# Patient Record
Sex: Male | Born: 1970
Health system: Southern US, Community
[De-identification: ages and names within clinical notes are randomized; demographics above are authoritative.]

## PROBLEM LIST (undated history)

## (undated) DIAGNOSIS — J45909 Unspecified asthma, uncomplicated: Secondary | ICD-10-CM

## (undated) DIAGNOSIS — I10 Essential (primary) hypertension: Secondary | ICD-10-CM

## (undated) DIAGNOSIS — E785 Hyperlipidemia, unspecified: Secondary | ICD-10-CM

## (undated) DIAGNOSIS — E669 Obesity, unspecified: Secondary | ICD-10-CM

## (undated) DIAGNOSIS — K200 Eosinophilic esophagitis: Secondary | ICD-10-CM

## (undated) DIAGNOSIS — E119 Type 2 diabetes mellitus without complications: Secondary | ICD-10-CM

## (undated) DIAGNOSIS — F419 Anxiety disorder, unspecified: Secondary | ICD-10-CM

## (undated) DIAGNOSIS — K760 Fatty (change of) liver, not elsewhere classified: Secondary | ICD-10-CM

## (undated) DIAGNOSIS — N189 Chronic kidney disease, unspecified: Secondary | ICD-10-CM

## (undated) DIAGNOSIS — Z8614 Personal history of Methicillin resistant Staphylococcus aureus infection: Secondary | ICD-10-CM

## (undated) HISTORY — DX: Type 2 diabetes mellitus without complications: E11.9

## (undated) HISTORY — DX: Eosinophilic esophagitis: K20.0

## (undated) HISTORY — PX: BACK SURGERY: SHX140

## (undated) HISTORY — DX: Essential (primary) hypertension: I10

## (undated) HISTORY — PX: OTHER SURGICAL HISTORY: SHX169

## (undated) HISTORY — DX: Fatty (change of) liver, not elsewhere classified: K76.0

## (undated) HISTORY — DX: Chronic kidney disease, unspecified: N18.9

## (undated) HISTORY — DX: Personal history of Methicillin resistant Staphylococcus aureus infection: Z86.14

## (undated) HISTORY — DX: Anxiety disorder, unspecified: F41.9

## (undated) HISTORY — DX: Obesity, unspecified: E66.9

## (undated) HISTORY — DX: Hyperlipidemia, unspecified: E78.5

## (undated) HISTORY — DX: Unspecified asthma, uncomplicated: J45.909

## (undated) HISTORY — PX: SHOULDER SURGERY: SHX246

## (undated) HISTORY — PX: ESOPHAGEAL DILATION: SHX303

---

## 2007-08-30 ENCOUNTER — Ambulatory Visit: Payer: Self-pay | Admitting: Family Medicine

## 2007-08-30 DIAGNOSIS — K222 Esophageal obstruction: Secondary | ICD-10-CM | POA: Insufficient documentation

## 2007-09-10 ENCOUNTER — Ambulatory Visit: Payer: Self-pay | Admitting: Gastroenterology

## 2007-09-17 ENCOUNTER — Ambulatory Visit: Payer: Self-pay | Admitting: Gastroenterology

## 2007-09-17 ENCOUNTER — Encounter: Payer: Self-pay | Admitting: Family Medicine

## 2007-09-17 ENCOUNTER — Encounter: Payer: Self-pay | Admitting: Gastroenterology

## 2007-10-05 ENCOUNTER — Ambulatory Visit: Payer: Self-pay | Admitting: Gastroenterology

## 2007-10-05 LAB — CONVERTED CEMR LAB
ALT: 80 units/L — ABNORMAL HIGH (ref 0–53)
AST: 47 units/L — ABNORMAL HIGH (ref 0–37)
Albumin: 4 g/dL (ref 3.5–5.2)
Alkaline Phosphatase: 64 units/L (ref 39–117)
BUN: 16 mg/dL (ref 6–23)
Basophils Absolute: 0.1 10*3/uL (ref 0.0–0.1)
Basophils Relative: 0.9 % (ref 0.0–1.0)
Bilirubin, Direct: 0.1 mg/dL (ref 0.0–0.3)
CO2: 30 meq/L (ref 19–32)
Calcium: 9.3 mg/dL (ref 8.4–10.5)
Chloride: 104 meq/L (ref 96–112)
Creatinine, Ser: 1.1 mg/dL (ref 0.4–1.5)
Eosinophils Absolute: 0.6 10*3/uL (ref 0.0–0.6)
Eosinophils Relative: 6.6 % — ABNORMAL HIGH (ref 0.0–5.0)
GFR calc Af Amer: 97 mL/min
GFR calc non Af Amer: 80 mL/min
Glucose, Bld: 143 mg/dL — ABNORMAL HIGH (ref 70–99)
HCT: 44.4 % (ref 39.0–52.0)
Hemoglobin: 15.1 g/dL (ref 13.0–17.0)
Lymphocytes Relative: 25.1 % (ref 12.0–46.0)
MCHC: 34 g/dL (ref 30.0–36.0)
MCV: 90.3 fL (ref 78.0–100.0)
Monocytes Absolute: 0.7 10*3/uL (ref 0.2–0.7)
Monocytes Relative: 7.4 % (ref 3.0–11.0)
Neutro Abs: 5.7 10*3/uL (ref 1.4–7.7)
Neutrophils Relative %: 60 % (ref 43.0–77.0)
Platelets: 325 10*3/uL (ref 150–400)
Potassium: 3.8 meq/L (ref 3.5–5.1)
RBC: 4.91 M/uL (ref 4.22–5.81)
RDW: 11.3 % — ABNORMAL LOW (ref 11.5–14.6)
Sodium: 141 meq/L (ref 135–145)
TSH: 1.61 microintl units/mL (ref 0.35–5.50)
Total Bilirubin: 0.6 mg/dL (ref 0.3–1.2)
Total Protein: 7.4 g/dL (ref 6.0–8.3)
WBC: 9.5 10*3/uL (ref 4.5–10.5)

## 2007-10-13 ENCOUNTER — Ambulatory Visit (HOSPITAL_COMMUNITY): Admission: RE | Admit: 2007-10-13 | Discharge: 2007-10-13 | Payer: Self-pay | Admitting: Gastroenterology

## 2007-10-29 ENCOUNTER — Encounter: Payer: Self-pay | Admitting: Family Medicine

## 2007-11-01 ENCOUNTER — Ambulatory Visit: Payer: Self-pay | Admitting: Family Medicine

## 2007-11-01 DIAGNOSIS — I10 Essential (primary) hypertension: Secondary | ICD-10-CM | POA: Insufficient documentation

## 2007-11-01 DIAGNOSIS — K7689 Other specified diseases of liver: Secondary | ICD-10-CM | POA: Insufficient documentation

## 2007-11-01 DIAGNOSIS — E669 Obesity, unspecified: Secondary | ICD-10-CM | POA: Insufficient documentation

## 2007-12-13 ENCOUNTER — Ambulatory Visit: Payer: Self-pay | Admitting: Family Medicine

## 2007-12-24 ENCOUNTER — Telehealth: Payer: Self-pay | Admitting: Family Medicine

## 2009-02-13 ENCOUNTER — Encounter: Payer: Self-pay | Admitting: Family Medicine

## 2009-02-23 ENCOUNTER — Ambulatory Visit: Payer: Self-pay | Admitting: Family Medicine

## 2009-02-28 ENCOUNTER — Ambulatory Visit: Payer: Self-pay | Admitting: Family Medicine

## 2009-03-01 ENCOUNTER — Telehealth: Payer: Self-pay | Admitting: Family Medicine

## 2009-03-01 ENCOUNTER — Encounter: Payer: Self-pay | Admitting: Family Medicine

## 2009-04-07 ENCOUNTER — Ambulatory Visit: Payer: Self-pay | Admitting: Family Medicine

## 2009-04-07 LAB — CONVERTED CEMR LAB
Glucose, Urine, Semiquant: NEGATIVE
Ketones, urine, test strip: NEGATIVE
Nitrite: NEGATIVE
Protein, U semiquant: 100
Rapid Strep: NEGATIVE
Specific Gravity, Urine: 1.025
Urobilinogen, UA: 1
WBC Urine, dipstick: NEGATIVE
pH: 6.5

## 2009-04-08 ENCOUNTER — Encounter: Payer: Self-pay | Admitting: Family Medicine

## 2009-09-10 ENCOUNTER — Ambulatory Visit: Payer: Self-pay | Admitting: Family Medicine

## 2009-11-01 ENCOUNTER — Ambulatory Visit: Payer: Self-pay | Admitting: Family Medicine

## 2009-11-01 DIAGNOSIS — S8010XA Contusion of unspecified lower leg, initial encounter: Secondary | ICD-10-CM | POA: Insufficient documentation

## 2010-02-21 ENCOUNTER — Ambulatory Visit: Payer: Self-pay | Admitting: Family Medicine

## 2010-02-21 DIAGNOSIS — S93409A Sprain of unspecified ligament of unspecified ankle, initial encounter: Secondary | ICD-10-CM | POA: Insufficient documentation

## 2010-03-10 ENCOUNTER — Ambulatory Visit: Payer: Self-pay | Admitting: Emergency Medicine

## 2010-03-10 DIAGNOSIS — L03319 Cellulitis of trunk, unspecified: Secondary | ICD-10-CM

## 2010-03-10 DIAGNOSIS — L02219 Cutaneous abscess of trunk, unspecified: Secondary | ICD-10-CM | POA: Insufficient documentation

## 2010-03-12 ENCOUNTER — Ambulatory Visit: Payer: Self-pay | Admitting: Emergency Medicine

## 2010-03-14 ENCOUNTER — Ambulatory Visit: Payer: Self-pay | Admitting: Family Medicine

## 2010-03-14 ENCOUNTER — Telehealth (INDEPENDENT_AMBULATORY_CARE_PROVIDER_SITE_OTHER): Payer: Self-pay | Admitting: *Deleted

## 2010-03-15 ENCOUNTER — Encounter: Payer: Self-pay | Admitting: Family Medicine

## 2010-05-28 ENCOUNTER — Ambulatory Visit: Payer: Self-pay | Admitting: Family Medicine

## 2010-05-28 DIAGNOSIS — M20019 Mallet finger of unspecified finger(s): Secondary | ICD-10-CM | POA: Insufficient documentation

## 2010-05-30 ENCOUNTER — Encounter: Admission: RE | Admit: 2010-05-30 | Discharge: 2010-05-30 | Payer: Self-pay | Admitting: Sports Medicine

## 2010-05-30 ENCOUNTER — Encounter: Payer: Self-pay | Admitting: Family Medicine

## 2010-06-18 ENCOUNTER — Telehealth: Payer: Self-pay | Admitting: Family Medicine

## 2010-08-21 ENCOUNTER — Ambulatory Visit: Admit: 2010-08-21 | Payer: Self-pay | Admitting: Family Medicine

## 2010-08-23 ENCOUNTER — Ambulatory Visit
Admission: RE | Admit: 2010-08-23 | Discharge: 2010-08-23 | Payer: Self-pay | Source: Home / Self Care | Attending: Family Medicine | Admitting: Family Medicine

## 2010-08-23 DIAGNOSIS — L03119 Cellulitis of unspecified part of limb: Secondary | ICD-10-CM

## 2010-08-23 DIAGNOSIS — L02419 Cutaneous abscess of limb, unspecified: Secondary | ICD-10-CM | POA: Insufficient documentation

## 2010-08-24 ENCOUNTER — Encounter: Payer: Self-pay | Admitting: Family Medicine

## 2010-09-17 NOTE — Assessment & Plan Note (Signed)
Summary: CPE   Vital Signs:  Patient profile:   40 year old male Height:      67.25 inches Weight:      253 pounds BMI:     39.47 O2 Sat:      100 % on Room air Temp:     98.4 degrees F oral Pulse rate:   63 / minute BP sitting:   139 / 91  (left arm) Cuff size:   regular  Vitals Entered By: Payton Spark CMA (September 10, 2009 3:34 PM)  O2 Flow:  Room air CC: CPE   Primary Care Provider:  Seymour Bars DO  CC:  CPE.  History of Present Illness: 40 yo WM with hx of depression presents for CPE.  Doing well.  Mood stable on Zoloft.  Due for a RF.  He has just started to work on healthy diet and exercise, down 7 lbs this month.  He is not on BP meds.  He is due for fasting labs.  Denies fam hx of premature heart dz, colon or prostate cancer.  he is taking [employer] Men's Mega MVI daily.    Drinks 4-5 caffienated drinks/day. Only drinks diet soda.  He drinks a 6pk of beer 2-3/week.  He identifies his job as stressful and would like to have xanax as needed for anxiety.  He reports a Tick bite 4 mos ago while walking through the woods. Several days later he discovered the head still under his skin. He removed the tick but is still concerned about an Itchy area on his left side.  He reports no rash.  No fevers or joint pain.    Current Medications (verified): 1)  Zoloft 100 Mg  Tabs (Sertraline Hcl) .... Take 1 Tablet By Mouth Once A Day  Allergies (verified): 1)  ! Pcn  Past History:  Past Medical History: Reviewed history from 04/07/2009 and no changes required. anxiety/ anger problems HTN (off meds) obesity fatty liver (2-09 ) + [place] eosinophilic esophagitis recurrent esophageal stricture history of MRSA - rt arm  Past Surgical History: Reviewed history from 11/01/2007 and no changes required. R ankle ORIF 1995 EGD ([facility] 1-09), stricture dilatation  Family History: Reviewed history from 08/30/2007 and no changes required. mother alive- HTN, stroke at 70 father  alive, HTN 2 sisters obese 1 brother healthy  Social History: Reviewed history from 12/13/2007 and no changes required. 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 Finished 2 yrs college. Never smoked. 6 pack beer 2-3x/ wk.-- cutting back No exercise.  Review of Systems       The patient complains of weight gain.  The patient denies anorexia, fever, weight loss, vision loss, decreased hearing, hoarseness, chest pain, syncope, dyspnea on exertion, peripheral edema, prolonged cough, headaches, hemoptysis, abdominal pain, melena, hematochezia, severe indigestion/heartburn, hematuria, incontinence, genital sores, muscle weakness, suspicious skin lesions, transient blindness, difficulty walking, depression, unusual weight change, abnormal bleeding, enlarged lymph nodes, angioedema, breast masses, and testicular masses.    Physical Exam  General:  alert, well-developed, cooperative to examination, and overweight-appearing.   Head:  normocephalic and atraumatic.   Eyes:  vision grossly intact, pupils equal, pupils round, and pupils reactive to light.   Ears:  EACs patent; TMs translucent and gray with good cone of light and bony landmarks.   Nose:  no external deformity.   Mouth:  good dentition, pharynx pink and moist, no erythema, no exudates, no posterior lymphoid hypertrophy, and no postnasal drip.   Neck:  supple, full ROM, and no masses.   Chest Wall:  no deformities, no tenderness, and no masses.   Lungs:  normal respiratory effort, no intercostal retractions, no accessory muscle use, normal breath sounds, no crackles, and no wheezes.   Heart:  normal rate, regular rhythm, and no murmur.   Abdomen:  soft, non-tender, normal bowel sounds, no guarding, and no rigidity.   Pulses:  R radial normal, R dorsalis pedis normal, R carotid normal, L radial normal, L dorsalis pedis normal, and L carotid normal.   Extremities:  trace ankle edema bilat Neurologic:  alert &  oriented X3 and gait normal.   Skin:  color normal.    ~0.5 cm erythematous insect bite area over L flank.  Nontender.  No edema.  scaling.   Cervical Nodes:  No lymphadenopathy noted Psych:  Oriented X3, normally interactive, good eye contact, not anxious appearing, and not depressed appearing.     Impression & Recommendations:  Problem # 1:  HEALTH MAINTENANCE EXAM (ICD-V70.0) Keeping healthy checklist for men reviewed.   BP high today.  Will have him work on diet, exercise and wt loss and recheck in 3 mos.  If still high, add medication. BMI 39 c/w class II obesity.  Work on diet,exercise, wt loss. Update fasting labs.  Tdap is UTD. Zoloft RFd with prn Alprazolam RX given. Local wound care for tick bite area.  No sign of infection locally or systemically.    Complete Medication List: 1)  Zoloft 100 Mg Tabs (Sertraline hcl) .... Take 1 tablet by mouth once a day 2)  Alprazolam 0.5 Mg Tabs (Alprazolam) .... 1/2 to 1 tab by mouth daily as needed for anxiety  Other Orders: T-Comprehensive Metabolic Panel [id number]) T-Lipid Profile ([id number])  Patient Instructions: 1)  Zoloft RFd. 2)  Alprazolam - use sparingly for anxiety.  Do not combine with alcohol. 3)  Have fasting labs updated. 4)  Will call you w/ results. 5)  Work on [employer], regular exercise and wt loss. 6)  Clean tick bite area with dial soap and water daily, apply neosporin to area daily. 7)  Return for f/u in 3 mos. Prescriptions: ALPRAZOLAM 0.5 MG TABS (ALPRAZOLAM) 1/2 to 1 tab by mouth daily as needed for anxiety  #30 x 0   Entered and Authorized by:   Seymour Bars DO   Signed by:   Seymour Bars DO on 09/10/2009   Method used:   Printed then faxed to ...       [address] [phone]* (retail)       [address] [place], [place]  [address]       Ph: [phone]       Fax: [phone]   RxID:   [id number] ZOLOFT 100 MG  TABS (SERTRALINE HCL) Take 1 tablet by mouth once a day  #30 x 5   Entered  and Authorized by:   Seymour Bars DO   Signed by:   Seymour Bars DO on 09/10/2009   Method used:   Electronically to        [facility] [phone]* (retail)       [address] [place], [place]  [address]       Ph: [phone]       Fax: [phone]   RxID:   [id number]

## 2010-09-17 NOTE — Assessment & Plan Note (Signed)
Summary: I&D   Vital Signs:  Patient profile:   39 year old male Height:      67.25 inches Weight:      254 pounds Pulse rate:   85 / minute BP sitting:   142 / 91  (left arm) Cuff size:   regular  Vitals Entered By: Avon Gully CMA, Duncan Dull) (March 14, 2010 2:27 PM) CC: Cyst on rt flank   Primary Care Provider:  Seymour Bars DO  CC:  Cyst on rt flank.  History of Present Illness: 6 days started. Put on Bactrim 4 a day.  Told likely needs I & D if not improved by Today. Says the redness had extended out several inches and that has improved since being on the ABX. Was drainaing initially but has stopped.  No fever. No other lesions. Prior hx of MRSA.   Current Medications (verified): 1)  Zoloft 100 Mg  Tabs (Sertraline Hcl) .... Take 1 Tablet By Mouth Once A Day 2)  Bactrim Ds 800-160 Mg Tabs (Sulfamethoxazole-Trimethoprim) .... 2 Tabs By Mouth Two Times A Day X 10 Days 3)  Bactroban Nasal 2 % Oint (Mupirocin Calcium) .... Apply Daily To Both Nares For 1 Week  Allergies (verified): 1)  ! Pcn  Comments:  Nurse/Medical Assistant: The patient's medications and allergies were reviewed with the patient and were updated in the Medication and Allergy Lists. Avon Gully CMA, Duncan Dull) (March 14, 2010 2:28 PM)  Physical Exam  General:  Well-developed,well-nourished,in no acute distress; alert,appropriate and cooperative throughout examination Skin:  Right flank with 3 cm indurated abscess. No active drianage. During I & D expressed copious yellow pus and was cultured.    Impression & Recommendations:  Problem # 1:  CELLULITIS AND ABSCESS OF TRUNK (ICD-682.2) complete ABX. Culture sent F/U wond care reviewed.  Pt tolerated procedure well.  Hydrocodone given for pain.  His updated medication list for this problem includes:    Bactrim Ds 800-160 Mg Tabs (Sulfamethoxazole-trimethoprim) .Marland Kitchen... 2 tabs by mouth two times a day x 10 days  Orders: T-Culture, Wound  ([id number]) I&D Abscess, Simple / Single ([id number])  Complete Medication List: 1)  Zoloft 100 Mg Tabs (Sertraline hcl) .... Take 1 tablet by mouth once a day 2)  Bactrim Ds 800-160 Mg Tabs (Sulfamethoxazole-trimethoprim) .... 2 tabs by mouth two times a day x 10 days 3)  Bactroban Nasal 2 % Oint (Mupirocin calcium) .... Apply daily to both nares for 1 week 4)  Hydrocodone-acetaminophen 5-500 Mg Tabs (Hydrocodone-acetaminophen) .Marland Kitchen.. 1-2 tabs by mouth every 4-6 hours as needed  Patient Instructions: 1)  Complete all the antibiotic 2)  Pull 1/2- 1 inch of gauze a day. Keep covered. Can apply vaseline to keep the bandage from sticking to the gauze.   3)  Call if redness increase or poor wound healing.  4)  F/U in 10 days to make sure healing well.   Procedure Note  Incision & Drainage: Onset of lesion: 6 days Indication: inflamed lesion  Procedure # 1: I & D with packing    Size (in cm): 3 .0 x 3.0    Region: right flank     Instrument used: #10 blade    Anesthesia: 1% lidocaine w/epinephrine  Cleaned and prepped with: alcohol and betadine Wound dressing: bulky gauze dressing Additional Instructions: wound packed wiht 1/4 inch iodoform.   Prescriptions: HYDROCODONE-ACETAMINOPHEN 5-500 MG TABS (HYDROCODONE-ACETAMINOPHEN) 1-2 tabs by mouth every 4-6 hours as needed  #40 x 0   Entered and Authorized by:  Nani Gasser MD   Signed by:   Nani Gasser MD on 03/14/2010   Method used:   Print then Give to Patient   RxID:   [id number]

## 2010-09-17 NOTE — Progress Notes (Signed)
Summary: Alprazolam refill  Phone Note Refill Request   Refills Requested: Medication #1:  Alprazolam 0.5mg  Take 1/2 -1 tab po qd prn anxiety   Last Refilled: 09/14/2009 Removed from med list by Sue Lush in July but now got refill request.  Initial call taken by: Payton Spark CMA,  June 18, 2010 10:40 AM    New/Updated Medications: ALPRAZOLAM 0.5 MG TABS (ALPRAZOLAM) 1 tab by mouth once daily as needed for anxiety Prescriptions: ALPRAZOLAM 0.5 MG TABS (ALPRAZOLAM) 1 tab by mouth once daily as needed for anxiety  #24 x 0   Entered and Authorized by:   Seymour Bars DO   Signed by:   Seymour Bars DO on 06/18/2010   Method used:   Printed then faxed to ...       [address] [phone]* (retail)       [address] [place], [place]  [address]       Ph: [phone]       Fax: [phone]   RxID:   [id number]   Appended Document: Alprazolam refill Faxed to [facility]

## 2010-09-17 NOTE — Progress Notes (Signed)
Summary: MRSA  Phone Note Call from Patient   Caller: [phone] Summary of Call: Pt states he has been seem in [facility] 7/24 and 7/26 for MRSA. Pt states UC would not do I&D and Pt feels that is what he needs. Pt is going out of town and really wants this looked at by Dr. B or Dr. Judie Petit. I explained to Pt that there are no available apts and if UC didn't think this needed to be drained then most likely it doesn't need to be drained. Pt insisted that I speak w/ both doctors and find out what can be done for him. Please advise. Initial call taken by: Payton Spark CMA,  March 14, 2010 11:43 AM  Follow-up for Phone Call        If UC felt it didn't need to be drained, it was probably becuase it was too soft.  Is he at least on antibiotics? Follow-up by: Seymour Bars DO,  March 14, 2010 11:52 AM  Additional Follow-up for Phone Call Additional follow up Details #1::        He is on ABX but states that the boil is no longer soft and needs drained. Additional Follow-up by: Payton Spark CMA,  March 14, 2010 12:02 PM    Additional Follow-up for Phone Call Additional follow up Details #2::    Can double book at 2:30.  Follow-up by: Nani Gasser MD,  March 14, 2010 12:08 PM  Additional Follow-up for Phone Call Additional follow up Details #3:: Details for Additional Follow-up Action Taken: Pt aware of the above Additional Follow-up by: Payton Spark CMA,  March 14, 2010 12:08 PM

## 2010-09-17 NOTE — Assessment & Plan Note (Signed)
Summary: R ankle sprain   Vital Signs:  Patient profile:   40 year old male Height:      67.25 inches Weight:      249 pounds BMI:     38.85 O2 Sat:      100 % on Room air Pulse rate:   67 / minute BP sitting:   149 / 80  (left arm) Cuff size:   large  Vitals Entered By: Payton Spark CMA (February 21, 2010 11:38 AM)  O2 Flow:  Room air CC: Twisted R ankle x 4 days. Also c/o pinched nerve in neck x months.   Primary Care Provider:  Seymour Bars DO  CC:  Twisted R ankle x 4 days. Also c/o pinched nerve in neck x months..  History of Present Illness: 40 yo WM presents for R ankle pain 4 days ago.  He was on a ramp and stepped into a hole.  He has a metal plate in their.  He had pain with weight bearing which has improved.  He has throbbing even when it turns outward.  He had some swelling.  He has been taking Ibuprofen.  He has been trying to elevate it some.   He has has pain in his neck when he turns his neck to the R or L.  The pain just shoots down the C spine.  Denies tingling or numbness in the arm.  Denies HAs.  Has an appt with his chiropractor tomorrow.       Current Medications (verified): 1)  Zoloft 100 Mg  Tabs (Sertraline Hcl) .... Take 1 Tablet By Mouth Once A Day 2)  Alprazolam 0.5 Mg Tabs (Alprazolam) .... 1/2 To 1 Tab By Mouth Daily As Needed For Anxiety  Allergies (verified): 1)  ! Pcn  Past History:  Past Surgical History: Reviewed history from 11/01/2007 and no changes required. R ankle ORIF 1995 EGD ([facility] 1-09), stricture dilatation  Social History: Reviewed history from 09/10/2009 and no changes required. 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 Finished 2 yrs college. Never smoked. 6 pack beer 2-3x/ wk.-- cutting back No exercise.  Review of Systems      See HPI  Physical Exam  General:  alert, well-developed, well-nourished, and well-hydrated.   Msk:  full C and glenohumeral ROM, grip + 5/5 tender over the R  lateral malleolus with soft tissue edema, no brusing or redness.  Surgically fixed over fibulotalar joint.  Nontender over syndesmosis or 5th metatarsal.  antalgic gait.   Pulses:  2+ bilat pedal pulses   Impression & Recommendations:  Problem # 1:  ANKLE SPRAIN, RIGHT (ICD-845.00) Able to bear weight (with improvement ) in just 2 days  after an R ankle inversion injury c/w an inversion ankle sprain.  Treat with ASO brace, ice, elevation and relative rest.  Use Advil as needed.  Will keep an eye on his BP which was high today.    Call if not improved in 2 wks. Orders: ASO Ankle brace ([id number])  Complete Medication List: 1)  Zoloft 100 Mg Tabs (Sertraline hcl) .... Take 1 tablet by mouth once a day 2)  Alprazolam 0.5 Mg Tabs (Alprazolam) .... 1/2 to 1 tab by mouth daily as needed for anxiety  Patient Instructions: 1)  Treat R ankle sprain with ASO brace during the day x 10 days. 2)  Elevate foot, use ice on and off. 3)  Take Advil 3 tabs (600 mg) 3 x a day with  food for the next wk for pain and inflammation. 4)  Avoid prolonged walking for the next 10 days. 5)  Call if not improved after 2 wks.

## 2010-09-17 NOTE — Letter (Signed)
Summary: Out of Work  [facility]  [address]   [place], [place] [address]   Phone: [phone]  Fax: [phone]    February 21, 2010   Employee:  [employer]ILL [employer]    To Whom It May Concern:   For Medical reasons, please excuse the above named employee from work for the following dates:  Start:   July 7th - 8th  End:   July 9th  If you need additional information, please feel free to contact our office.         Sincerely,    Seymour Bars DO

## 2010-09-17 NOTE — Assessment & Plan Note (Signed)
Summary: mallet finger   Vital Signs:  Patient profile:   40 year old male Height:      67.25 inches Weight:      251 pounds BMI:     39.16 O2 Sat:      97 % on Room air Pulse rate:   86 / minute BP sitting:   135 / 83  (left arm) Cuff size:   large  Vitals Entered By: Payton Spark CMA (May 28, 2010 3:22 PM)  O2 Flow:  Room air CC: ? Capel tunnel syndrome.R 4th digit stiff.   Primary Care Stuti Sandin:  Seymour Bars DO  CC:  ? Capel tunnel syndrome.R 4th digit stiff..  History of Present Illness: 40 yo WM presents for pain in the 4th digit for 1 wk.  Denies any direct trauma or swelling.  He has lost full extension at the DIP joint.    Current Medications (verified): 1)  Zoloft 100 Mg  Tabs (Sertraline Hcl) .... Take 1 Tablet By Mouth Once A Day  Allergies (verified): 1)  ! Pcn  Review of Systems      See HPI  Physical Exam  General:  alert, well-developed, well-nourished, and well-hydrated.   Msk:  loss of active extension at the DIP of the 4th digit, R hand.  no edema or redness Pulses:  2+ radial and ulnar pulses Neurologic:  grip + 5/5   Impression & Recommendations:  Problem # 1:  MALLET FINGER, ACQUIRED (ICD-736.1) Referral made to ortho for mallet finger. Orders: Orthopedic Surgeon Referral (Ortho Surgeon)  Complete Medication List: 1)  Zoloft 100 Mg Tabs (Sertraline hcl) .... Take 1 tablet by mouth once a day  Patient Instructions: 1)  Ortho referral made for mallet finger.

## 2010-09-17 NOTE — Consult Note (Signed)
Summary: [facility]  [facility]   Imported By: Lanelle Bal 06/13/2010 08:23:31  _____________________________________________________________________  External Attachment:    Type:   Image     Comment:   External Document

## 2010-09-17 NOTE — Assessment & Plan Note (Signed)
Summary: FOLLOW UP ON MRSA ON SIDE/TJ   Vital Signs:  Patient Profile:   40 Years Old Male CC:      f/u for MRSA on back Height:     67.25 inches Weight:      255 pounds O2 Sat:      99 % O2 treatment:    Room Air Temp:     97.7 degrees F oral Pulse rate:   81 / minute Resp:     18 per minute BP sitting:   145 / 85  (right arm) Cuff size:   large  Pt. in pain?   no  Vitals Entered By: Lajean Saver RN (March 12, 2010 5:04 PM)                   Updated Prior Medication List: ZOLOFT 100 MG  TABS (SERTRALINE HCL) Take 1 tablet by mouth once a day ALPRAZOLAM 0.5 MG TABS (ALPRAZOLAM) 1/2 to 1 tab by mouth daily as needed for anxiety BACTRIM DS 800-160 MG TABS (SULFAMETHOXAZOLE-TRIMETHOPRIM) 2 tabs by mouth two times a day x 10 days BACTROBAN NASAL 2 % OINT (MUPIROCIN CALCIUM) apply daily to both nares for 1 week  Current Allergies (reviewed today): ! PCNHistory of Present Illness Chief Complaint: f/u for MRSA on back History of Present Illness: f/u MRSA of right flank.  He thinks it is getting better.  He is here with his son who is getting a sports physical today, so just wanted to see if it needed to be I&D'd yet.  Firm, red, still painful.  During his job, he frequenly has to lay down and hits that area, so it is still tender.  He is leaving for a trip on Thursday and wants to make sure it is ok.  Still taking the Bactrim as prescribed.  Not taking any pain meds.  He is using warms pads on the area as well.  REVIEW OF SYSTEMS Constitutional Symptoms      Denies fever, chills, night sweats, weight loss, weight gain, and fatigue.  Eyes       Denies change in vision, eye pain, eye discharge, glasses, contact lenses, and eye surgery. Ear/Nose/Throat/Mouth       Denies hearing loss/aids, change in hearing, ear pain, ear discharge, dizziness, frequent runny nose, frequent nose bleeds, sinus problems, sore throat, hoarseness, and tooth pain or bleeding.  Respiratory       Denies  dry cough, productive cough, wheezing, shortness of breath, asthma, bronchitis, and emphysema/COPD.  Cardiovascular       Denies murmurs, chest pain, and tires easily with exhertion.    Gastrointestinal       Denies stomach pain, nausea/vomiting, diarrhea, constipation, blood in bowel movements, and indigestion. Genitourniary       Denies painful urination, kidney stones, and loss of urinary control. Neurological       Denies paralysis, seizures, and fainting/blackouts. Musculoskeletal       Denies muscle pain, joint pain, joint stiffness, decreased range of motion, redness, swelling, muscle weakness, and gout.  Skin       Complains of unusual moles/lumps or sores.      Denies bruising and hair/skin or nail changes.  Psych       Denies mood changes, temper/anger issues, anxiety/stress, speech problems, depression, and sleep problems.  Past History:  Past Medical History: Reviewed history from 04/07/2009 and no changes required. anxiety/ anger problems HTN (off meds) obesity fatty liver (2-09 ) + [place] eosinophilic esophagitis recurrent esophageal stricture  history of MRSA - rt arm  Past Surgical History: Reviewed history from 11/01/2007 and no changes required. R ankle ORIF 1995 EGD ([facility] 1-09), stricture dilatation  Family History: Reviewed history from 08/30/2007 and no changes required. mother alive- HTN, stroke at 59 father alive, HTN 2 sisters obese 1 brother healthy  Social History: Reviewed history from 09/10/2009 and no changes required. 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 Never smoked. 6 pack beer 2-3x/ wk.-- cutting back No exercise. Physical Exam General appearance: well developed, well nourished, no acute distress Skin: right flank with firm abscess and surrounding erythema extending 1-2cm from the middle (much better than 2 days ago).  Firm, no induration or fluctuance.  Mild tenderness. No  draiange. Assessment Cellulitis/abscesss of right torso  Plan New Orders: Est. Patient Level I [29562] Planning Comments:   Continue the Bactrim (2 piills twice a day for the MRSA dose) since it seems to be working It is not ready to be lanced yet, still very firm Continue heating pad Ibuprofen for pain Come back if it gets soft and if it needs to be lanced.  However it may never happen and will probably heal up very nicely.   The patient and/or caregiver has been counseled thoroughly with regard to medications prescribed including dosage, schedule, interactions, rationale for use, and possible side effects and they verbalize understanding.  Diagnoses and expected course of recovery discussed and will return if not improved as expected or if the condition worsens. Patient and/or caregiver verbalized understanding.   Orders Added: 1)  Est. Patient Level I [13086]

## 2010-09-17 NOTE — Assessment & Plan Note (Signed)
Summary: Bite - R side x 3 dys rm 2   Vital Signs:  Patient Profile:   40 Years Old Male CC:      Bite - R side x 3dys Height:     67.25 inches Weight:      251 pounds O2 Sat:      100 % O2 treatment:    Room Air Temp:     97.3 degrees F oral Pulse rate:   79 / minute Pulse rhythm:   regular Resp:     18 per minute BP sitting:   139 / 83  (right arm) Cuff size:   regular  Vitals Entered By: Areta Haber CMA (March 10, 2010 1:28 PM)                  Current Allergies: ! PCNHistory of Present Illness Chief Complaint: Bite - R side x 3dys History of Present Illness: ?Bite on his right flank 3 days ago.  He and his family have a history of MRSA infections and he says this is similar.  Redness is spreading and so is dull pain and swelling.  Has used Hibaclens but no other meds.  No fever, chills, N/V.  Current Problems: CELLULITIS AND ABSCESS OF TRUNK (ICD-682.2) ANKLE SPRAIN, RIGHT (ICD-845.00) CONTUSION OF LOWER LEG (ICD-924.10) HEALTH MAINTENANCE EXAM (ICD-V70.0) SCREENING FOR LIPOID DISORDERS (ICD-V77.91) OTH&UNSPEC ENDOCRN NUTRIT METAB&IMMUNITY D/O (ICD-V77.99) OBESITY (ICD-278.00) FATTY LIVER DISEASE (ICD-571.8) ESSENTIAL HYPERTENSION, BENIGN (ICD-401.1) ESOPHAGEAL STRICTURE (ICD-530.3)   Current Meds ZOLOFT 100 MG  TABS (SERTRALINE HCL) Take 1 tablet by mouth once a day ALPRAZOLAM 0.5 MG TABS (ALPRAZOLAM) 1/2 to 1 tab by mouth daily as needed for anxiety BACTRIM DS 800-160 MG TABS (SULFAMETHOXAZOLE-TRIMETHOPRIM) 2 tabs by mouth two times a day x 10 days BACTROBAN NASAL 2 % OINT (MUPIROCIN CALCIUM) apply daily to both nares for 1 week  REVIEW OF SYSTEMS Constitutional Symptoms      Denies fever, chills, night sweats, weight loss, weight gain, and fatigue.  Eyes       Denies change in vision, eye pain, eye discharge, glasses, contact lenses, and eye surgery. Ear/Nose/Throat/Mouth       Denies hearing loss/aids, change in hearing, ear pain, ear discharge,  dizziness, frequent runny nose, frequent nose bleeds, sinus problems, sore throat, hoarseness, and tooth pain or bleeding.  Respiratory       Denies dry cough, productive cough, wheezing, shortness of breath, asthma, bronchitis, and emphysema/COPD.  Cardiovascular       Denies murmurs, chest pain, and tires easily with exhertion.    Gastrointestinal       Denies stomach pain, nausea/vomiting, diarrhea, constipation, blood in bowel movements, and indigestion. Genitourniary       Denies painful urination, kidney stones, and loss of urinary control. Neurological       Denies paralysis, seizures, and fainting/blackouts. Musculoskeletal       Denies muscle pain, joint pain, joint stiffness, decreased range of motion, redness, swelling, muscle weakness, and gout.  Skin       Denies bruising, unusual mles/lumps or sores, and hair/skin or nail changes.      Comments: Bite - R side x 3 dys Psych       Denies mood changes, temper/anger issues, anxiety/stress, speech problems, depression, and sleep problems. Other Comments: Pt has not sen PCP for this.   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 Finished 2 yrs college. Never smoked. 6 pack beer 2-3x/ wk.-- cutting back No exercise.  Risk Factors: Caffeine Use: 4 (08/30/2007)  Risk Factors: Smoking Status: never (08/30/2007) Physical Exam General appearance: well developed, well nourished, no acute  distress Head: normocephalic, atraumatic Chest/Lungs: no rales, wheezes, or rhonchi bilateral, breath sounds equal without effort Heart: regular rate and  rhythm, no murmur Skin: right flank with abscess and surrounding erythema extending 4-5cm from the middle.  Firm, no induration or fluctuance.  Mild tenderness. Assessment New Problems: CELLULITIS AND ABSCESS OF TRUNK (ICD-682.2)   Patient Education: Patient and/or caregiver instructed in the following: fluids, Ibuprofen prn.  Plan New Medications/Changes: BACTROBAN NASAL 2 % OINT (MUPIROCIN CALCIUM) apply daily to both nares for 1 week  #1 tube x 0, 03/10/2010, Hoyt Koch MD BACTRIM DS 800-160 MG TABS (SULFAMETHOXAZOLE-TRIMETHOPRIM) 2 tabs by mouth two times a day x 10 days  #40 x 0, 03/10/2010, Hoyt Koch MD  New Orders: New Patient Level II [id number] Planning Comments:   Heating pad a few times a day Use Hibaclens and Bactroban to try to eliminate suspected MRSA If worsening redness, fever, pain, Follow-up with your primary care physician   The patient and/or caregiver has been counseled thoroughly with regard to medications prescribed including dosage, schedule, interactions, rationale for use, and possible side effects and they verbalize understanding.  Diagnoses and expected course of recovery discussed and will return if not improved as expected or if the condition worsens. Patient and/or caregiver verbalized understanding.  Prescriptions: BACTROBAN NASAL 2 % OINT (MUPIROCIN CALCIUM) apply daily to both nares for 1 week  #1 tube x 0   Entered and Authorized by:   Hoyt Koch MD   Signed by:   Hoyt Koch MD on 03/10/2010   Method used:   Printed then faxed to ...       [address] [phone]* (retail)       [address] [place], [place]  [address]       Ph: [phone]       Fax: [phone]   RxID:   [id number] BACTRIM DS 800-160 MG TABS (SULFAMETHOXAZOLE-TRIMETHOPRIM) 2 tabs by mouth  two times a day x 10 days  #40 x 0   Entered and Authorized by:   Hoyt Koch MD   Signed by:   Hoyt Koch MD on 03/10/2010   Method used:   Printed then faxed to ...       [address] [phone]* (retail)       [address] [place], [place]  [address]       Ph: [phone]       Fax: [phone]   RxID:   [id number]   Orders Added: 1)  New Patient Level II [00938]
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
## 2010-09-19 NOTE — Assessment & Plan Note (Signed)
Summary: ? MRSA   Vital Signs:  Patient profile:   40 year old male Height:      67.25 inches Weight:      255 pounds BMI:     39.79 O2 Sat:      97 % on Room air Temp:     98.0 degrees F oral Pulse rate:   79 / minute BP sitting:   145 / 90  (left arm) Cuff size:   large  Vitals Entered By: Payton Spark CMA (August 23, 2010 11:21 AM)  O2 Flow:  Room air CC: ? MRSA L lower leg x 1 week.   Primary Care Provider:  Seymour Bars DO  CC:  ? MRSA L lower leg x 1 week.Marland Kitchen  History of Present Illness: 40 yo WM presents as a scratch on his L lower leg after scratching from wearing wool socks.  This came up 1 wk ago.  He drained it and started some left over oral abx and some topcial bactroban which did help.  The pain and redness has improved but it is still hard.  He has had MRSA in the past. No F/c or other lesions  Current Medications (verified): 1)  Zoloft 100 Mg  Tabs (Sertraline Hcl) .... Take 1 Tablet By Mouth Once A Day 2)  Alprazolam 0.5 Mg Tabs (Alprazolam) .Marland Kitchen.. 1 Tab By Mouth Once Daily As Needed For Anxiety  Allergies (verified): 1)  ! Pcn  Past History:  Past Medical History: Reviewed history from 04/07/2009 and no changes required. anxiety/ anger problems HTN (off meds) obesity fatty liver (2-09 ) + [place] eosinophilic esophagitis recurrent esophageal stricture history of MRSA - rt arm  Social History: Reviewed history from 09/10/2009 and no changes required. 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 Finished 2 yrs college. Never smoked. 6 pack beer 2-3x/ wk.-- cutting back No exercise.  Review of Systems      See HPI  Physical Exam  General:  alert, well-developed, well-nourished, well-hydrated, and overweight-appearing.   Skin:  1.4 cm round raised abscess, hard and indurated over the lateral LLE close to ankle joint.  Not draining. scaley.   Psych:  good eye contact, not anxious appearing, and not depressed appearing.      Impression & Recommendations:  Problem # 1:  CELLULITIS AND ABSCESS OF LEG EXCEPT FOOT (ICD-682.6) Likely recurrent MRSA secondary to self inflicted scratch. Partially treated thus far with 4 days of 'left over' bactrim.  Will add 6 more days, clean with soap and water and cover wtih bactroban ointment (has at home) and a bandaid.  Call if not resolved in 6 more days or other symptoms develop. His updated medication list for this problem includes:    Sulfamethoxazole-trimethoprim 800-160 Mg/54ml Susp (Sulfamethoxazole-trimethoprim) .Marland Kitchen... 2 tabs by mouth two times a day x 6 days  Orders: T-Culture,Abcess ([id number])  Complete Medication List: 1)  Zoloft 100 Mg Tabs (Sertraline hcl) .... Take 1 tablet by mouth once a day 2)  Alprazolam 0.5 Mg Tabs (Alprazolam) .Marland Kitchen.. 1 tab by mouth once daily as needed for anxiety 3)  Sulfamethoxazole-trimethoprim 800-160 Mg/16ml Susp (Sulfamethoxazole-trimethoprim) .... 2 tabs by mouth two times a day x 6 days  Patient Instructions: 1)  Keep spot clean with antibacterial soap and water 2 x a day. 2)  Cover with bactroban ointment and a bandaid for the next 10 days. 3)  Take 6 days of generic bactrim ds - 2 with breakfast and 2 with dinner. 4)  Call if not resolved in 6 more days. Prescriptions: ALPRAZOLAM 0.5 MG TABS (ALPRAZOLAM) 1 tab by mouth once daily as needed for anxiety  #30 x 0   Entered and Authorized by:   Seymour Bars DO   Signed by:   Seymour Bars DO on 08/23/2010   Method used:   Printed then faxed to ...       [address] [phone]* (retail)       [address] [place], [place]  [address]       Ph: [phone]       Fax: [phone]   RxID:   [id number] SULFAMETHOXAZOLE-TRIMETHOPRIM 800-160 MG/20ML SUSP (SULFAMETHOXAZOLE-TRIMETHOPRIM) 2 tabs by mouth two times a day x 6 days  #24 x 0   Entered and Authorized by:   Seymour Bars DO   Signed by:   Seymour Bars DO on 08/23/2010   Method used:   Electronically to         [facility] [phone]* (retail)       [address] [place], [place]  [address]       Ph: [phone]       Fax: [phone]   RxID:   [id number]    Orders Added: 1)  T-Culture,Abcess [[id number]] 2)  Est. Patient Level III [32355]

## 2010-10-11 ENCOUNTER — Ambulatory Visit: Payer: Self-pay | Admitting: Family Medicine

## 2010-12-31 NOTE — Assessment & Plan Note (Signed)
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  I previous did  endoscopy on him at the request of Dr. Cathey Endow for dysphagia evaluation on  September 17, 2007.  He had rather classic endoscopic findings of  eosinophilic esophagitis confirmed by esophageal biopsies.  He also had  some nonspecific erosive gastroduodenitis, most consistent with NSAID  damage, although the patient continues to deny NSAID use.  CLO biopsy  and gastric biopsies for Helicobacter were negative.   Erlin denies any chronic GI complaints, dyspepsia or reflux symptoms.  He has had intermittent dysphagia over the years and has been  asymptomatic since his endoscopy and dilatation on September 17, 2007.  At  that time he was dilated with #52 Jerene Dilling dilator.  He has been  taking Prevacid 30 mg because of his erosive gastroduodenitis.   PAST MEDICAL HISTORY:  His past medical history is otherwise  noncontributory.   MEDICATIONS:  1. Zoloft 100 mg a day for chronic depression.  2. Prevacid 30 mg a day.   ALLERGIES:  He does have a history of PENICILLIN ALLERGY.   FAMILY HISTORY:  Noncontributory.   SOCIAL HISTORY:  He is married, lives with his wife and children.  He  does not smoke but uses up to a case of beer a week.  He denies problems  with alcohol abuse and his wife is not concerned.   REVIEW OF SYSTEMS:  Otherwise noncontributory without any history of  abdominal pain, known hepatitis, pancreatitis or other gastrointestinal  or other general medical problems.  He denies cardiovascular or  pulmonary complaints.  He eats a regular diet and denies specific food  intolerances.  He has no history of chronic atopic disease or asthmatic  bronchitis.   PHYSICAL EXAMINATION:  GENERAL APPEARANCE:  He is a somewhat obese-  appearing white male in no acute distress, appearing his stated age.  VITAL SIGNS:  He is 5 feet, 7 inches and weighs 268 pounds.  Blood  pressure is 130/86 and pulse was 84 and regular.  HEENT:  I could not appreciate stigmata of chronic liver disease.  ABDOMEN: His liver did seem enlarged in the right upper quadrant several  cm below the right costal margin.  There was no splenomegaly or other  abdominal masses or tenderness.  Bowel sounds were normal.   ASSESSMENT:  1. Eosinophilic esophagitis with recurrent strictures of esophagus      which have hopefully been successfully dilated.  2. Erosive gastroduodenitis with negative Helicobacter Pylori work up.  3. Probable fatty infiltration of the liver associated with obesity.   RECOMMENDATIONS:  1. Oral steroid inhaler with the spacer removed to be sprayed and      swallowed four times a day for a one month trial.  2. Patient information concerning eosinophilic esophagitis.  3. Continue four to six weeks of Prevacid  30 mg a day.  4. Outpatient abdominal ultrasound to check liver profile today.  5. Gastroenterology followup in one month's time.     Vania Rea. Jarold Motto, MD, Caleen Essex, FAGA  Electronically Signed    DRP/MedQ  DD: 10/05/2007  DT: 10/06/2007  Job #: [id number]   cc:   Seymour Bars, D.O.

## 2011-06-16 ENCOUNTER — Other Ambulatory Visit: Payer: Self-pay | Admitting: *Deleted

## 2011-06-16 MED ORDER — SERTRALINE HCL 100 MG PO TABS: 100.0000 mg | ORAL_TABLET | Freq: Every day | ORAL | Status: DC

## 2011-07-21 ENCOUNTER — Other Ambulatory Visit: Payer: Self-pay | Admitting: Family Medicine

## 2011-08-21 ENCOUNTER — Other Ambulatory Visit: Payer: Self-pay | Admitting: Family Medicine

## 2011-11-26 ENCOUNTER — Other Ambulatory Visit: Payer: Self-pay | Admitting: Family Medicine

## 2011-12-29 ENCOUNTER — Telehealth: Payer: Self-pay | Admitting: *Deleted

## 2011-12-29 NOTE — Telephone Encounter (Signed)
Pharmacy called requesting to get a 90 day supply of pt's Zoloft. Informed pharmacy pt needs to make an appt before this can be done and before anymore refills. Called pt's mobile number and left message for pt that he needs to make appt before anymore refills.

## 2012-01-13 ENCOUNTER — Telehealth: Payer: Self-pay | Admitting: *Deleted

## 2012-01-13 ENCOUNTER — Ambulatory Visit (INDEPENDENT_AMBULATORY_CARE_PROVIDER_SITE_OTHER): Payer: Commercial Indemnity | Admitting: Family Medicine

## 2012-01-13 ENCOUNTER — Encounter: Payer: Self-pay | Admitting: Family Medicine

## 2012-01-13 VITALS — BP 154/89 | HR 73 | Ht 67.25 in | Wt 259.0 lb

## 2012-01-13 DIAGNOSIS — B353 Tinea pedis: Secondary | ICD-10-CM

## 2012-01-13 DIAGNOSIS — E669 Obesity, unspecified: Secondary | ICD-10-CM

## 2012-01-13 DIAGNOSIS — I10 Essential (primary) hypertension: Secondary | ICD-10-CM

## 2012-01-13 DIAGNOSIS — F411 Generalized anxiety disorder: Secondary | ICD-10-CM

## 2012-01-13 DIAGNOSIS — F419 Anxiety disorder, unspecified: Secondary | ICD-10-CM

## 2012-01-13 MED ORDER — ITRACONAZOLE 200 MG PO TABS: 1.0000 | ORAL_TABLET | Freq: Every day | ORAL | Status: DC

## 2012-01-13 MED ORDER — ALPRAZOLAM 0.5 MG PO TABS: 0.5000 mg | ORAL_TABLET | Freq: Every evening | ORAL | Status: DC | PRN

## 2012-01-13 MED ORDER — SERTRALINE HCL 100 MG PO TABS: 100.0000 mg | ORAL_TABLET | Freq: Every day | ORAL | Status: DC

## 2012-01-13 MED ORDER — FLUCONAZOLE 150 MG PO TABS: 150.0000 mg | ORAL_TABLET | ORAL | Status: AC

## 2012-01-13 NOTE — Progress Notes (Addendum)
Subjective:    Patient ID: Christian Gregory, male    DOB: 11-17-1970, 41 y.o.   MRN: [id number]  HPI Anxiety - doing well on sertaline.  Says parents are having a lot of health problems and this has been stressful.  He was to continue his current regimen. Overall that he feels he is doing well. He is happy and not had any major side effects. He is sleeping well. His wife is here with him today.  HTN - Has treid 5 different meds in the past and all caused ED. Used to see [facility].  He denies any chest pain or shortness of breath. No prior history of cardiac disease. He says he eats a fairly low salt diet and is fairly active overall but he has gained some weight. He would like to have his thyroid checked.  Rash on feet x 6 months. Used lamisil OTC. Lotrin, salt water soaks, tea tree oil.  None have really helped improve the rash. He does report that the Lamisil helps with the itching at least temporarily. Her last week he started changing his socks 3 times a day.  Review of Systems     Objective:   Physical Exam  Constitutional: He is oriented to person, place, and time. He appears well-developed and well-nourished.  HENT:  Head: Normocephalic and atraumatic.  Cardiovascular: Normal rate, regular rhythm and normal heart sounds.   Pulmonary/Chest: Effort normal and breath sounds normal.  Musculoskeletal: He exhibits no edema.       No ankle edema  Neurological: He is alert and oriented to person, place, and time.  Skin: Skin is warm and dry. Rash noted.       Maceration between the toes. He has had some erythematous dry peeling skin on the tops of the toes going on to the foot. No evidence of cellulitis or active drainage is pus. He has applied some type of cream.  Psychiatric: He has a normal mood and affect. His behavior is normal.          Assessment & Plan:  Anxiety - Says wants to stay on the 100mg  for now. Discussed weaning down slowly once he is ready. He wanted note the  sertraline to be causing some of his weight gain. I explained to him that it overall is fairly weight neutral but certainly he could be in a small category but does get weight gain with the medication. Although more reason that when he feels he is ready we can try weaning the medication down. I also refilled his Xanax to 30 tabs use as needed. He does use these sparingly.   HTN - Work on low salt diet and exercise. He refuses BP med so I discussed working on weight loss. Followup in one month to recheck blood pressure. He has had better blood pressures at a lighter weight. If he is not much closer to goal at that point in time then still needs to strongly consider starting a blood pressure medication. Explained to him the risk of heart attack and stroke down the road if he does not treat this.  Tinea pedis-we will treat with an oral regimen of itraconazole since he has tried multiple over-the-counter products including Lamisil and Lotrimin et Karie Soda. Also discussed changing his socks frequently during the day and making sure that they are primarily cotton. Avoid nylon tight socks. Also make sure drying toes completely in between after shower and letting them air dry for several minutes before applying topical Lamisil  cream. We'll check his liver enzymes before starting the itraconazole. KOH was performed there he still had a fair amount of cream on the areas that might actually affect the microscopic reading.  He is also overdue to check a lipid panel. He said he had this done at work a couple months ago and will bring a copy with him to his followup appointment in one month.

## 2012-01-13 NOTE — Telephone Encounter (Signed)
Sent over new prescription. He will take it once a week instead of daily but he will need to take it for 4-6 weeks. It should be much cheaper.

## 2012-01-13 NOTE — Patient Instructions (Addendum)
Athlete's Foot Athlete's foot (tinea pedis) is a fungal infection of the skin on the feet. It often occurs on the skin between the toes or underneath the toes. It can also occur on the soles of the feet. Athlete's foot is more likely to occur in hot, humid weather. Not washing your feet or changing your socks often enough can contribute to athlete's foot. The infection can spread from person to person (contagious). CAUSES Athlete's foot is caused by a fungus. This fungus thrives in warm, moist places. Most people get athlete's foot by sharing shower stalls, towels, and wet floors with an infected person. People with weakened immune systems, including those with diabetes, may be more likely to get athlete's foot. SYMPTOMS    Itchy areas between the toes or on the soles of the feet.   White, flaky, or scaly areas between the toes or on the soles of the feet.   Tiny, intensely itchy blisters between the toes or on the soles of the feet.   Tiny cuts on the skin. These cuts can develop a bacterial infection.   Thick or discolored toenails.  DIAGNOSIS   Your caregiver can usually tell what the problem is by doing a physical exam. Your caregiver may also take a skin sample from the rash area. The skin sample may be examined under a microscope, or it may be tested to see if fungus will grow in the sample. A sample may also be taken from your toenail for testing. TREATMENT   Over-the-counter and prescription medicines can be used to kill the fungus. These medicines are available as powders or creams. Your caregiver can suggest medicines for you. Fungal infections respond slowly to treatment. You may need to continue using your medicine for several weeks. PREVENTION    Do not share towels.   Wear sandals in wet areas, such as shared locker rooms and shared showers.   Keep your feet dry. Wear shoes that allow air to circulate. Wear cotton or wool socks.  HOME CARE INSTRUCTIONS    Take medicines as  directed by your caregiver. Do not use steroid creams on athlete's foot.   Keep your feet clean and cool. Wash your feet daily and dry them thoroughly, especially between your toes.   Change your socks every day. Wear cotton or wool socks. In hot climates, you may need to change your socks 2 to 3 times per day.   Wear sandals or canvas tennis shoes with good air circulation.   If you have blisters, soak your feet in Burow's solution or Epsom salts for 20 to 30 minutes, 2 times a day to dry out the blisters. Make sure you dry your feet thoroughly afterward.  SEEK MEDICAL CARE IF:    You have a fever.   You have swelling, soreness, warmth, or redness in your foot.   You are not getting better after 7 days of treatment.   You are not completely cured after 30 days.   You have any problems caused by your medicines.  MAKE SURE YOU:    Understand these instructions.   Will watch your condition.   Will get help right away if you are not doing well or get worse.  Document Released: 08/01/2000 Document Revised: 07/24/2011 Document Reviewed: 05/23/2011 [employer] Patient Information 2012 [employer].1.5 Gram Low Sodium Diet A 1.5 gram sodium diet restricts the amount of sodium in the diet to no more than 1.5 g or 1500 mg daily. The American Heart Association recommends Americans  over the age of 63 to consume no more than 1500 mg of sodium each day to reduce the risk of developing high blood pressure. Research also shows that limiting sodium may reduce heart attack and stroke risk. Many foods contain sodium for flavor and sometimes as a preservative. When the amount of sodium in a diet needs to be low, it is important to know what to look for when choosing foods and drinks. The following includes some information and guidelines to help make it easier for you to adapt to a low sodium diet. QUICK TIPS  Do not add salt to food.   Avoid convenience items and fast food.   Choose unsalted snack  foods.   Buy lower sodium products, often labeled as "lower sodium" or "no salt added."   Check food labels to learn how much sodium is in 1 serving.   When eating at a restaurant, ask that your food be prepared with less salt or none, if possible.  READING FOOD LABELS FOR SODIUM INFORMATION The nutrition facts label is a good place to find how much sodium is in foods. Look for products with no more than 400 mg of sodium per serving. Remember that 1.5 g = 1500 mg. The food label may also list foods as:  Sodium-free: Less than 5 mg in a serving.   Very low sodium: 35 mg or less in a serving.   Low-sodium: 140 mg or less in a serving.   Light in sodium: 50% less sodium in a serving. For example, if a food that usually has 300 mg of sodium is changed to become light in sodium, it will have 150 mg of sodium.   Reduced sodium: 25% less sodium in a serving. For example, if a food that usually has 400 mg of sodium is changed to reduced sodium, it will have 300 mg of sodium.  CHOOSING FOODS Grains  Avoid: Salted crackers and snack items. Some cereals, including instant hot cereals. Bread stuffing and biscuit mixes. Seasoned rice or pasta mixes.   Choose: Unsalted snack items. Low-sodium cereals, oats, puffed wheat and rice, shredded wheat. English muffins and bread. Pasta.  Meats  Avoid: Salted, canned, smoked, spiced, pickled meats, including fish and poultry. Bacon, ham, sausage, cold cuts, hot dogs, anchovies.   Choose: Low-sodium canned tuna and salmon. Fresh or frozen meat, poultry, and fish.  Dairy  Avoid: Processed cheese and spreads. Cottage cheese. Buttermilk and condensed milk. Regular cheese.   Choose: Milk. Low-sodium cottage cheese. Yogurt. Sour cream. Low-sodium cheese.  Fruits and Vegetables  Avoid: Regular canned vegetables. Regular canned tomato sauce and paste. Frozen vegetables in sauces. Olives. Rosita Fire. Relishes. Sauerkraut.   Choose: Low-sodium canned  vegetables. Low-sodium tomato sauce and paste. Frozen or fresh vegetables. Fresh and frozen fruit.  Condiments  Avoid: Canned and packaged gravies. Worcestershire sauce. Tartar sauce. Barbecue sauce. Soy sauce. Steak sauce. Ketchup. Onion, garlic, and table salt. Meat flavorings and tenderizers.   Choose: Fresh and dried herbs and spices. Low-sodium varieties of mustard and ketchup. Lemon juice. Tabasco sauce. Horseradish.  SAMPLE 1.5 GRAM SODIUM MEAL PLAN Breakfast / Sodium (mg)  1 cup low-fat milk / 143 mg   1 whole-wheat English muffin / 240 mg   1 tbs heart-healthy margarine / 153 mg   1 hard-boiled egg / 139 mg   1 small orange / 0 mg  Lunch / Sodium (mg)  1 cup raw carrots / 76 mg   2 tbs no salt added peanut butter /  5 mg   2 slices whole-wheat bread / 270 mg   1 tbs jelly / 6 mg    cup red grapes / 2 mg  Dinner / Sodium (mg)  1 cup whole-wheat pasta / 2 mg   1 cup low-sodium tomato sauce / 73 mg   3 oz lean ground beef / 57 mg   1 small side salad (1 cup raw spinach leaves,  cup cucumber,  cup yellow bell pepper) with 1 tsp olive oil and 1 tsp red wine vinegar / 25 mg  Snack / Sodium (mg)  1 container low-fat vanilla yogurt / 107 mg   3 graham cracker squares / 127 mg  Nutrient Analysis  Calories: 1745   Protein: 75 g   Carbohydrate: 237 g   Fat: 57 g   Sodium: 1425 mg  Document Released: 08/04/2005 Document Revised: 07/24/2011 Document Reviewed: 11/05/2009 [facility] Patient Information 2012 [place], [place].

## 2012-01-13 NOTE — Telephone Encounter (Signed)
Pt states the antifungal cream you sent over is to expensive. Would like to know if you can call in something else.

## 2012-01-14 LAB — COMPLETE METABOLIC PANEL WITH GFR
ALT: 44 U/L (ref 0–53)
AST: 23 U/L (ref 0–37)
Albumin: 4.7 g/dL (ref 3.5–5.2)
Alkaline Phosphatase: 58 U/L (ref 39–117)
BUN: 19 mg/dL (ref 6–23)
CO2: 29 mEq/L (ref 19–32)
Calcium: 10 mg/dL (ref 8.4–10.5)
Chloride: 100 mEq/L (ref 96–112)
Creat: 0.97 mg/dL (ref 0.50–1.35)
GFR, Est African American: >89 mL/min
GFR, Est Non African American: >89 mL/min
Glucose, Bld: 107 mg/dL — ABNORMAL HIGH (ref 70–99)
Potassium: 4.6 mEq/L (ref 3.5–5.3)
Sodium: 138 mEq/L (ref 135–145)
Total Bilirubin: 0.6 mg/dL (ref 0.3–1.2)
Total Protein: 7.5 g/dL (ref 6.0–8.3)

## 2012-01-14 LAB — KOH PREP: RESULT - KOH: NONE SEEN

## 2012-01-14 LAB — TSH: TSH: 1.892 u[IU]/mL (ref 0.350–4.500)

## 2012-01-14 NOTE — Telephone Encounter (Signed)
Tried to call pt but unable to LM.

## 2012-01-14 NOTE — Telephone Encounter (Signed)
Pt informed

## 2012-02-18 ENCOUNTER — Ambulatory Visit (INDEPENDENT_AMBULATORY_CARE_PROVIDER_SITE_OTHER): Payer: Commercial Indemnity | Admitting: Physician Assistant

## 2012-02-18 ENCOUNTER — Encounter: Payer: Self-pay | Admitting: Physician Assistant

## 2012-02-18 VITALS — BP 139/90 | HR 80 | Ht 67.25 in | Wt 254.0 lb

## 2012-02-18 DIAGNOSIS — L0292 Furuncle, unspecified: Secondary | ICD-10-CM

## 2012-02-18 MED ORDER — SULFAMETHOXAZOLE-TMP DS 800-160 MG PO TABS: 1.0000 | ORAL_TABLET | Freq: Two times a day (BID) | ORAL | Status: AC

## 2012-02-18 MED ORDER — TRAMADOL HCL 50 MG PO TABS: 50.0000 mg | ORAL_TABLET | Freq: Four times a day (QID) | ORAL | Status: AC | PRN

## 2012-02-18 NOTE — Patient Instructions (Addendum)
Sent over culture will call with results. Sent bactrim for 10 days to pharmacy. Gave tramadol to use as needed for pain. Keep covered and change dressing until stops draining. Call office if not improving in next week. Wash with hibiclens. Use bactroban in nose three times a day.

## 2012-02-18 NOTE — Progress Notes (Signed)
  Subjective:    Patient ID: Christian Gregory, male    DOB: 1971-04-04, 41 y.o.   MRN: [id number]  HPI Pt presents to the clinic with boil for 4 days. Pt has an extensive hx of MRSA that has to have I and D in order to heal. Denies fever, chills. Boil on abdomen continues to get redder and more painful. Pt has tried hibiclnes but nothing else. Nothing is making it better and it continues to get worse.   Review of Systems     Objective:   Physical Exam  Constitutional: He appears well-developed and well-nourished.  Skin:       6cm by 4cm raised abscess with area of surrounding induration around with even larger area or cellulitis around that.  Psychiatric: He has a normal mood and affect. His behavior is normal.          Assessment & Plan:  Boil- Incision and Drainage Procedure Note  Pre-operative Diagnosis: Boil  Post-operative Diagnosis: same  Indications: infection/discomfort  Anesthesia: 1% plain lidocaine  Procedure Details  The procedure, risks and complications have been discussed in detail (including, but not limited to airway compromise, infection, bleeding) with the patient, and the patient has signed consent to the procedure.  The skin was sterilely prepped and draped over the affected area in the usual fashion. After adequate local anesthesia, I&D with a #11 blade was performed on the left, lower abdomen. Purulent drainage: present The patient was observed until stable.  Findings: Minimal purulent drainage. Mostly blood.  EBL: 1 cc's  Drains: none  Condition: Tolerated procedure well   Complications: none  Patient was told to keep dressing changed until stops draining. Use tramadol as needed for pain. Start Bactrim DS BID for 10 days. Clean with Hibiclens and bactroban in nose for prevention.

## 2012-02-21 LAB — WOUND CULTURE: Gram Stain: NONE SEEN

## 2012-05-03 ENCOUNTER — Ambulatory Visit (INDEPENDENT_AMBULATORY_CARE_PROVIDER_SITE_OTHER): Payer: Commercial Indemnity | Admitting: Family Medicine

## 2012-05-03 ENCOUNTER — Encounter: Payer: Self-pay | Admitting: Family Medicine

## 2012-05-03 VITALS — BP 157/102 | HR 77 | Wt 257.0 lb

## 2012-05-03 DIAGNOSIS — B880 Other acariasis: Secondary | ICD-10-CM

## 2012-05-03 MED ORDER — TRIAMCINOLONE ACETONIDE 0.1 % EX CREA: TOPICAL_CREAM | Freq: Two times a day (BID) | CUTANEOUS | Status: DC

## 2012-05-03 MED ORDER — METHYLPREDNISOLONE SODIUM SUCC 125 MG IJ SOLR
125.0000 mg | Freq: Once | INTRAMUSCULAR | Status: AC
  Administered 2012-05-03: 125 mg via INTRAMUSCULAR

## 2012-05-03 MED ORDER — PREDNISONE 20 MG PO TABS: ORAL_TABLET | ORAL | Status: AC

## 2012-05-03 MED ORDER — HYDROXYZINE HCL 10 MG PO TABS: 10.0000 mg | ORAL_TABLET | Freq: Three times a day (TID) | ORAL | Status: AC | PRN

## 2012-05-03 MED ORDER — METHYLPREDNISOLONE SODIUM SUCC 125 MG IJ SOLR: 125.0000 mg | Freq: Once | INTRAMUSCULAR | Status: DC

## 2012-05-03 NOTE — Progress Notes (Signed)
CC: Christian Gregory is a 41 y.o. male is here for insect bites   Subjective: HPI:  Patient presents due to a rash occurred on the bilateral lower extremities extending proximally and involving parts of groin. It's been present for matter of days and came on the day after a hunting expedition. He has had this rash years before and believes it was due to 2 chiggers. Interventions up to this point have included bleach bath,gold bond powder, and an allergy medication that involves pseudoephedrine. He denies fevers, chills, shortness of breath, closing throat, flushing, abdominal pain, GI disturbance. The rash is not particularly painful but the itching has kept him up at night.     Review Of Systems Outlined In HPI  Past Medical History  Diagnosis Date  . Anxiety     anger problems  . Hypertension   . Obesity   . Fatty liver   . Eosinophilic esophagitis   . History of methicillin resistant staphylococcus aureus (MRSA)      Family History  Problem Relation Age of Onset  . Hypertension Mother     father     History  Substance Use Topics  . Smoking status: Never Smoker   . Smokeless tobacco: Not on file  . Alcohol Use: Yes     6 pack of beer 2-3 x a week     Objective: Filed Vitals:   05/03/12 0902  BP: 157/102  Pulse: 77    General: Alert and Oriented, No Acute Distress HEENT: Pupils equal, round, reactive to light. Conjunctivae clear.  Moist mucous membranes, Neck supple without palpable lymphadenopathy nor abnormal masses. Lungs:  Comfortable work of breathing. Good air movement. Cardiac: Regular rate and rhythm.  Extremities: No peripheral edema.  Strong peripheral pulses.  Skin: Warm and dry. Numerous clusters of papules with central erythema, some with a clear transudate, involving bilateral ankles mostly on the left and scattered groups on the proximal thigh. No sign of bacterial infection. Assessment & Plan: Christian Gregory was seen today for insect bites.  Diagnoses and  associated orders for this visit:  Chigger bites - predniSONE (DELTASONE) 20 MG tablet; Three tabs daily days 1-3, two tabs daily days 4-6, one tab daily days 7-9, half tab daily days 10-13. - hydrOXYzine (ATARAX/VISTARIL) 10 MG tablet; Take 1 tablet (10 mg total) by mouth 3 (three) times daily as needed for itching. - triamcinolone cream (KENALOG) 0.1 %; Apply topically 2 (two) times daily. - methylPREDNISolone sodium succinate (SOLU-MEDROL) 125 mg/2 mL injection; Inject 2 mLs (125 mg total) into the muscle once.    Discuss symptomatically control with the above medications, patient unable to pick up prednisone until this evening therefore this Solu-Medrol given. Urged him to only use the triamcinolone instead of other topical measures. Keep areas clean and dry to prevent infection, thoroughly cleaned and sanitize close that were worn at time of exposure.  Return if symptoms worsen or fail to improve.  Requested Prescriptions   Signed Prescriptions Disp Refills  . predniSONE (DELTASONE) 20 MG tablet 20 tablet 0    Sig: Three tabs daily days 1-3, two tabs daily days 4-6, one tab daily days 7-9, half tab daily days 10-13.  . hydrOXYzine (ATARAX/VISTARIL) 10 MG tablet 30 tablet 0    Sig: Take 1 tablet (10 mg total) by mouth 3 (three) times daily as needed for itching.  . triamcinolone cream (KENALOG) 0.1 % 45 g 3    Sig: Apply topically 2 (two) times daily.  . methylPREDNISolone sodium  succinate (SOLU-MEDROL) 125 mg/2 mL injection 1 each 0    Sig: Inject 2 mLs (125 mg total) into the muscle once.

## 2012-05-31 ENCOUNTER — Other Ambulatory Visit: Payer: Self-pay | Admitting: Family Medicine

## 2012-08-31 ENCOUNTER — Other Ambulatory Visit: Payer: Self-pay | Admitting: Family Medicine

## 2012-09-02 ENCOUNTER — Ambulatory Visit (INDEPENDENT_AMBULATORY_CARE_PROVIDER_SITE_OTHER): Payer: Commercial Indemnity | Admitting: Family Medicine

## 2012-09-02 ENCOUNTER — Encounter: Payer: Self-pay | Admitting: Family Medicine

## 2012-09-02 ENCOUNTER — Ambulatory Visit (INDEPENDENT_AMBULATORY_CARE_PROVIDER_SITE_OTHER): Payer: Commercial Indemnity

## 2012-09-02 VITALS — BP 136/85 | HR 88 | Ht 67.25 in | Wt 266.0 lb

## 2012-09-02 DIAGNOSIS — M2550 Pain in unspecified joint: Secondary | ICD-10-CM

## 2012-09-02 DIAGNOSIS — M79639 Pain in unspecified forearm: Secondary | ICD-10-CM

## 2012-09-02 DIAGNOSIS — M79643 Pain in unspecified hand: Secondary | ICD-10-CM

## 2012-09-02 DIAGNOSIS — M25539 Pain in unspecified wrist: Secondary | ICD-10-CM

## 2012-09-02 DIAGNOSIS — F419 Anxiety disorder, unspecified: Secondary | ICD-10-CM

## 2012-09-02 DIAGNOSIS — R5383 Other fatigue: Secondary | ICD-10-CM

## 2012-09-02 DIAGNOSIS — F411 Generalized anxiety disorder: Secondary | ICD-10-CM

## 2012-09-02 DIAGNOSIS — M79609 Pain in unspecified limb: Secondary | ICD-10-CM

## 2012-09-02 DIAGNOSIS — Z8679 Personal history of other diseases of the circulatory system: Secondary | ICD-10-CM

## 2012-09-02 DIAGNOSIS — I10 Essential (primary) hypertension: Secondary | ICD-10-CM

## 2012-09-02 DIAGNOSIS — R5381 Other malaise: Secondary | ICD-10-CM

## 2012-09-02 MED ORDER — SERTRALINE HCL 25 MG PO TABS: ORAL_TABLET | ORAL | Status: DC

## 2012-09-02 NOTE — Progress Notes (Signed)
Subjective:    Patient ID: Christian Gregory, male    DOB: [birth year], 42 y.o.   MRN: [id number]  HPI FAtigue for 2 months. Wants his testosterone level checked. No history of thyroid disease.   Says both hands hurting for abourt 2 month. He points to all the joints in his hands. No specific joints. He denies any erythema or significant swelling over these.  Says also have pain near his left wrist over the distal forearm. No known trauma or injury. No prior fractures or surgery in this area. Worse when driving and has his hands outstretched to hold the steering well.  Says will notice it more when he puts his hands in his coat pocket. No swelling in the wrist or hands.  Says sometime feels like something floating around in there. Does a lot with hands for work as well. No fam hx of RA.  Did try some Aleve and doesn't help the aching in his hands. Some days he hardly notices it and other days it bothers him persistently.  Was told had HTN and had tried 4 meds adn all caused ED so eventually doc put him on zoloft, because he is a fairly anxious person. The thought was that his anxiety could be contributing to his hypertension. He would like to wean off the Zoloft at this point in time even that has been helpful.Marland Kitchen  He says if he mises 1-2 doses he gets really dizzy, and feels this is dangerous for the job that he does. He works as a Curator for [employer].     HTN- Pt denies chest pain, SOB, dizziness, or heart palpitations.  Taking meds as directed w/o problems.  Denies medication side effects.    Review of Systems     Objective:   Physical Exam  Constitutional: He is oriented to person, place, and time. He appears well-developed and well-nourished.  HENT:  Head: Normocephalic and atraumatic.  Cardiovascular: Normal rate, regular rhythm and normal heart sounds.   Pulmonary/Chest: Effort normal and breath sounds normal.  Musculoskeletal:       Left wrist with normal range of motion. I am able to palpate  the muscle and tendon of the abductor pollicis longus or extensor pollicis brevis. I'm not sure which one. He has no significant pain with pronation or supination of the wrist. He does have some slight discomfort with resisted dorsiflexion of the wrist. Finger strength is 5 out of 5 in all fingers. No swelling of the joints in his hands. Nontender. No bogginess. Negative squeeze test bilaterally.  Neurological: He is alert and oriented to person, place, and time.  Skin: Skin is warm and dry.  Psychiatric: He has a normal mood and affect. His behavior is normal.          Assessment & Plan:  FAtigue - Check TSH, CBC, testosterone. We'll rule out thyroid disease, anemia and testosterone deficiency. He's also been working a lot of hours and this could be contributing as well.  Anxiety - Will wean down the medication.  Explained that we will need to monitor his blood pressure carefully at him. If he becomes more symptomatic and consider a different SSRI. Though I explained to him that many of them can cause dizziness and lightheadedness when one or 2 doses are missed.  History of Hypertension-certainly we will need to wean the Zoloft but we will need to monitor blood pressures well. It's in his best interest to make sure that he is eating low salt diet,  getting some regular exercise and maintaining a healthy weight.  Bilateral Hand Pain-his exam is completely normal today. Normal range of all finger joints. No significant swelling tenderness or bogginess. No family history rheumatoid arthritis thus rheumatoid is much less likely. Suspect that he may just have some strain of the muscles and tendons themselves and possibly some early osteoarthritis because of wear and tear. I will go ahead and get blood work to look for a neurologic disorder but I suspect it will be normal.  Left forearm pain - most consistent with tendinitis of the abductor pollicis longus or the extensor pollicis brevis. This is  directly where he is having his discomfort and feels the knot sensation.. Since his symptoms have been present for almost 2 months I will get an x-ray just to rule out any bony abnormality. If x-ray is normal then recommend icing the area as well as putting him in a removable brace, that he can use at work when he is doing heavy repetitive work.

## 2012-09-02 NOTE — Patient Instructions (Signed)
Groin Strain (Adductor Strain)  A groin pull/strain refers to strained muscles on the upper inner part of the thigh. This usually occurs in muscles during exercise or participation in sports events. It is more likely to occur when your muscles are not warmed up well enough or if you are not properly conditioned. A pulled muscle, or muscle strain, occurs when a muscle is over stretched and some muscle fibers are torn. This is especially common in athletes where a sudden violent force placed on a muscle pushes it past its ability to respond. Usually, recovery from a pulled muscle takes 1 to 2 weeks, but complete healing will take 5 to 6 weeks.   HOME CARE INSTRUCTIONS    Apply ice to the sore muscle for 15 to 20 minutes every 2-3 hours for the first 2 days. Put ice in a plastic bag and place a towel between the bag of ice and your skin.   Do not strain the pulled muscle for as long as it is still painful.   Only take over-the-counter or prescription medicines for pain, discomfort, or fever as directed by your caregiver. Do not use aspirin as this may increase bleeding (bruising) at injury site.   Warming up before exercise and developing proper conditioning helps prevent muscle strains.  SEEK IMMEDIATE MEDICAL CARE IF:    There is increased pain or swelling in the affected area.   You are not improving or are getting worse.  Document Released: 04/01/2004 Document Revised: 10/27/2011 Document Reviewed: 03/27/2009  [employer] Patient Information 2013 [employer].

## 2012-09-03 LAB — CBC
HCT: 44.1 % (ref 39.0–52.0)
Hemoglobin: 15.4 g/dL (ref 13.0–17.0)
MCH: 30.9 pg (ref 26.0–34.0)
MCHC: 34.9 g/dL (ref 30.0–36.0)
MCV: 88.6 fL (ref 78.0–100.0)
Platelets: 382 10*3/uL (ref 150–400)
RBC: 4.98 MIL/uL (ref 4.22–5.81)
RDW: 12.6 % (ref 11.5–15.5)
WBC: 10.7 10*3/uL — ABNORMAL HIGH (ref 4.0–10.5)

## 2012-09-03 LAB — ANA: Anti Nuclear Antibody(ANA): POSITIVE — AB

## 2012-09-03 LAB — SEDIMENTATION RATE: Sed Rate: 5 mm/hr (ref 0–16)

## 2012-09-03 LAB — TESTOSTERONE, FREE, TOTAL, SHBG
Sex Hormone Binding: 14 nmol/L (ref 13–71)
Testosterone, Free: 58.6 pg/mL (ref 47.0–244.0)
Testosterone-% Free: 2.8 % (ref 1.6–2.9)
Testosterone: 205.65 ng/dL — ABNORMAL LOW (ref 300–890)

## 2012-09-03 LAB — RHEUMATOID FACTOR: Rheumatoid fact SerPl-aCnc: <10 IU/mL (ref <=14)

## 2012-09-03 LAB — ANTI-NUCLEAR AB-TITER (ANA TITER): ANA Titer 1: 1:40 {titer} — ABNORMAL HIGH

## 2012-09-03 LAB — TSH: TSH: 2.542 u[IU]/mL (ref 0.350–4.500)

## 2012-09-03 LAB — CYCLIC CITRUL PEPTIDE ANTIBODY, IGG: Cyclic Citrullin Peptide Ab: <2 U/mL (ref 0.0–5.0)

## 2012-09-07 ENCOUNTER — Telehealth: Payer: Self-pay | Admitting: *Deleted

## 2012-09-07 MED ORDER — TRAMADOL HCL 50 MG PO TABS: 50.0000 mg | ORAL_TABLET | Freq: Three times a day (TID) | ORAL | Status: DC | PRN

## 2012-09-07 NOTE — Telephone Encounter (Signed)
Pt calls & states that he has been taking OTC  Ibuprofen for his arm pain & it's not helping his pain at all.  He is wanting to know if you can give him something stronger.

## 2012-09-07 NOTE — Telephone Encounter (Signed)
I will send over med. Is he wearing he brace? Did he come pick it up yet. If so we need to make sure we enter charge for it.

## 2012-09-08 ENCOUNTER — Telehealth: Payer: Self-pay

## 2012-09-08 NOTE — Telephone Encounter (Signed)
Left message on machine telling Christian Gregory that his med was sent to pharmacy.  I also asked him to call me back to see if he's wearing the brace.

## 2012-09-08 NOTE — Telephone Encounter (Signed)
Med was sent yesterday and he is supposed to be wearing brace.

## 2012-09-08 NOTE — Telephone Encounter (Signed)
[place] states the ibuprofen is not helping with his arm pain. He would like something stronger.

## 2012-09-09 NOTE — Telephone Encounter (Signed)
Left message on vm that tramadol was sent and make sure to wear brace and if doing these things and arm pain is not better then call back

## 2012-09-15 ENCOUNTER — Encounter: Payer: Self-pay | Admitting: Physician Assistant

## 2012-09-15 ENCOUNTER — Ambulatory Visit (INDEPENDENT_AMBULATORY_CARE_PROVIDER_SITE_OTHER): Payer: Commercial Indemnity | Admitting: Physician Assistant

## 2012-09-15 VITALS — BP 149/98 | HR 82 | Temp 97.7°F | Wt 268.0 lb

## 2012-09-15 DIAGNOSIS — J329 Chronic sinusitis, unspecified: Secondary | ICD-10-CM

## 2012-09-15 DIAGNOSIS — E291 Testicular hypofunction: Secondary | ICD-10-CM

## 2012-09-15 DIAGNOSIS — R7989 Other specified abnormal findings of blood chemistry: Secondary | ICD-10-CM

## 2012-09-15 DIAGNOSIS — J069 Acute upper respiratory infection, unspecified: Secondary | ICD-10-CM

## 2012-09-15 MED ORDER — AZITHROMYCIN 250 MG PO TABS: ORAL_TABLET | ORAL | Status: DC

## 2012-09-15 NOTE — Progress Notes (Signed)
  Subjective:    Patient ID: Christian Gregory, male    DOB: 1971-05-20, 42 y.o.   MRN: [id number]  HPI Patient is a 42 year old male who presents to the clinic not feeling well since Thursday about 6 days ago. He is concerned because he is tapering off of Zoloft and currently at 25 mg twice a day. He has been on a slow taper. He feels very weak and sore and has no energy. He has a lot of sinus pressure and nasal congestion. He denies any ear pain but states that it is ears fill congested. Denies any sore throat. Does not have a fever today but reports a fever over the past couple days. He did not check with them on her. He denies any shortness of breath reports as long still tight. He has been very easily irritated. He's been taking NyQuil with no real relief. Patient has a cough that is dry. He had been taking Zinc daily and then stopped and got sick.   Blood pressure elevated today. Admits to using OTC decongestants. Denies any chest pains, palpitations, vision changes. No alleviating factors.  His testosterone was checked at last visit and was low would like to have recheck today.   Review of Systems     Objective:   Physical Exam  Constitutional: He is oriented to person, place, and time. He appears well-developed and well-nourished.  HENT:  Head: Normocephalic and atraumatic.  Right Ear: External ear normal.  Left Ear: External ear normal.       TMs clear bilaterally.  Oropharynx erythematous with postnasal drip present.  Turbinates swollen and red.  Mild discomfort over maxillary sinuses with palpation bilaterally. No discomfort over frontal sinuses.  Eyes: Conjunctivae normal are normal.  Neck: Normal range of motion. Neck supple.  Cardiovascular: Normal rate, regular rhythm and normal heart sounds.   Pulmonary/Chest: Effort normal and breath sounds normal. He has no wheezes.  Lymphadenopathy:    He has no cervical adenopathy.  Neurological: He is alert and oriented to person,  place, and time.  Skin: Skin is warm and dry.  Psychiatric: He has a normal mood and affect. His behavior is normal.          Assessment & Plan:  URI/sinusitis-discuss with patient that likely viral. Reassured patient that I did not think illness was do to coming off of Zoloft. He has been on a slow taper and still has some in his system. Start Mucinex D. the next couple days. Encouraged sinus rinses. Did give Z-Pak to try not improving. Gave handout for other symptomatic care. Discussed tetanus prevention with vitamin C and zinc.  Elevated blood pressure reading-since patient is not feeling well today and coughing and using over-the-counter medications We'll continue to monitor.  Decreased testosterone level- Will recheck testosterone level today.

## 2012-09-15 NOTE — Patient Instructions (Addendum)
Try Mucinex D.   Upper Respiratory Infection, Adult An upper respiratory infection (URI) is also known as the common cold. It is often caused by a type of germ (virus). Colds are easily spread (contagious). You can pass it to others by kissing, coughing, sneezing, or drinking out of the same glass. Usually, you get better in 1 or 2 weeks.  HOME CARE   Only take medicine as told by your doctor.  Use a warm mist humidifier or breathe in steam from a hot shower.  Drink enough water and fluids to keep your pee (urine) clear or pale yellow.  Get plenty of rest.  Return to work when your temperature is back to normal or as told by your doctor. You may use a face mask and wash your hands to stop your cold from spreading. GET HELP RIGHT AWAY IF:   After the first few days, you feel you are getting worse.  You have questions about your medicine.  You have chills, shortness of breath, or brown or red spit (mucus).  You have yellow or brown snot (nasal discharge) or pain in the face, especially when you bend forward.  You have a fever, puffy (swollen) neck, pain when you swallow, or white spots in the back of your throat.  You have a bad headache, ear pain, sinus pain, or chest pain.  You have a high-pitched whistling sound when you breathe in and out (wheezing).  You have a lasting cough or cough up blood.  You have sore muscles or a stiff neck. MAKE SURE YOU:   Understand these instructions.  Will watch your condition.  Will get help right away if you are not doing well or get worse. Document Released: 01/21/2008 Document Revised: 10/27/2011 Document Reviewed: 12/09/2010 [facility] Patient Information 2013 [place], [place].

## 2012-09-16 LAB — TESTOSTERONE, FREE, TOTAL, SHBG
Sex Hormone Binding: 17 nmol/L (ref 13–71)
Testosterone, Free: 54.1 pg/mL (ref 47.0–244.0)
Testosterone: 204.44 ng/dL — ABNORMAL LOW (ref 300–890)

## 2012-09-17 ENCOUNTER — Telehealth: Payer: Self-pay | Admitting: *Deleted

## 2012-09-17 NOTE — Telephone Encounter (Signed)
Pt.notified

## 2012-09-17 NOTE — Telephone Encounter (Signed)
Pt calls wanting to know testosterone results

## 2012-09-17 NOTE — Telephone Encounter (Signed)
Call pt: Let him know still low. We can start testosterone replacement at this time. There are different types of gels and application sites. If you want to come in and discuss options could show you different brands and how to apply.

## 2012-09-20 ENCOUNTER — Ambulatory Visit (INDEPENDENT_AMBULATORY_CARE_PROVIDER_SITE_OTHER): Payer: Commercial Indemnity | Admitting: Family Medicine

## 2012-09-20 ENCOUNTER — Encounter: Payer: Self-pay | Admitting: Family Medicine

## 2012-09-20 VITALS — BP 143/86 | HR 89 | Ht 67.0 in | Wt 263.0 lb

## 2012-09-20 DIAGNOSIS — E291 Testicular hypofunction: Secondary | ICD-10-CM

## 2012-09-20 DIAGNOSIS — E669 Obesity, unspecified: Secondary | ICD-10-CM

## 2012-09-20 MED ORDER — TESTOSTERONE 20.25 MG/1.25GM (1.62%) TD GEL: 2.0000 | Freq: Every morning | TRANSDERMAL | Status: DC

## 2012-09-20 NOTE — Progress Notes (Signed)
  Subjective:    Patient ID: Christian Gregory, male    DOB: 07-20-1971, 42 y.o.   MRN: [id number]  HPI Hypogonadism-he's here to discuss his test results. He had low testosterone on 2 separate occasions about 2 weeks apart. He does report feeling fatigued. He has a lot of abdominal obesity. He does have a fairly active job. He's recently been focusing on his diet and trying to make a lot of changes. He's cut out his that it has been drinking more water. He's also been trying a little bit lighter meals. He does think that this is helped. He denies any depressive type symptoms. Denies any lower libido. He says he is not interested in any topical agents and only wants to the shot. He says he has several friends that are on testosterone replacement and a 10 like the shot better.   Review of Systems     Objective:   Physical Exam  Constitutional: He appears well-developed and well-nourished.  HENT:  Head: Normocephalic and atraumatic.  Skin: Skin is warm and dry.  Psychiatric: He has a normal mood and affect. His behavior is normal.          Assessment & Plan:  Hypogonadism-discussed new diagnosis. We also discussed the risks and potential benefits of testosterone replacement. We discussed that there are some newer studies coming out showing possible and increase her disease. I showed him different forms of testosterone replacement including topical applications versus injections and we discussed pros and cons. Patient decided to start with the AndroGel. Coupon card given. He can try this for the next month or 2. I went ahead and get him a lab slip to go the lab in one month to recheck his testosterone, PSA and liver enzymes. We can always switch to the injection.  Obesity-we also discussed the importance of weight loss. Extra stat increases estrogen which causes imbalance and hormones. I think that further he's made some great changes to his diet and is on his way to hopefully losing some weight. He  really should weigh under 200 pounds.  Time spent 25 minutes, greater than 50% of time discussing his low testosterone levels.

## 2012-09-21 ENCOUNTER — Other Ambulatory Visit: Payer: Self-pay | Admitting: *Deleted

## 2012-09-21 MED ORDER — TESTOSTERONE 20.25 MG/1.25GM (1.62%) TD GEL: 2.0000 | Freq: Every morning | TRANSDERMAL | Status: DC

## 2012-09-22 ENCOUNTER — Telehealth: Payer: Self-pay

## 2012-09-22 NOTE — Telephone Encounter (Signed)
Ok to start injection of 200mg  IM Q 2 weeks.  Can come in and we can show him how to admin injection. He wants to be able to do them at home.

## 2012-09-22 NOTE — Telephone Encounter (Signed)
Patient scheduled.

## 2012-09-22 NOTE — Telephone Encounter (Signed)
He cannot afford the Androgel and wants to start the injections. Can we schedule him for a nurse visit and if so what dose should he be on.

## 2012-09-24 ENCOUNTER — Ambulatory Visit (INDEPENDENT_AMBULATORY_CARE_PROVIDER_SITE_OTHER): Payer: Commercial Indemnity | Admitting: Family Medicine

## 2012-09-24 VITALS — BP 150/74 | HR 74

## 2012-09-24 DIAGNOSIS — E291 Testicular hypofunction: Secondary | ICD-10-CM

## 2012-09-24 MED ORDER — TESTOSTERONE CYPIONATE 200 MG/ML IM SOLN
200.0000 mg | Freq: Once | INTRAMUSCULAR | Status: AC
  Administered 2012-09-24: 200 mg via INTRAMUSCULAR

## 2012-09-24 NOTE — Progress Notes (Signed)
  Subjective:    Patient ID: Christian Gregory, male    DOB: 1971-02-04, 42 y.o.   MRN: [id number]  Christian Gregory is here for testosterone injection and education. HPI    Review of Systems     Objective:   Physical Exam        Assessment & Plan:  Testosterone injection -  Spent 10 educating patient on the best site to inject. He verbalizes understanding. Advised patient to call if mood changes, shortness of breath, chest pain or headaches.

## 2012-10-06 ENCOUNTER — Other Ambulatory Visit: Payer: Self-pay | Admitting: Family Medicine

## 2012-10-06 ENCOUNTER — Telehealth: Payer: Self-pay | Admitting: *Deleted

## 2012-10-06 DIAGNOSIS — E291 Testicular hypofunction: Secondary | ICD-10-CM

## 2012-10-06 MED ORDER — TESTOSTERONE CYPIONATE 200 MG/ML IM SOLN: 200.0000 mg | INTRAMUSCULAR | Status: DC

## 2012-10-06 NOTE — Telephone Encounter (Signed)
Pt calls and states that he needs a rx for his testosterone that he self administers at home. Also what kind of schedule is he on

## 2012-10-06 NOTE — Telephone Encounter (Signed)
I placed med order but didn't print since I am working from home.  He will inject 1ml ( ) every 2 weeks.  In 6 weeks will need labs including PSA, testosterone level, and liver enzymes.  He should go about 1 week after injection.  I will placed future order for labs.

## 2012-10-06 NOTE — Telephone Encounter (Signed)
LMOM to return call. Kimberly Gordon, LPN  

## 2012-10-07 NOTE — Telephone Encounter (Signed)
Pt notified about his rx & labs needed.

## 2012-10-31 ENCOUNTER — Other Ambulatory Visit: Payer: Self-pay | Admitting: Family Medicine

## 2012-12-31 ENCOUNTER — Other Ambulatory Visit: Payer: Self-pay | Admitting: *Deleted

## 2012-12-31 MED ORDER — SERTRALINE HCL 25 MG PO TABS: 25.0000 mg | ORAL_TABLET | Freq: Every day | ORAL | Status: DC

## 2013-01-07 ENCOUNTER — Encounter: Payer: Self-pay | Admitting: Family Medicine

## 2013-01-07 ENCOUNTER — Ambulatory Visit (INDEPENDENT_AMBULATORY_CARE_PROVIDER_SITE_OTHER): Payer: Commercial Indemnity | Admitting: Family Medicine

## 2013-01-07 VITALS — BP 162/88 | HR 69 | Wt 261.0 lb

## 2013-01-07 DIAGNOSIS — F0630 Mood disorder due to known physiological condition, unspecified: Secondary | ICD-10-CM

## 2013-01-07 DIAGNOSIS — I10 Essential (primary) hypertension: Secondary | ICD-10-CM

## 2013-01-07 DIAGNOSIS — E291 Testicular hypofunction: Secondary | ICD-10-CM

## 2013-01-07 LAB — COMPLETE METABOLIC PANEL WITH GFR
AST: 29 U/L (ref 0–37)
Albumin: 4.6 g/dL (ref 3.5–5.2)
BUN: 13 mg/dL (ref 6–23)
CO2: 28 mEq/L (ref 19–32)
Calcium: 9.7 mg/dL (ref 8.4–10.5)
Chloride: 100 mEq/L (ref 96–112)
Creat: 1.14 mg/dL (ref 0.50–1.35)
GFR, Est African American: >89 mL/min
GFR, Est Non African American: 79 mL/min
Glucose, Bld: 101 mg/dL — ABNORMAL HIGH (ref 70–99)
Potassium: 4.7 mEq/L (ref 3.5–5.3)

## 2013-01-07 LAB — LIPID PANEL
Cholesterol: 216 mg/dL — ABNORMAL HIGH (ref 0–200)
Triglycerides: 177 mg/dL — ABNORMAL HIGH (ref <150)
VLDL: 35 mg/dL (ref 0–40)

## 2013-01-07 MED ORDER — HYDROCHLOROTHIAZIDE 25 MG PO TABS: 25.0000 mg | ORAL_TABLET | Freq: Every day | ORAL | Status: DC

## 2013-01-07 MED ORDER — SERTRALINE HCL 100 MG PO TABS: 100.0000 mg | ORAL_TABLET | Freq: Every day | ORAL | Status: DC

## 2013-01-07 NOTE — Patient Instructions (Signed)
Start half tab daily for one week then increase to whole tab of the 100mg  dose.

## 2013-01-07 NOTE — Progress Notes (Signed)
Subjective:    Patient ID: Christian Gregory, male    DOB: 1971/07/19, 42 y.o.   MRN: [id number]  HPI Says he started tapering his dose of sertraline about 2 months. Has been more irritable and angry.  Had no S.E. On the drugs.  No sexual S.E. No CP or SOB.  Sleep is good. He just noticing that he is napping more at work and has children.  he denies any depressive type symptoms are increased anxiety. He denies any thoughts of hurting himself.  Hypogonadism - Last injection was last Friday ( 5 days ago).  He is due for blood work. He feels like he is tolerating the injections well  Hypertension-to get his blood pressures and they have been high the last 3-4 times he has been here. He denies any chest pain or short of breath. He's tried for other blood pressure medications in the past unfortunately experienced erectile dysfunction with these. He is a little bit hesitant about starting something new. He has been trying to work on his weight and has lost a couple pounds which is fantastic. Review of Systems  BP 162/88  Pulse 69  Wt 261 lb (118.389 kg)  BMI 40.87 kg/m2    Allergies  Allergen Reactions  . Penicillins     REACTION: anaphalatic    Past Medical History  Diagnosis Date  . Anxiety     anger problems  . Hypertension   . Obesity   . Fatty liver   . Eosinophilic esophagitis   . History of methicillin resistant staphylococcus aureus (MRSA)     Past Surgical History  Procedure Laterality Date  . Esophageal dilation      History   Social History  . Marital Status: Married    Spouse Name: N/A    Number of Children: N/A  . Years of Education: N/A   Occupational History  . Not on file.   Social History Main Topics  . Smoking status: Never Smoker   . Smokeless tobacco: Not on file  . Alcohol Use: Yes     Comment: 6 pack of beer 2-3 x a week  . Drug Use: No  . Sexually Active: Yes   Other Topics Concern  . Not on file   Social History Narrative  . No narrative on  file    Family History  Problem Relation Age of Onset  . Hypertension Mother     father    Outpatient Encounter Prescriptions as of 01/07/2013  Medication Sig Dispense Refill  . ALPRAZolam (XANAX) 0.5 MG tablet Take 1 tablet (0.5 mg total) by mouth at bedtime as needed.  30 tablet  0  . sertraline (ZOLOFT) 100 MG tablet Take 1 tablet (100 mg total) by mouth daily.  30 tablet  1  . testosterone cypionate (DEPOTESTOTERONE CYPIONATE) 200 MG/ML injection Inject 1 mL (200 mg total) into the muscle every 14 (fourteen) days.  10 mL  0  . traMADol (ULTRAM) 50 MG tablet Take 1 tablet (50 mg total) by mouth every 8 (eight) hours as needed for pain.  45 tablet  0  . [DISCONTINUED] sertraline (ZOLOFT) 100 MG tablet Take 1 tablet (100 mg total) by mouth daily.  30 tablet  1  . [DISCONTINUED] sertraline (ZOLOFT) 25 MG tablet Take 1 tablet (25 mg total) by mouth daily.  14 tablet  0  . hydrochlorothiazide (HYDRODIURIL) 25 MG tablet Take 1 tablet (25 mg total) by mouth daily.  30 tablet  2  . [DISCONTINUED] hydrochlorothiazide (  HYDRODIURIL) 25 MG tablet Take 1 tablet (25 mg total) by mouth daily.  30 tablet  2   No facility-administered encounter medications on file as of 01/07/2013.          Objective:   Physical Exam  Constitutional: He is oriented to person, place, and time. He appears well-developed and well-nourished.  HENT:  Head: Normocephalic and atraumatic.  Cardiovascular: Normal rate, regular rhythm and normal heart sounds.   Pulmonary/Chest: Effort normal and breath sounds normal.  Neurological: He is alert and oriented to person, place, and time.  Skin: Skin is warm and dry.  Psychiatric: He has a normal mood and affect. His behavior is normal.          Assessment & Plan:  Mood D/O.-  PHQ - 9 score of 0. Discussed options. Will increase zoloft back to 100mg , he will increase to 50 mg for one week and then increase to a whole tab of 100 mg. his previous dose.  F/U in 6 weeks.    Hypogonadism - will check level 4 prostate and for her testosterone levels. His last injection was about 5 days ago. He is tolerating it well without any side effects and is filled helpful. His weight is down about 7 pounds from January which is fantastic.  Hypertension - he has tried about for blood pressure agents in the past unfortunately experienced ED. We will try hydrochlorothiazide hopefully he'll tolerate this well. Follow up in 6 weeks for blood pressure check.

## 2013-01-11 LAB — TESTOSTERONE, FREE, TOTAL, SHBG: Testosterone, Free: 341.3 pg/mL — ABNORMAL HIGH (ref 47.0–244.0)

## 2013-01-12 ENCOUNTER — Other Ambulatory Visit: Payer: Self-pay | Admitting: *Deleted

## 2013-01-12 DIAGNOSIS — E663 Overweight: Secondary | ICD-10-CM

## 2013-01-12 DIAGNOSIS — E291 Testicular hypofunction: Secondary | ICD-10-CM

## 2013-01-12 NOTE — Progress Notes (Signed)
Repeat testosterone.Loralee Pacas [place]

## 2013-02-07 ENCOUNTER — Other Ambulatory Visit: Payer: Self-pay | Admitting: Family Medicine

## 2013-02-07 DIAGNOSIS — E663 Overweight: Secondary | ICD-10-CM

## 2013-02-07 DIAGNOSIS — E291 Testicular hypofunction: Secondary | ICD-10-CM

## 2013-02-07 DIAGNOSIS — R7309 Other abnormal glucose: Secondary | ICD-10-CM

## 2013-02-08 ENCOUNTER — Encounter: Payer: Self-pay | Admitting: Family Medicine

## 2013-02-08 ENCOUNTER — Other Ambulatory Visit: Payer: Self-pay | Admitting: *Deleted

## 2013-02-08 DIAGNOSIS — E099 Drug or chemical induced diabetes mellitus without complications: Secondary | ICD-10-CM | POA: Insufficient documentation

## 2013-02-08 DIAGNOSIS — R7301 Impaired fasting glucose: Secondary | ICD-10-CM

## 2013-02-08 LAB — TESTOSTERONE, FREE, TOTAL, SHBG
Testosterone, Free: 53 pg/mL (ref 47.0–244.0)
Testosterone-% Free: 2.7 % (ref 1.6–2.9)
Testosterone: 196 ng/dL — ABNORMAL LOW (ref 300–890)

## 2013-02-08 LAB — HEMOGLOBIN A1C
Hgb A1c MFr Bld: 6.1 % — ABNORMAL HIGH
Mean Plasma Glucose: 128 mg/dL — ABNORMAL HIGH

## 2013-02-08 MED ORDER — TESTOSTERONE CYPIONATE 200 MG/ML IM SOLN: 200.0000 mg | INTRAMUSCULAR | Status: DC

## 2013-03-20 ENCOUNTER — Other Ambulatory Visit: Payer: Self-pay | Admitting: Family Medicine

## 2013-04-25 ENCOUNTER — Other Ambulatory Visit: Payer: Self-pay | Admitting: Family Medicine

## 2013-05-22 ENCOUNTER — Other Ambulatory Visit: Payer: Self-pay | Admitting: Family Medicine

## 2013-06-18 ENCOUNTER — Other Ambulatory Visit: Payer: Self-pay | Admitting: Family Medicine

## 2013-06-26 ENCOUNTER — Other Ambulatory Visit: Payer: Self-pay | Admitting: Family Medicine

## 2013-07-18 ENCOUNTER — Other Ambulatory Visit: Payer: Self-pay | Admitting: *Deleted

## 2013-07-18 MED ORDER — TESTOSTERONE CYPIONATE 200 MG/ML IM SOLN: 200.0000 mg | INTRAMUSCULAR | Status: DC

## 2013-07-24 ENCOUNTER — Other Ambulatory Visit: Payer: Self-pay | Admitting: Family Medicine

## 2013-08-21 ENCOUNTER — Other Ambulatory Visit: Payer: Self-pay | Admitting: Family Medicine

## 2013-08-22 ENCOUNTER — Telehealth: Payer: Self-pay | Admitting: *Deleted

## 2013-08-22 NOTE — Telephone Encounter (Signed)
LM for pt stating that we can only send 15 tabs of Zoloft to pharmacy due to needing appt.  Meyer CoryMisty Ahmad, LPN

## 2013-08-25 ENCOUNTER — Ambulatory Visit (INDEPENDENT_AMBULATORY_CARE_PROVIDER_SITE_OTHER): Payer: BC Managed Care – PPO | Admitting: Sports Medicine

## 2013-08-25 ENCOUNTER — Encounter: Payer: Self-pay | Admitting: Sports Medicine

## 2013-08-25 ENCOUNTER — Ambulatory Visit (INDEPENDENT_AMBULATORY_CARE_PROVIDER_SITE_OTHER): Payer: BC Managed Care – PPO

## 2013-08-25 VITALS — BP 135/82 | HR 65 | Wt 271.0 lb

## 2013-08-25 DIAGNOSIS — M5416 Radiculopathy, lumbar region: Secondary | ICD-10-CM | POA: Insufficient documentation

## 2013-08-25 DIAGNOSIS — M545 Low back pain, unspecified: Secondary | ICD-10-CM

## 2013-08-25 DIAGNOSIS — E669 Obesity, unspecified: Secondary | ICD-10-CM

## 2013-08-25 DIAGNOSIS — M5137 Other intervertebral disc degeneration, lumbosacral region: Secondary | ICD-10-CM

## 2013-08-25 MED ORDER — PREDNISONE 50 MG PO TABS: ORAL_TABLET | ORAL | Status: DC

## 2013-08-25 MED ORDER — MELOXICAM 15 MG PO TABS: ORAL_TABLET | ORAL | Status: DC

## 2013-08-25 MED ORDER — PHENTERMINE HCL 37.5 MG PO CAPS: 37.5000 mg | ORAL_CAPSULE | ORAL | Status: DC

## 2013-08-25 NOTE — Progress Notes (Signed)
   Subjective:    I'm seeing this patient as a consultation for:  Dr. Linford ArnoldMetheney  CC: Low back pain  HPI: This is a pleasant 43 year old male, for the past month he's had an increase in his low back pain that he localizes over the midline of his mid lumbar spine. There is some radiation into the buttock. On further questioning he has had back pain for decades. He denies any trauma, constitutional symptoms, pain is worse when standing up straight, and not so bad when riding in a car, flexion of his flank, or valsalva. Moderate, persistent.  Obesity: Has tried exercise, dieting, nothing is worse, wondering if he can start weight loss medication.  Past medical history, Surgical history, Family history not pertinant except as noted below, Social history, Allergies, and medications have been entered into the medical record, reviewed, and no changes needed.   Review of Systems: No headache, visual changes, nausea, vomiting, diarrhea, constipation, dizziness, abdominal pain, skin rash, fevers, chills, night sweats, weight loss, swollen lymph nodes, body aches, joint swelling, muscle aches, chest pain, shortness of breath, mood changes, visual or auditory hallucinations.   Objective:   General: Well Developed, well nourished, and in no acute distress.  Neuro/Psych: Alert and oriented x3, extra-ocular muscles intact, able to move all 4 extremities, sensation grossly intact. Skin: Warm and dry, no rashes noted.  Respiratory: Not using accessory muscles, speaking in full sentences, trachea midline.  Cardiovascular: Pulses palpable, no extremity edema. Abdomen: Does not appear distended. Back Exam:  Inspection: Unremarkable  Motion: Flexion 45 deg, Extension 45 deg, Side Bending to 45 deg bilaterally,  Rotation to 45 deg bilaterally  SLR laying: Negative  XSLR laying: Negative  Palpable tenderness: None. FABER: negative. Sensory change: Gross sensation intact to all lumbar and sacral dermatomes.    Reflexes: 2+ at both patellar tendons, 2+ at achilles tendons, Babinski's downgoing.  Strength at foot  Plantar-flexion: 5/5 Dorsi-flexion: 5/5 Eversion: 5/5 Inversion: 5/5  Leg strength  Quad: 5/5 Hamstring: 5/5 Hip flexor: 5/5 Hip abductors: 5/5  Gait unremarkable.  Impression and Recommendations:   This case required medical decision making of moderate complexity.

## 2013-08-25 NOTE — Assessment & Plan Note (Signed)
We had a discussion, he is going to start phentermine, see the nutritionist, I have given him an exercise prescription. He will followup weight loss treatment with his primary care provider.

## 2013-08-25 NOTE — Assessment & Plan Note (Signed)
Symptoms do sound predominately facetogenic. Home therapy, Mobic, prednisone, x-rays. Return to see me in 4-6 weeks, MRI if no better.

## 2013-09-06 ENCOUNTER — Other Ambulatory Visit: Payer: Self-pay | Admitting: Family Medicine

## 2013-09-20 ENCOUNTER — Ambulatory Visit: Payer: BC Managed Care – PPO | Admitting: Family Medicine

## 2013-09-21 ENCOUNTER — Encounter: Payer: Self-pay | Admitting: Physician Assistant

## 2013-09-21 ENCOUNTER — Ambulatory Visit (INDEPENDENT_AMBULATORY_CARE_PROVIDER_SITE_OTHER): Payer: BC Managed Care – PPO | Admitting: Physician Assistant

## 2013-09-21 VITALS — BP 144/85 | HR 77 | Wt 260.0 lb

## 2013-09-21 DIAGNOSIS — E669 Obesity, unspecified: Secondary | ICD-10-CM

## 2013-09-21 DIAGNOSIS — F39 Unspecified mood [affective] disorder: Secondary | ICD-10-CM | POA: Insufficient documentation

## 2013-09-21 MED ORDER — PHENTERMINE HCL 37.5 MG PO CAPS: 37.5000 mg | ORAL_CAPSULE | ORAL | Status: DC

## 2013-09-21 MED ORDER — SERTRALINE HCL 100 MG PO TABS: ORAL_TABLET | ORAL | Status: DC

## 2013-09-21 NOTE — Progress Notes (Signed)
   Subjective:    Patient ID: Christian Gregory, male    DOB: [birth year], 43 y.o.   MRN: [id number]  HPI Patient is a 43 year old male who presents to the clinic to followup on phentermine and to get refills of his Zoloft.  Patient has done very well on phentermine he is down 11 pounds from one month ago. He denies any side effects of anorexia, insomnia, palpitations. He has started eating much more balanced meals with protein and less carbs. He has not met with a nutritionist at this point. He has not started any formal exercise but has been will remain active at work.   Patient also remains on Zoloft daily. Patient reports that his helped significantly with his mood. He denies any symptoms of depression or anxiety.    Review of Systems     Objective:   Physical Exam  Constitutional: He is oriented to person, place, and time. He appears well-developed and well-nourished.  HENT:  Head: Normocephalic and atraumatic.  Cardiovascular: Normal rate, regular rhythm and normal heart sounds.   Pulmonary/Chest: Effort normal and breath sounds normal.  Neurological: He is alert and oriented to person, place, and time.  Skin: Skin is dry.  Psychiatric: He has a normal mood and affect. His behavior is normal.          Assessment & Plan:  Obesity/hypertension-BP is up just a tab from one month ago. We'll continue to monitor. Will refill phentermine for one more month and recheck blood pressure. Discussed with patient that if blood pressure continues to stay elevated for, we may have to decrease the amount of phentermine. Continue to exercise regularly and make diet changes to maximize phentermine benefit.  Mood disorder- pH Q.-9 was 0. Zoloft refilled for 6 months.

## 2013-10-03 ENCOUNTER — Telehealth: Payer: Self-pay | Admitting: *Deleted

## 2013-10-03 MED ORDER — TESTOSTERONE CYPIONATE 200 MG/ML IM SOLN: 200.0000 mg | INTRAMUSCULAR | Status: DC

## 2013-10-03 NOTE — Telephone Encounter (Signed)
Pt will need to have labs done in 1 month after restarting injections.Christian PacasBarkley, Christian Gregory Christian Gregory

## 2013-10-03 NOTE — Telephone Encounter (Signed)
Pt would like refill on testosterone. However he has not had labs drawn since 6.23.14 and his level was 196. He stated that he would be coming by today and could get this done if needed in order to get refills restarted. Please advise.Loralee PacasBarkley, Alayssa Flinchum [place]

## 2013-10-19 ENCOUNTER — Ambulatory Visit (INDEPENDENT_AMBULATORY_CARE_PROVIDER_SITE_OTHER): Payer: BC Managed Care – PPO | Admitting: Physician Assistant

## 2013-10-19 VITALS — BP 137/81 | HR 71 | Temp 98.2°F | Ht 67.0 in | Wt 254.0 lb

## 2013-10-19 DIAGNOSIS — E669 Obesity, unspecified: Secondary | ICD-10-CM

## 2013-10-19 MED ORDER — PHENTERMINE HCL 37.5 MG PO CAPS: 37.5000 mg | ORAL_CAPSULE | ORAL | Status: DC

## 2013-10-19 NOTE — Progress Notes (Signed)
Patient doing well on appetite suppressant. Here for nurse visit, weight, BP, HR check. Denies problems with insomnia, heart palpitations or tremors. Satisfied with weight loss thus far and is working on [employer] diet and regular exercise. Laureen Ochs.Aundrea Higginbotham, Viann Shoveonya [place]   Down 6lbs today. Will refill for another month. Follow up in 1 month. Tandy GawJade Breeback PA-C

## 2014-03-26 ENCOUNTER — Other Ambulatory Visit: Payer: Self-pay | Admitting: Physician Assistant

## 2014-03-27 ENCOUNTER — Ambulatory Visit (INDEPENDENT_AMBULATORY_CARE_PROVIDER_SITE_OTHER): Payer: BC Managed Care – PPO | Admitting: Family Medicine

## 2014-03-27 ENCOUNTER — Encounter: Payer: Self-pay | Admitting: Family Medicine

## 2014-03-27 VITALS — BP 138/96 | HR 80 | Wt 248.0 lb

## 2014-03-27 DIAGNOSIS — E291 Testicular hypofunction: Secondary | ICD-10-CM | POA: Diagnosis not present

## 2014-03-27 DIAGNOSIS — M7710 Lateral epicondylitis, unspecified elbow: Secondary | ICD-10-CM

## 2014-03-27 DIAGNOSIS — R7301 Impaired fasting glucose: Secondary | ICD-10-CM | POA: Diagnosis not present

## 2014-03-27 DIAGNOSIS — G5600 Carpal tunnel syndrome, unspecified upper limb: Secondary | ICD-10-CM

## 2014-03-27 DIAGNOSIS — M7712 Lateral epicondylitis, left elbow: Secondary | ICD-10-CM

## 2014-03-27 DIAGNOSIS — G5603 Carpal tunnel syndrome, bilateral upper limbs: Secondary | ICD-10-CM

## 2014-03-27 LAB — POCT GLYCOSYLATED HEMOGLOBIN (HGB A1C): HEMOGLOBIN A1C: 5.7

## 2014-03-27 MED ORDER — TESTOSTERONE CYPIONATE 200 MG/ML IM SOLN: 200.0000 mg | INTRAMUSCULAR | Status: DC

## 2014-03-27 MED ORDER — MELOXICAM 7.5 MG PO TABS: 7.5000 mg | ORAL_TABLET | Freq: Two times a day (BID) | ORAL | Status: DC | PRN

## 2014-03-27 NOTE — Progress Notes (Signed)
Subjective:    Patient ID: Christian Gregory, male    DOB: 08/25/1970, 43 y.o.   MRN: [id number]  HPI Says feels a knot on the left lateral elbow.  Says when throws  Foot ball feels a pulling sensatoin down the forearm.  Also c/o hands going numb x 3-4 months.  He says it primarily affects the first and second fingers on both hands but sometimes feels like it's the whole hand. He denies any acute injury or trauma. No worsening or alleviating factors.  He is [employer], [employer], for a living.  Denies any numbness in Am. Feels like fingers are stiff if sits for awhile. Pain in the middle finger on the left hand. Mom has severe arthritis with defromity, but not sure if it's rheumatoid arthritis or osteoarthritis..    IFG - he has been exercising regularly. Last A1c was a year ago.  Abnormal weight gain-he would like to consider restarting phentermine. We had used this in the past. He started exercising regularly. But feels like he is just not losing weight like he would like to. He tolerated it fairly well in the past without any side effects.  Review of Systems     BP 138/96  Pulse 80  Wt 248 lb (112.492 kg)    Allergies  Allergen Reactions  . Penicillins     REACTION: anaphalatic    Past Medical History  Diagnosis Date  . Anxiety     anger problems  . Hypertension   . Obesity   . Fatty liver   . Eosinophilic esophagitis   . History of methicillin resistant staphylococcus aureus (MRSA)     Past Surgical History  Procedure Laterality Date  . Esophageal dilation      History   Social History  . Marital Status: Married    Spouse Name: N/A    Number of Children: N/A  . Years of Education: N/A   Occupational History  . Not on file.   Social History Main Topics  . Smoking status: Never Smoker   . Smokeless tobacco: Not on file  . Alcohol Use: Yes     Comment: 6 pack of beer 2-3 x a week  . Drug Use: No  . Sexual Activity: Yes   Other Topics Concern  . Not on file    Social History Narrative  . No narrative on file    Family History  Problem Relation Age of Onset  . Hypertension Mother     father  . Stroke Father     Outpatient Encounter Prescriptions as of 03/27/2014  Medication Sig  . ALPRAZolam (XANAX) 0.5 MG tablet Take 1 tablet (0.5 mg total) by mouth at bedtime as needed.  . sertraline (ZOLOFT) 100 MG tablet TAKE 1 TABLET (100 MG TOTAL) BY MOUTH DAILY.  Marland Kitchen. testosterone cypionate (DEPOTESTOTERONE CYPIONATE) 200 MG/ML injection Inject 1 mL (200 mg total) into the muscle every 14 (fourteen) days.  . [DISCONTINUED] phentermine 37.5 MG capsule Take 1 capsule (37.5 mg total) by mouth every morning.  . [DISCONTINUED] testosterone cypionate (DEPOTESTOTERONE CYPIONATE) 200 MG/ML injection Inject 1 mL (200 mg total) into the muscle every 14 (fourteen) days.  . meloxicam (MOBIC) 7.5 MG tablet Take 1 tablet (7.5 mg total) by mouth 2 (two) times daily as needed for pain.  . [DISCONTINUED] meloxicam (MOBIC) 15 MG tablet One tab PO qAM with breakfast for 2 weeks, then daily prn pain.       Objective:   Physical Exam  Constitutional: He  is oriented to person, place, and time. He appears well-developed and well-nourished.  HENT:  Head: Normocephalic and atraumatic.  Cardiovascular: Normal rate, regular rhythm and normal heart sounds.   Pulmonary/Chest: Effort normal and breath sounds normal.  Musculoskeletal:  Bilateral wrists with normal range of motion. No significant tenderness on exam. Strength is 5 out of 5 in all directions. Neg tinnels and phalens sign.  Tender over the lat epicondyle. normal pronation and supination. Some pain with resistance with external rotation.    Neurological: He is alert and oriented to person, place, and time.  Skin: Skin is warm and dry.  Psychiatric: He has a normal mood and affect. His behavior is normal.          Assessment & Plan:  Carpel tunnel - discussed treatment option with bilateral cockup splints to  be worn at bedtime only. Recommend anti-inflammatory help with inflammation. He says he tolerates this well. Stop immediately if any GI upset or irritation. He is not improving over the next 3 weeks then please let us know and can schedule for possible injection with our sports medicine doctor. Handout provided for home physical therapy exercises to do on his own. Fitted with bilateral cock-up splint.  Tennis elbow- discussed diagnosis with him. Recommended elbow strap. This can be obtained over-the-counter. Recommend home physical therapy exercises and handout provided on how to do this on his own. Anti-inflammatory as tolerated.  abnormal weight gain - He would like to restart phentermine. His blood pressures not well controlled today. Interval followup in 1-2 weeks if he would like to recheck his pressure to see if we can restart phentermine.  IFG - A1c is down to 5.7 which is fantastic compared to last year. Continue work on diet and exercise and recheck in 6 months. Lab Results  Component Value Date   HGBA1C 5.7 03/27/2014   Hypogonadism-he would like to retest start his testosterone therapy. His last levels were a year ago these will need to be rechecked.

## 2014-03-28 LAB — TESTOSTERONE, FREE, TOTAL, SHBG
Sex Hormone Binding: 19 nmol/L (ref 13–71)
TESTOSTERONE-% FREE: 2.7 % (ref 1.6–2.9)
Testosterone, Free: 131.2 pg/mL (ref 47.0–244.0)
Testosterone: 479 ng/dL (ref 300–890)

## 2014-03-29 LAB — PSA: PSA: 0.66 ng/mL (ref <=4.00)

## 2014-05-23 ENCOUNTER — Other Ambulatory Visit: Payer: Self-pay | Admitting: Family Medicine

## 2014-06-14 ENCOUNTER — Ambulatory Visit (INDEPENDENT_AMBULATORY_CARE_PROVIDER_SITE_OTHER): Payer: BC Managed Care – PPO | Admitting: Family Medicine

## 2014-06-14 ENCOUNTER — Encounter: Payer: Self-pay | Admitting: Family Medicine

## 2014-06-14 VITALS — BP 162/94 | HR 74 | Ht 67.0 in | Wt 245.0 lb

## 2014-06-14 DIAGNOSIS — M7712 Lateral epicondylitis, left elbow: Secondary | ICD-10-CM | POA: Diagnosis not present

## 2014-06-14 DIAGNOSIS — G5602 Carpal tunnel syndrome, left upper limb: Secondary | ICD-10-CM | POA: Diagnosis not present

## 2014-06-14 DIAGNOSIS — G5601 Carpal tunnel syndrome, right upper limb: Secondary | ICD-10-CM

## 2014-06-14 DIAGNOSIS — G5603 Carpal tunnel syndrome, bilateral upper limbs: Secondary | ICD-10-CM

## 2014-06-14 DIAGNOSIS — I10 Essential (primary) hypertension: Secondary | ICD-10-CM | POA: Diagnosis not present

## 2014-06-14 MED ORDER — ALPRAZOLAM 0.5 MG PO TABS: 0.5000 mg | ORAL_TABLET | Freq: Every evening | ORAL | Status: DC | PRN

## 2014-06-14 MED ORDER — TESTOSTERONE CYPIONATE 200 MG/ML IM SOLN: 200.0000 mg | INTRAMUSCULAR | Status: DC

## 2014-06-14 MED ORDER — AMLODIPINE BESYLATE 2.5 MG PO TABS: 2.5000 mg | ORAL_TABLET | Freq: Every day | ORAL | Status: DC

## 2014-06-14 NOTE — Progress Notes (Signed)
   Subjective:    Patient ID: Jeanie SewerJames R Carias, male    DOB: 04/11/1971, 43 y.o.   MRN: [id number]  HPI Elevated blood pressure - no CP or SOB.  Has had 3 diet Moutain Dews this AM already.    Carpal tunnel - has been wearing his splints at night.  Says it is improved but hands are still going numb.    His tennis elbow is better overall with stretching.  Was much better and now has flared again the last 3 days since has been back at work .    His 05/27/14 his mother passed away, suddenly.  His father fell and is now in rehab after fracturing multiple ribs. He has dementia. He would like to be out of work for a coule of weeks to work on getting affairs in order since his father has dementia and fore greiving respite.    Review of Systems     Objective:   Physical Exam  Constitutional: He is oriented to person, place, and time. He appears well-developed and well-nourished.  HENT:  Head: Normocephalic and atraumatic.  Cardiovascular: Normal rate, regular rhythm and normal heart sounds.   Pulmonary/Chest: Effort normal and breath sounds normal.  Neurological: He is alert and oriented to person, place, and time.  Skin: Skin is warm and dry.  Psychiatric: He has a normal mood and affect. His behavior is normal.          Assessment & Plan:  HTN- New Dx.  He has tried severeal meds in the past with his previous doctor and says he had sexual concerns with them. Will try low dose amlodpiein. Discussed potential side effects of the medication. He says he will at least try it. I'll see him back in 4-6 weeks to see if he starting it well and to adjust the dose as needed. Strongly encouraged him to cut back on his caffeine and soda intake.  Carpel tunnel syndrome- he is he is probably 50% better with wearing the night splints but is still getting some numbness in his hands. Will refer to Dr. Benjamin Stainhekkekandam for further evaluation and treatment and possible steroid injection around the nerve sheath. If  pain and numbness persist after additional treatment that he may need EMG studies for further evaluation. Next  Tennis elbow-significantly improved but not resolved. Continue current regimen.  Also having pain at the distal bicep. Will have Dr. Benjamin Stainhekkekandam take a look at this as well.

## 2014-06-22 ENCOUNTER — Telehealth: Payer: Self-pay | Admitting: *Deleted

## 2014-06-22 NOTE — Telephone Encounter (Signed)
Pt called and lm stating that his testosterone Rx was not sent and wanted to know what was going on. [facility] and she told me that his rx could not be filled until 11/12. I called the pt back and lmovm and informed him on this and to call back if he has any questions.Loralee PacasBarkley, Jamaine Quintin [place]

## 2014-07-24 ENCOUNTER — Telehealth: Payer: Self-pay

## 2014-07-24 ENCOUNTER — Ambulatory Visit (INDEPENDENT_AMBULATORY_CARE_PROVIDER_SITE_OTHER): Payer: BC Managed Care – PPO

## 2014-07-24 ENCOUNTER — Encounter: Payer: Self-pay | Admitting: Sports Medicine

## 2014-07-24 ENCOUNTER — Ambulatory Visit (INDEPENDENT_AMBULATORY_CARE_PROVIDER_SITE_OTHER): Payer: BC Managed Care – PPO | Admitting: Sports Medicine

## 2014-07-24 VITALS — BP 144/91 | HR 86 | Ht 67.0 in | Wt 251.0 lb

## 2014-07-24 DIAGNOSIS — S43102A Unspecified dislocation of left acromioclavicular joint, initial encounter: Secondary | ICD-10-CM

## 2014-07-24 DIAGNOSIS — S43006A Unspecified dislocation of unspecified shoulder joint, initial encounter: Secondary | ICD-10-CM | POA: Insufficient documentation

## 2014-07-24 DIAGNOSIS — S43005A Unspecified dislocation of left shoulder joint, initial encounter: Secondary | ICD-10-CM

## 2014-07-24 DIAGNOSIS — X58XXXA Exposure to other specified factors, initial encounter: Secondary | ICD-10-CM
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
## 2014-07-24 NOTE — Assessment & Plan Note (Addendum)
Aspiration of 10cc serosanguinous fluid and injection as above. X-rays. Return to see me in one month. Patient declines sling immobilization.

## 2014-07-24 NOTE — Telephone Encounter (Signed)
Pt was seen this afternoon for appt

## 2014-07-25 ENCOUNTER — Ambulatory Visit: Payer: BC Managed Care – PPO | Admitting: Sports Medicine

## 2014-07-28 ENCOUNTER — Ambulatory Visit: Payer: BC Managed Care – PPO | Admitting: Sports Medicine

## 2014-08-09 ENCOUNTER — Ambulatory Visit (INDEPENDENT_AMBULATORY_CARE_PROVIDER_SITE_OTHER): Payer: BC Managed Care – PPO | Admitting: Sports Medicine

## 2014-08-09 ENCOUNTER — Encounter: Payer: Self-pay | Admitting: Sports Medicine

## 2014-08-09 VITALS — BP 152/90 | HR 105 | Ht 67.0 in | Wt 247.0 lb

## 2014-08-09 DIAGNOSIS — G5602 Carpal tunnel syndrome, left upper limb: Secondary | ICD-10-CM | POA: Diagnosis not present

## 2014-08-09 DIAGNOSIS — S43005A Unspecified dislocation of left shoulder joint, initial encounter: Secondary | ICD-10-CM | POA: Diagnosis not present

## 2014-08-09 DIAGNOSIS — G5601 Carpal tunnel syndrome, right upper limb: Secondary | ICD-10-CM

## 2014-08-09 DIAGNOSIS — G5603 Carpal tunnel syndrome, bilateral upper limbs: Secondary | ICD-10-CM | POA: Insufficient documentation

## 2014-08-09 NOTE — Assessment & Plan Note (Signed)
Aspirated a large amount of blood at the last visit. He did decline a sling at that time. Not unexpectedly continues to have discomfort, he does desire referral to shoulder surgery. He is amenable to wear sling now.
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
## 2014-08-09 NOTE — Assessment & Plan Note (Addendum)
Bilateral ultrasound-guided median nerve hydrodissection. Return in one month. He does desire referral to hand surgery.

## 2014-08-17 ENCOUNTER — Telehealth: Payer: Self-pay

## 2014-08-17 NOTE — Telephone Encounter (Signed)
[name] called and wanted get an update on his referral to Ortho.

## 2014-08-20 ENCOUNTER — Other Ambulatory Visit: Payer: Self-pay | Admitting: Physician Assistant

## 2014-09-06 ENCOUNTER — Encounter: Payer: Self-pay | Admitting: Sports Medicine

## 2014-09-06 ENCOUNTER — Ambulatory Visit (INDEPENDENT_AMBULATORY_CARE_PROVIDER_SITE_OTHER): Payer: BLUE CROSS/BLUE SHIELD | Admitting: Sports Medicine

## 2014-09-06 VITALS — BP 176/101 | HR 72 | Wt 244.0 lb

## 2014-09-06 DIAGNOSIS — G5601 Carpal tunnel syndrome, right upper limb: Secondary | ICD-10-CM

## 2014-09-06 DIAGNOSIS — G5602 Carpal tunnel syndrome, left upper limb: Secondary | ICD-10-CM | POA: Diagnosis not present

## 2014-09-06 DIAGNOSIS — S43005D Unspecified dislocation of left shoulder joint, subsequent encounter: Secondary | ICD-10-CM | POA: Diagnosis not present

## 2014-09-06 DIAGNOSIS — G5603 Carpal tunnel syndrome, bilateral upper limbs: Secondary | ICD-10-CM

## 2014-09-06 NOTE — Assessment & Plan Note (Signed)
Resolved after bilateral median nerve hydrodissection at the last visit. He tells me if it does return around 3 months he would rather just have a carpal tunnel release.

## 2014-09-06 NOTE — Progress Notes (Signed)
  Subjective:    CC: Follow-up  HPI: Left shoulder separation: Doing well, improving, he did see Dr. Ave Filterhandler with shoulder surgery who recommended continued conservative care.  Bilateral carpal tunnel syndrome: Resolved after bilateral median nerve hydrodissection of the last visit.  Past medical history, Surgical history, Family history not pertinant except as noted below, Social history, Allergies, and medications have been entered into the medical record, reviewed, and no changes needed.   Review of Systems: No fevers, chills, night sweats, weight loss, chest pain, or shortness of breath.   Objective:    General: Well Developed, well nourished, and in no acute distress.  Neuro: Alert and oriented x3, extra-ocular muscles intact, sensation grossly intact.  HEENT: Normocephalic, atraumatic, pupils equal round reactive to light, neck supple, no masses, no lymphadenopathy, thyroid nonpalpable.  Skin: Warm and dry, no rashes. Cardiac: Regular rate and rhythm, no murmurs rubs or gallops, no lower extremity edema.  Respiratory: Clear to auscultation bilaterally. Not using accessory muscles, speaking in full sentences.  Impression and Recommendations:

## 2014-09-06 NOTE — Assessment & Plan Note (Signed)
Grade 2/3 separation, not treatment without a sling recommended by Dr. Ave Filterhandler. Return as needed for this.

## 2014-09-17 ENCOUNTER — Other Ambulatory Visit: Payer: Self-pay | Admitting: Family Medicine

## 2014-09-27 ENCOUNTER — Other Ambulatory Visit: Payer: Self-pay | Admitting: Family Medicine

## 2014-10-11 ENCOUNTER — Telehealth: Payer: Self-pay | Admitting: Family Medicine

## 2014-10-11 ENCOUNTER — Other Ambulatory Visit: Payer: Self-pay | Admitting: Family Medicine

## 2014-10-11 NOTE — Telephone Encounter (Signed)
Pt called. He wants refill on Sertraline and testosterone meds; appt scheduled for 3/1. His phone # is [phone].  Thank you.

## 2014-10-12 MED ORDER — SERTRALINE HCL 100 MG PO TABS: ORAL_TABLET | ORAL | Status: DC

## 2014-10-12 MED ORDER — TESTOSTERONE CYPIONATE 200 MG/ML IM SOLN: 200.0000 mg | INTRAMUSCULAR | Status: DC

## 2014-10-12 NOTE — Telephone Encounter (Signed)
rx sent.Christian Gregory  

## 2014-10-17 ENCOUNTER — Ambulatory Visit (INDEPENDENT_AMBULATORY_CARE_PROVIDER_SITE_OTHER): Payer: BLUE CROSS/BLUE SHIELD | Admitting: Family Medicine

## 2014-10-17 ENCOUNTER — Encounter: Payer: Self-pay | Admitting: Family Medicine

## 2014-10-17 VITALS — BP 157/87 | HR 88 | Wt 254.0 lb

## 2014-10-17 DIAGNOSIS — I10 Essential (primary) hypertension: Secondary | ICD-10-CM

## 2014-10-17 DIAGNOSIS — F39 Unspecified mood [affective] disorder: Secondary | ICD-10-CM | POA: Diagnosis not present

## 2014-10-17 DIAGNOSIS — S43005D Unspecified dislocation of left shoulder joint, subsequent encounter: Secondary | ICD-10-CM | POA: Diagnosis not present

## 2014-10-17 MED ORDER — SERTRALINE HCL 100 MG PO TABS: ORAL_TABLET | ORAL | Status: DC

## 2014-10-17 MED ORDER — LISINOPRIL 10 MG PO TABS: 10.0000 mg | ORAL_TABLET | Freq: Every day | ORAL | Status: DC

## 2014-10-17 MED ORDER — ALPRAZOLAM 0.5 MG PO TABS: 0.5000 mg | ORAL_TABLET | Freq: Every evening | ORAL | Status: DC | PRN
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
## 2014-10-17 NOTE — Patient Instructions (Signed)
[facility]    [address]   [place], [place]   [phone]

## 2014-10-18 ENCOUNTER — Encounter: Payer: Self-pay | Admitting: Family Medicine

## 2014-11-19 ENCOUNTER — Other Ambulatory Visit: Payer: Self-pay | Admitting: Family Medicine

## 2014-12-25 ENCOUNTER — Encounter: Payer: Self-pay | Admitting: Sports Medicine

## 2014-12-25 ENCOUNTER — Ambulatory Visit (INDEPENDENT_AMBULATORY_CARE_PROVIDER_SITE_OTHER): Payer: BLUE CROSS/BLUE SHIELD | Admitting: Sports Medicine

## 2014-12-25 DIAGNOSIS — M25512 Pain in left shoulder: Secondary | ICD-10-CM | POA: Diagnosis not present

## 2014-12-25 DIAGNOSIS — S43005S Unspecified dislocation of left shoulder joint, sequela: Secondary | ICD-10-CM

## 2014-12-25 NOTE — Progress Notes (Signed)
  Subjective:    CC: left shoulder pain  HPI: Christian Gregory returns, proximally 6 months ago he had a grade 2/3 acromioclavicular separation. We aspirated some blood, injected it and it got slightly better. He did see Dr. Ave Filterhandler which shoulder surgery and no operative intervention was planned. Unfortunately has continued to have severe pain, instability and a grinding sensation.  Past medical history, Surgical history, Family history not pertinant except as noted below, Social history, Allergies, and medications have been entered into the medical record, reviewed, and no changes needed.   Review of Systems: No fevers, chills, night sweats, weight loss, chest pain, or shortness of breath.   Objective:    General: Well Developed, well nourished, and in no acute distress.  Neuro: Alert and oriented x3, extra-ocular muscles intact, sensation grossly intact.  HEENT: Normocephalic, atraumatic, pupils equal round reactive to light, neck supple, no masses, no lymphadenopathy, thyroid nonpalpable.  Skin: Warm and dry, no rashes. Cardiac: Regular rate and rhythm, no murmurs rubs or gallops, no lower extremity edema.  Respiratory: Clear to auscultation bilaterally. Not using accessory muscles, speaking in full sentences. Left Shoulder: Visibly displaced acromioclavicular joint with a palpable fluid wave and effusion. ROM is full in all planes. Rotator cuff strength normal throughout. No signs of impingement with negative Neer and Hawkin's tests, empty can. Speeds and Yergason's tests normal. No labral pathology noted with negative Obrien's, negative crank, negative clunk, and good stability. Normal scapular function observed. No painful arc and no drop arm sign. No apprehension sign  Procedure: Real-time Ultrasound Guided aspiration/Injection of left acromioclavicular joint Device: GE Logiq E  Verbal informed consent obtained.  Time-out conducted.  Noted no overlying erythema, induration, or other  signs of local infection.  Skin prepped in a sterile fashion.  Local anesthesia: Topical Ethyl chloride.  With sterile technique and under real time ultrasound guidance:  Noted effusion, aspirated 4 mL of serosanguineous fluid, syringe switched and 1 mL Kenalog 40, 1 mL lidocaine injected easily.  Completed without difficulty  Pain immediately resolved suggesting accurate placement of the medication.  Advised to call if fevers/chills, erythema, induration, drainage, or persistent bleeding.  Images permanently stored and available for review in the ultrasound unit.  Impression: Technically successful ultrasound guided injection.  Impression and Recommendations:

## 2014-12-25 NOTE — Assessment & Plan Note (Signed)
Christian Gregory is now 6 months post grade 3 left acromioclavicular separation. He has seen Dr. Ave Filterhandler with shoulder surgery. Unfortunately he continues to have pain, and a grinding sensation. We aspirated approximately 4 mL of serosanguineous fluid, and injected a slight amount of medication. I'm going to get an MRI to aid with presurgical planning. He will touch base again with Dr. Ave Filterhandler for consideration of surgical reconstruction.

## 2014-12-26 ENCOUNTER — Telehealth: Payer: Self-pay | Admitting: Sports Medicine

## 2014-12-26 NOTE — Telephone Encounter (Signed)
Spoke to [employer] MD re: shoulder MRI P2P for preop eval grade 3 AC dislocation likely needing operative intervention.    MRI approval: [id number] valid through January 24, 2015.

## 2014-12-27 ENCOUNTER — Other Ambulatory Visit: Payer: Self-pay | Admitting: Sports Medicine

## 2014-12-27 DIAGNOSIS — Z1389 Encounter for screening for other disorder: Secondary | ICD-10-CM

## 2014-12-27 NOTE — Telephone Encounter (Signed)
Imaging notified that Pt is ready for scheduling.

## 2015-01-01 ENCOUNTER — Ambulatory Visit (INDEPENDENT_AMBULATORY_CARE_PROVIDER_SITE_OTHER): Payer: BLUE CROSS/BLUE SHIELD

## 2015-01-01 DIAGNOSIS — X58XXXD Exposure to other specified factors, subsequent encounter: Secondary | ICD-10-CM | POA: Diagnosis not present

## 2015-01-01 DIAGNOSIS — S43102D Unspecified dislocation of left acromioclavicular joint, subsequent encounter: Secondary | ICD-10-CM | POA: Diagnosis not present

## 2015-01-01 DIAGNOSIS — M19012 Primary osteoarthritis, left shoulder: Secondary | ICD-10-CM | POA: Diagnosis not present

## 2015-01-01 DIAGNOSIS — Z1389 Encounter for screening for other disorder: Secondary | ICD-10-CM | POA: Diagnosis not present

## 2015-01-01 DIAGNOSIS — S43005S Unspecified dislocation of left shoulder joint, sequela: Secondary | ICD-10-CM

## 2015-01-22 ENCOUNTER — Ambulatory Visit: Payer: BLUE CROSS/BLUE SHIELD | Admitting: Sports Medicine

## 2015-01-24 ENCOUNTER — Encounter: Payer: Self-pay | Admitting: Gastroenterology

## 2015-01-29 ENCOUNTER — Telehealth: Payer: Self-pay | Admitting: *Deleted

## 2015-01-29 NOTE — Telephone Encounter (Signed)
Pt left vm stating that he is wanting to decrease his sertraline from 100mg  to 50mg .  Is this ok to do when he requests a refill or do you need to see him first? Please advise.

## 2015-01-29 NOTE — Telephone Encounter (Signed)
Ok to go ahead and send new rx for 50mg  daily

## 2015-01-30 ENCOUNTER — Other Ambulatory Visit: Payer: Self-pay | Admitting: *Deleted

## 2015-01-30 MED ORDER — TESTOSTERONE CYPIONATE 200 MG/ML IM SOLN: 200.0000 mg | INTRAMUSCULAR | Status: DC

## 2015-01-30 MED ORDER — SERTRALINE HCL 50 MG PO TABS: 50.0000 mg | ORAL_TABLET | Freq: Every day | ORAL | Status: DC

## 2015-01-30 NOTE — Telephone Encounter (Signed)
Pt notified of rx refills

## 2015-02-26 ENCOUNTER — Other Ambulatory Visit: Payer: Self-pay | Admitting: Family Medicine

## 2015-03-16 ENCOUNTER — Encounter: Payer: Self-pay | Admitting: Family Medicine

## 2015-03-16 ENCOUNTER — Ambulatory Visit (INDEPENDENT_AMBULATORY_CARE_PROVIDER_SITE_OTHER): Payer: BLUE CROSS/BLUE SHIELD

## 2015-03-16 ENCOUNTER — Ambulatory Visit (INDEPENDENT_AMBULATORY_CARE_PROVIDER_SITE_OTHER): Payer: BLUE CROSS/BLUE SHIELD | Admitting: Family Medicine

## 2015-03-16 VITALS — BP 156/97 | HR 73 | Wt 258.0 lb

## 2015-03-16 DIAGNOSIS — R0989 Other specified symptoms and signs involving the circulatory and respiratory systems: Secondary | ICD-10-CM | POA: Diagnosis not present

## 2015-03-16 DIAGNOSIS — R05 Cough: Secondary | ICD-10-CM | POA: Diagnosis not present

## 2015-03-16 DIAGNOSIS — J209 Acute bronchitis, unspecified: Secondary | ICD-10-CM

## 2015-03-16 MED ORDER — IPRATROPIUM-ALBUTEROL 0.5-2.5 (3) MG/3ML IN SOLN
3.0000 mL | Freq: Once | RESPIRATORY_TRACT | Status: AC
  Administered 2015-03-16: 3 mL via RESPIRATORY_TRACT

## 2015-03-16 MED ORDER — GUAIFENESIN-CODEINE 100-10 MG/5ML PO SOLN: 5.0000 mL | Freq: Every evening | ORAL | Status: DC | PRN

## 2015-03-16 MED ORDER — PREDNISONE 50 MG PO TABS: 50.0000 mg | ORAL_TABLET | Freq: Every day | ORAL | Status: DC

## 2015-03-16 MED ORDER — PROAIR RESPICLICK 108 (90 BASE) MCG/ACT IN AEPB: 1.0000 | INHALATION_SPRAY | Freq: Four times a day (QID) | RESPIRATORY_TRACT | Status: DC | PRN

## 2015-03-16 MED ORDER — IPRATROPIUM-ALBUTEROL 0.5-2.5 (3) MG/3ML IN SOLN: 3.0000 mL | Freq: Four times a day (QID) | RESPIRATORY_TRACT | Status: DC

## 2015-03-16 NOTE — Progress Notes (Signed)
Christian Gregory is a 44 y.o. male who presents to [facility]  today for cough congestion and wheezing. Symptoms present for about a month and a half. No leg swelling or pain. Cough is productive. He has not really tried any medications yet. No fevers chills nausea vomiting or diarrhea. He notes his father died on 01/17/2023. This was expected due to his father's poor health. He got a chance to say goodbye before his father died.   Past Medical History  Diagnosis Date  . Anxiety     anger problems  . Hypertension   . Obesity   . Fatty liver   . Eosinophilic esophagitis   . History of methicillin resistant staphylococcus aureus (MRSA)    Past Surgical History  Procedure Laterality Date  . Esophageal dilation     History  Substance Use Topics  . Smoking status: Never Smoker   . Smokeless tobacco: Not on file  . Alcohol Use: Yes     Comment: 6 pack of beer 2-3 x a week   ROS as above Medications: Current Outpatient Prescriptions  Medication Sig Dispense Refill  . lisinopril (PRINIVIL,ZESTRIL) 10 MG tablet TAKE 1 TABLET (10 MG TOTAL) BY MOUTH DAILY. 30 tablet 1  . sertraline (ZOLOFT) 50 MG tablet Take 1 tablet (50 mg total) by mouth daily. 30 tablet 3  . testosterone cypionate (DEPOTESTOTERONE CYPIONATE) 200 MG/ML injection Inject 1 mL (200 mg total) into the muscle every 14 (fourteen) days. 10 mL 0  . guaiFENesin-codeine 100-10 MG/5ML syrup Take 5 mLs by mouth at bedtime as needed for cough. 120 mL 0  . predniSONE (DELTASONE) 50 MG tablet Take 1 tablet (50 mg total) by mouth daily. 5 tablet 0  . PROAIR RESPICLICK 108 (90 BASE) MCG/ACT AEPB Inhale 1-2 Inhalers into the lungs every 6 (six) hours as needed. 1 each 2   No current facility-administered medications for this visit.   Allergies  Allergen Reactions  . Penicillins     REACTION: anaphalatic     Exam:  BP 156/97 mmHg  Pulse 73  Wt 258 lb (117.028 kg) Gen: Well NAD HEENT: EOMI,   MMM Lungs: Normal work of breathing. Some wheezing present with expiratory phase. Heart: RRR no MRG Abd: NABS, Soft. Nondistended, Nontender Exts: Brisk capillary refill, warm and well perfused.   Patient was given a 2.5/0.5 mg DuoNeb nebulizer treatment which helped.  No results found for this or any previous visit (from the past 24 hour(s)). Dg Chest 2 View  03/16/2015   CLINICAL DATA:  Cough and congestion for 1-1/2 weeks  EXAM: CHEST  2 VIEW  COMPARISON:  None.  FINDINGS: The heart size and mediastinal contours are within normal limits. Both lungs are clear allowing for crowding of the bronchovascular markings and hypoaeration. The visualized skeletal structures are unremarkable.  IMPRESSION: Low volume exam without focal acute finding.   Electronically Signed   By: Christiana Pellant M.D.   On: 03/16/2015 11:55     Please see individual assessment and plan sections.

## 2015-03-16 NOTE — Assessment & Plan Note (Signed)
Treat with prednisone and albuterol and codeine cough syrup.

## 2015-03-16 NOTE — Progress Notes (Signed)
Quick Note:  No pneumonia. No changes to the plan. ______

## 2015-03-16 NOTE — Patient Instructions (Signed)
Thank you for coming in today. Take the prednisone daily for 5 days.  Use the albuterol inhaler as needed  Take the cough medicine at bed time as needed.  Call or go to the emergency room if you get worse, have trouble breathing, have chest pains, or palpitations.   Acute Bronchitis Bronchitis is inflammation of the airways that extend from the windpipe into the lungs (bronchi). The inflammation often causes mucus to develop. This leads to a cough, which is the most common symptom of bronchitis.  In acute bronchitis, the condition usually develops suddenly and goes away over time, usually in a couple weeks. Smoking, allergies, and asthma can make bronchitis worse. Repeated episodes of bronchitis may cause further lung problems.  CAUSES Acute bronchitis is most often caused by the same virus that causes a cold. The virus can spread from person to person (contagious) through coughing, sneezing, and touching contaminated objects. SIGNS AND SYMPTOMS   Cough.   Fever.   Coughing up mucus.   Body aches.   Chest congestion.   Chills.   Shortness of breath.   Sore throat.  DIAGNOSIS  Acute bronchitis is usually diagnosed through a physical exam. Your health care provider will also ask you questions about your medical history. Tests, such as chest X-rays, are sometimes done to rule out other conditions.  TREATMENT  Acute bronchitis usually goes away in a couple weeks. Oftentimes, no medical treatment is necessary. Medicines are sometimes given for relief of fever or cough. Antibiotic medicines are usually not needed but may be prescribed in certain situations. In some cases, an inhaler may be recommended to help reduce shortness of breath and control the cough. A cool mist vaporizer may also be used to help thin bronchial secretions and make it easier to clear the chest.  HOME CARE INSTRUCTIONS  Get plenty of rest.   Drink enough fluids to keep your urine clear or pale yellow  (unless you have a medical condition that requires fluid restriction). Increasing fluids may help thin your respiratory secretions (sputum) and reduce chest congestion, and it will prevent dehydration.   Take medicines only as directed by your health care provider.  If you were prescribed an antibiotic medicine, finish it all even if you start to feel better.  Avoid smoking and secondhand smoke. Exposure to cigarette smoke or irritating chemicals will make bronchitis worse. If you are a smoker, consider using nicotine gum or skin patches to help control withdrawal symptoms. Quitting smoking will help your lungs heal faster.   Reduce the chances of another bout of acute bronchitis by washing your hands frequently, avoiding people with cold symptoms, and trying not to touch your hands to your mouth, nose, or eyes.   Keep all follow-up visits as directed by your health care provider.  SEEK MEDICAL CARE IF: Your symptoms do not improve after 1 week of treatment.  SEEK IMMEDIATE MEDICAL CARE IF:  You develop an increased fever or chills.   You have chest pain.   You have severe shortness of breath.  You have bloody sputum.   You develop dehydration.  You faint or repeatedly feel like you are going to pass out.  You develop repeated vomiting.  You develop a severe headache. MAKE SURE YOU:   Understand these instructions.  Will watch your condition.  Will get help right away if you are not doing well or get worse. Document Released: 09/11/2004 Document Revised: 12/19/2013 Document Reviewed: 01/25/2013 [facility] Patient Information 2015 [place], [place]. This  information is not intended to replace advice given to you by your health care provider. Make sure you discuss any questions you have with your health care provider.

## 2015-03-26 ENCOUNTER — Other Ambulatory Visit: Payer: Self-pay | Admitting: Family Medicine

## 2015-03-26 MED ORDER — SERTRALINE HCL 50 MG PO TABS: 50.0000 mg | ORAL_TABLET | Freq: Every day | ORAL | Status: DC

## 2015-03-26 MED ORDER — LISINOPRIL 10 MG PO TABS: ORAL_TABLET | ORAL | Status: DC

## 2015-05-11 ENCOUNTER — Other Ambulatory Visit: Payer: Self-pay | Admitting: Family Medicine

## 2015-05-14 NOTE — Telephone Encounter (Signed)
Called Pt, left message informing it was time for routine follow up with PCP and time for lab work. Callback information provided to schedule.

## 2015-06-05 ENCOUNTER — Ambulatory Visit (INDEPENDENT_AMBULATORY_CARE_PROVIDER_SITE_OTHER): Payer: Self-pay | Admitting: Rehabilitative and Restorative Service Providers"

## 2015-06-05 ENCOUNTER — Encounter: Payer: Self-pay | Admitting: Rehabilitative and Restorative Service Providers"

## 2015-06-05 DIAGNOSIS — M25512 Pain in left shoulder: Secondary | ICD-10-CM

## 2015-06-05 NOTE — Therapy (Signed)
[facility] [address] [place], [place], [address] Phone: [phone]   Fax:  [phone]  Patient Details  Name: Christian Gregory MRN: [id number] Date of Birth: 05/30/1971 Referring Provider:  Francena HanlySupple, Kevin, MD  Encounter Date: 06/05/2015   Patient arrived for PT evaluation. He elected not to stay for evaluation due to change in insurance coverage. He was not assessed and no treatment was given.    Lerone Onder Rober Minion Taziah Difatta PT, [facility] 06/05/2015, 11:55 AM  [facility] [address] [place], [place], [address] Phone: [phone]   Fax:  [phone]

## 2015-06-29 ENCOUNTER — Other Ambulatory Visit: Payer: Self-pay | Admitting: Family Medicine

## 2015-08-30 ENCOUNTER — Encounter: Payer: Self-pay | Admitting: Family Medicine

## 2015-08-30 ENCOUNTER — Ambulatory Visit (INDEPENDENT_AMBULATORY_CARE_PROVIDER_SITE_OTHER): Payer: PRIVATE HEALTH INSURANCE | Admitting: Family Medicine

## 2015-08-30 VITALS — BP 142/85 | HR 71 | Ht 67.0 in | Wt 254.0 lb

## 2015-08-30 DIAGNOSIS — J019 Acute sinusitis, unspecified: Secondary | ICD-10-CM | POA: Diagnosis not present

## 2015-08-30 DIAGNOSIS — J209 Acute bronchitis, unspecified: Secondary | ICD-10-CM | POA: Diagnosis not present

## 2015-08-30 MED ORDER — BENZONATATE 200 MG PO CAPS: 200.0000 mg | ORAL_CAPSULE | Freq: Three times a day (TID) | ORAL | Status: DC | PRN

## 2015-08-30 MED ORDER — HYDROCODONE-HOMATROPINE 5-1.5 MG/5ML PO SYRP: 5.0000 mL | ORAL_SOLUTION | Freq: Every evening | ORAL | Status: DC | PRN

## 2015-08-30 MED ORDER — AZITHROMYCIN 250 MG PO TABS: ORAL_TABLET | ORAL | Status: AC

## 2015-08-30 MED ORDER — PREDNISONE 20 MG PO TABS: 40.0000 mg | ORAL_TABLET | Freq: Every day | ORAL | Status: DC

## 2015-08-30 NOTE — Progress Notes (Signed)
   Subjective:    Patient ID: Christian SewerJames R Guilford, male    DOB: 12/01/1970, 45 y.o.   MRN: [id number]  HPI He complains of nasal congestion and pressure and loss of smell for almost 2 months. No fevers chills or sweats. No cough. Has been using some albuterol and that does help with the shortness of breath and wheezing that he has been experiencing. He has had some pressure in both ears but no pain. Some mild sore throat. He also thinks he may have portal but muscle in his left low back from coughing. He would like something for cough or bedtime as well.   Review of Systems     Objective:   Physical Exam  Constitutional: He is oriented to person, place, and time. He appears well-developed and well-nourished.  HENT:  Head: Normocephalic and atraumatic.  Right Ear: External ear normal.  Left Ear: External ear normal.  Nose: Nose normal.  Mouth/Throat: Oropharynx is clear and moist.  TMs and canals are clear.   Eyes: Conjunctivae and EOM are normal. Pupils are equal, round, and reactive to light.  Neck: Neck supple. No thyromegaly present.  Cardiovascular: Normal rate and normal heart sounds.   Pulmonary/Chest: Effort normal and breath sounds normal.  Diffuse expiratory wheezing.    Lymphadenopathy:    He has no cervical adenopathy.  Neurological: He is alert and oriented to person, place, and time.  Skin: Skin is warm and dry.  Psychiatric: He has a normal mood and affect.          Assessment & Plan:  Acute sinusitis with bronchitis-with azithromycin since he has a penicillin allergy. We'll also get 5 days of prednisone since he has bronchitis with wheezing as well. He does have some albuterol at home. Call back if need a refill. Given Tessalon Perles for daytime cough as needed and can use cough syrup at bedtime. Did encourage him to use it sparingly since he does seem to cough some to help move the mucus out of his chest.  He plans on scheduling a physical next month

## 2015-08-30 NOTE — Patient Instructions (Signed)
Sinusitis, Adult °Sinusitis is redness, soreness, and inflammation of the paranasal sinuses. Paranasal sinuses are air pockets within the bones of your face. They are located beneath your eyes, in the middle of your forehead, and above your eyes. In healthy paranasal sinuses, mucus is able to drain out, and air is able to circulate through them by way of your nose. However, when your paranasal sinuses are inflamed, mucus and air can become trapped. This can allow bacteria and other germs to grow and cause infection. °Sinusitis can develop quickly and last only a short time (acute) or continue over a long period (chronic). Sinusitis that lasts for more than 12 weeks is considered chronic. °CAUSES °Causes of sinusitis include: °· Allergies. °· Structural abnormalities, such as displacement of the cartilage that separates your nostrils (deviated septum), which can decrease the air flow through your nose and sinuses and affect sinus drainage. °· Functional abnormalities, such as when the small hairs (cilia) that line your sinuses and help remove mucus do not work properly or are not present. °SIGNS AND SYMPTOMS °Symptoms of acute and chronic sinusitis are the same. The primary symptoms are pain and pressure around the affected sinuses. Other symptoms include: °· Upper toothache. °· Earache. °· Headache. °· Bad breath. °· Decreased sense of smell and taste. °· A cough, which worsens when you are lying flat. °· Fatigue. °· Fever. °· Thick drainage from your nose, which often is green and may contain pus (purulent). °· Swelling and warmth over the affected sinuses. °DIAGNOSIS °Your health care provider will perform a physical exam. During your exam, your health care provider may perform any of the following to help determine if you have acute sinusitis or chronic sinusitis: °· Look in your nose for signs of abnormal growths in your nostrils (nasal polyps). °· Tap over the affected sinus to check for signs of  infection. °· View the inside of your sinuses using an imaging device that has a light attached (endoscope). °If your health care provider suspects that you have chronic sinusitis, one or more of the following tests may be recommended: °· Allergy tests. °· Nasal culture. A sample of mucus is taken from your nose, sent to a lab, and screened for bacteria. °· Nasal cytology. A sample of mucus is taken from your nose and examined by your health care provider to determine if your sinusitis is related to an allergy. °TREATMENT °Most cases of acute sinusitis are related to a viral infection and will resolve on their own within 10 days. Sometimes, medicines are prescribed to help relieve symptoms of both acute and chronic sinusitis. These may include pain medicines, decongestants, nasal steroid sprays, or saline sprays. °However, for sinusitis related to a bacterial infection, your health care provider will prescribe antibiotic medicines. These are medicines that will help kill the bacteria causing the infection. °Rarely, sinusitis is caused by a fungal infection. In these cases, your health care provider will prescribe antifungal medicine. °For some cases of chronic sinusitis, surgery is needed. Generally, these are cases in which sinusitis recurs more than 3 times per year, despite other treatments. °HOME CARE INSTRUCTIONS °· Drink plenty of water. Water helps thin the mucus so your sinuses can drain more easily. °· Use a humidifier. °· Inhale steam 3-4 times a day (for example, sit in the bathroom with the shower running). °· Apply a warm, moist washcloth to your face 3-4 times a day, or as directed by your health care provider. °· Use saline nasal sprays to help   moisten and clean your sinuses. °· Take medicines only as directed by your health care provider. °· If you were prescribed either an antibiotic or antifungal medicine, finish it all even if you start to feel better. °SEEK IMMEDIATE MEDICAL CARE IF: °· You have  increasing pain or severe headaches. °· You have nausea, vomiting, or drowsiness. °· You have swelling around your face. °· You have vision problems. °· You have a stiff neck. °· You have difficulty breathing. °  °This information is not intended to replace advice given to you by your health care provider. Make sure you discuss any questions you have with your health care provider. °  °Document Released: 08/04/2005 Document Revised: 08/25/2014 Document Reviewed: 08/19/2011 °Elsevier Interactive Patient Education ©2016 [employer] ° °Acute Bronchitis °Bronchitis is inflammation of the airways that extend from the windpipe into the lungs (bronchi). The inflammation often causes mucus to develop. This leads to a cough, which is the most common symptom of bronchitis.  °In acute bronchitis, the condition usually develops suddenly and goes away over time, usually in a couple weeks. Smoking, allergies, and asthma can make bronchitis worse. Repeated episodes of bronchitis may cause further lung problems.  °CAUSES °Acute bronchitis is most often caused by the same virus that causes a cold. The virus can spread from person to person (contagious) through coughing, sneezing, and touching contaminated objects. °SIGNS AND SYMPTOMS  °· Cough.   °· Fever.   °· Coughing up mucus.   °· Body aches.   °· Chest congestion.   °· Chills.   °· Shortness of breath.   °· Sore throat.   °DIAGNOSIS  °Acute bronchitis is usually diagnosed through a physical exam. Your health care provider will also ask you questions about your medical history. Tests, such as chest X-rays, are sometimes done to rule out other conditions.  °TREATMENT  °Acute bronchitis usually goes away in a couple weeks. Oftentimes, no medical treatment is necessary. Medicines are sometimes given for relief of fever or cough. Antibiotic medicines are usually not needed but may be prescribed in certain situations. In some cases, an inhaler may be recommended to help reduce  shortness of breath and control the cough. A cool mist vaporizer may also be used to help thin bronchial secretions and make it easier to clear the chest.  °HOME CARE INSTRUCTIONS °· Get plenty of rest.   °· Drink enough fluids to keep your urine clear or pale yellow (unless you have a medical condition that requires fluid restriction). Increasing fluids may help thin your respiratory secretions (sputum) and reduce chest congestion, and it will prevent dehydration.   °· Take medicines only as directed by your health care provider. °· If you were prescribed an antibiotic medicine, finish it all even if you start to feel better. °· Avoid smoking and secondhand smoke. Exposure to cigarette smoke or irritating chemicals will make bronchitis worse. If you are a smoker, consider using nicotine gum or skin patches to help control withdrawal symptoms. Quitting smoking will help your lungs heal faster.   °· Reduce the chances of another bout of acute bronchitis by washing your hands frequently, avoiding people with cold symptoms, and trying not to touch your hands to your mouth, nose, or eyes.   °· Keep all follow-up visits as directed by your health care provider.   °SEEK MEDICAL CARE IF: °Your symptoms do not improve after 1 week of treatment.  °SEEK IMMEDIATE MEDICAL CARE IF: °· You develop an increased fever or chills.   °· You have chest pain.   °· You have severe shortness of   breath. °· You have bloody sputum.   °· You develop dehydration. °· You faint or repeatedly feel like you are going to pass out. °· You develop repeated vomiting. °· You develop a severe headache. °MAKE SURE YOU:  °· Understand these instructions. °· Will watch your condition. °· Will get help right away if you are not doing well or get worse. °  °This information is not intended to replace advice given to you by your health care provider. Make sure you discuss any questions you have with your health care provider. °  °Document Released:  09/11/2004 Document Revised: 08/25/2014 Document Reviewed: 01/25/2013 °Elsevier Interactive Patient Education ©2016 [employer] Inc. ° °

## 2015-09-20 ENCOUNTER — Encounter: Payer: PRIVATE HEALTH INSURANCE | Admitting: Family Medicine

## 2015-09-26 ENCOUNTER — Other Ambulatory Visit: Payer: Self-pay | Admitting: *Deleted

## 2015-09-26 MED ORDER — OSELTAMIVIR PHOSPHATE 75 MG PO CAPS: 75.0000 mg | ORAL_CAPSULE | Freq: Every day | ORAL | Status: DC

## 2015-09-27 ENCOUNTER — Other Ambulatory Visit: Payer: Self-pay | Admitting: Family Medicine

## 2015-09-27 ENCOUNTER — Encounter: Payer: Self-pay | Admitting: Family Medicine

## 2015-09-27 ENCOUNTER — Ambulatory Visit (INDEPENDENT_AMBULATORY_CARE_PROVIDER_SITE_OTHER): Payer: BLUE CROSS/BLUE SHIELD | Admitting: Family Medicine

## 2015-09-27 VITALS — BP 152/73 | HR 68 | Wt 254.0 lb

## 2015-09-27 DIAGNOSIS — Z0000 Encounter for general adult medical examination without abnormal findings: Secondary | ICD-10-CM

## 2015-09-27 DIAGNOSIS — R635 Abnormal weight gain: Secondary | ICD-10-CM

## 2015-09-27 DIAGNOSIS — Z114 Encounter for screening for human immunodeficiency virus [HIV]: Secondary | ICD-10-CM | POA: Diagnosis not present

## 2015-09-27 DIAGNOSIS — Z6839 Body mass index (BMI) 39.0-39.9, adult: Secondary | ICD-10-CM | POA: Diagnosis not present

## 2015-09-27 DIAGNOSIS — R7301 Impaired fasting glucose: Secondary | ICD-10-CM | POA: Diagnosis not present

## 2015-09-27 DIAGNOSIS — I10 Essential (primary) hypertension: Secondary | ICD-10-CM

## 2015-09-27 DIAGNOSIS — E291 Testicular hypofunction: Secondary | ICD-10-CM

## 2015-09-27 LAB — POCT GLYCOSYLATED HEMOGLOBIN (HGB A1C): Hemoglobin A1C: 6

## 2015-09-27 MED ORDER — ALPRAZOLAM 0.5 MG PO TABS: 0.5000 mg | ORAL_TABLET | Freq: Every evening | ORAL | Status: DC | PRN

## 2015-09-27 MED ORDER — LIRAGLUTIDE -WEIGHT MANAGEMENT 18 MG/3ML ~~LOC~~ SOPN: 0.6000 mg | PEN_INJECTOR | Freq: Every day | SUBCUTANEOUS | Status: DC

## 2015-09-27 MED ORDER — TESTOSTERONE CYPIONATE 200 MG/ML IM SOLN: INTRAMUSCULAR | Status: DC

## 2015-09-27 MED ORDER — LEVOFLOXACIN 500 MG PO TABS: 500.0000 mg | ORAL_TABLET | Freq: Every day | ORAL | Status: AC

## 2015-09-27 NOTE — Progress Notes (Signed)
Subjective:    Patient ID: Christian Gregory, male    DOB: 01-26-1971, 45 y.o.   MRN: [id number]  HPI  CPE - you for complete physical exam today. He would like to focus back on weight loss. He says he is physically active at home at work but doesn't have a specific exercise routine. His really try to cut back on soda and has been drinking green tea. He did switch to diet recently. He said he actually got down around 230-pounds around the holidays but after starting back at work in January after his shoulder surgery he is actually gained weight. He says they have a lunch time and to breaks and typically he eats at each of those and so he's been eating more food since being back at work.  Abnormal weight gain - he wants to consider trying the phentermine again. He has been trying to cut out soda.  He is active but does not have a specific exercise routine. He really doesn't track his calories. He was on phentermine previously and did well with that. He is most interested in restarting that again.  IFG - no increased thirst or urination.   He still feels sick. About 2 Weeks Ago He Was Seen for Sinusitis and Bronchitis and Treated Him with Azithromycin. He Says He Got a Little Better but Is Still Congested and Having A Lot Of Frontal Pressure Just above His Eyebrow Ridge in the Center of the Head. Still Has Some Wheezing and Cough.   Review of Systems Review of systems negative except for that mentioned in the history of present illness.  BP 152/73 mmHg  Pulse 68  Wt 254 lb (115.214 kg)  SpO2 98%    Allergies  Allergen Reactions  . Penicillins     REACTION: anaphalatic    Past Medical History  Diagnosis Date  . Anxiety     anger problems  . Hypertension   . Obesity   . Fatty liver   . Eosinophilic esophagitis   . History of methicillin resistant staphylococcus aureus (MRSA)     Past Surgical History  Procedure Laterality Date  . Esophageal dilation      Social History    Social History  . Marital Status: Married    Spouse Name: N/A  . Number of Children: N/A  . Years of Education: N/A   Occupational History  . Not on file.   Social History Main Topics  . Smoking status: Never Smoker   . Smokeless tobacco: Not on file  . Alcohol Use: Yes     Comment: 6 pack of beer 2-3 x a week  . Drug Use: No  . Sexual Activity: Yes   Other Topics Concern  . Not on file   Social History Narrative    Family History  Problem Relation Age of Onset  . Hypertension Mother     father  . Stroke Father     Outpatient Encounter Prescriptions as of 09/27/2015  Medication Sig  . benzonatate (TESSALON) 200 MG capsule Take 1 capsule (200 mg total) by mouth 3 (three) times daily as needed for cough.  . testosterone cypionate (DEPOTESTOSTERONE CYPIONATE) 200 MG/ML injection INJECT 1 ML (200 MG) INTO THE MUSCLE EVERY 14 DAYS AS DIRECTED.  . [DISCONTINUED] testosterone cypionate (DEPOTESTOSTERONE CYPIONATE) 200 MG/ML injection INJECT 1 ML (200 MG) INTO THE MUSCLE EVERY 14 DAYS AS DIRECTED. Time for yearly blood work.  . ALPRAZolam (XANAX) 0.5 MG tablet Take 1 tablet (0.5 mg total)  by mouth at bedtime as needed.  Marland Kitchen levofloxacin (LEVAQUIN) 500 MG tablet Take 1 tablet (500 mg total) by mouth daily.  . Liraglutide -Weight Management (SAXENDA) 18 MG/3ML SOPN Inject 0.6 mg into the skin daily.  . [DISCONTINUED] HYDROcodone-homatropine (HYCODAN) 5-1.5 MG/5ML syrup Take 5 mLs by mouth at bedtime as needed for cough.  . [DISCONTINUED] oseltamivir (TAMIFLU) 75 MG capsule Take 1 capsule (75 mg total) by mouth daily.  . [DISCONTINUED] predniSONE (DELTASONE) 20 MG tablet Take 2 tablets (40 mg total) by mouth daily.   No facility-administered encounter medications on file as of 09/27/2015.          Objective:   Physical Exam  Constitutional: He is oriented to person, place, and time. He appears well-developed and well-nourished.  HENT:  Head: Normocephalic and atraumatic.   Right Ear: External ear normal.  Left Ear: External ear normal.  Nose: Nose normal.  Mouth/Throat: Oropharynx is clear and moist.  Eyes: Conjunctivae and EOM are normal. Pupils are equal, round, and reactive to light.  Neck: Normal range of motion. Neck supple. No thyromegaly present.  Cardiovascular: Normal rate, regular rhythm, normal heart sounds and intact distal pulses.   Pulmonary/Chest: Effort normal. He has wheezes.  Diffuse wheezing and rhonchi  Abdominal: Soft. Bowel sounds are normal. He exhibits no distension and no mass. There is no tenderness. There is no rebound and no guarding.  Musculoskeletal: Normal range of motion.  Lymphadenopathy:    He has no cervical adenopathy.  Neurological: He is alert and oriented to person, place, and time. He has normal reflexes.  Skin: Skin is warm and dry.  Psychiatric: He has a normal mood and affect. His behavior is normal. Judgment and thought content normal.          Assessment & Plan:  CPE-  Keep up a regular exercise program and make sure you are eating a healthy diet Try to eat 4 servings of dairy a day, or if you are lactose intolerant take a calcium with vitamin D daily.  Your vaccines are up to date.   Acute sinusitis/boncnhitis - will treat with Levaquin since he is still not feeling well after a course of azithromycin approximately 2 weeks ago. If he is not better at the end of course of antibiotics and recommend chest x-ray for further evaluation.  Impaired fasting glucose-A1c of 6.0 today. A couple little bit from previous of 5.7. Work on diet exercise and weight loss. I plan to see him get closer to about 200 pounds.  Abnormal weight gain-discussed options. He may be a candidate for the Sexenda. Especially because his blood pressures not well controlled at this point.  Hypertension-blood pressure has been high the last couple times he is been here. He really wants to work on diet and exercise and get this under  control without taking medication. He said he just didn't feel well when he was taking the hydrochlorothiazide and lisinopril. Him that I really like him to come back in a few weeks when is feeling better to have his blood pressure rechecked. If it's still high but he still needs to be on medication that we might be able to find something else if he tolerates better.

## 2015-09-27 NOTE — Patient Instructions (Signed)
Bernie Covey can be an injection that can help you lose.

## 2015-09-28 ENCOUNTER — Telehealth: Payer: Self-pay

## 2015-09-28 DIAGNOSIS — K760 Fatty (change of) liver, not elsewhere classified: Secondary | ICD-10-CM

## 2015-09-28 LAB — LIPID PANEL
Cholesterol: 234 mg/dL — ABNORMAL HIGH (ref 125–200)
HDL: 41 mg/dL (ref >=40)
Total CHOL/HDL Ratio: 5.7 Ratio — ABNORMAL HIGH (ref <=5.0)
Triglycerides: 406 mg/dL — ABNORMAL HIGH (ref <150)

## 2015-09-28 LAB — COMPLETE METABOLIC PANEL WITH GFR
ALT: 47 U/L — AB (ref 9–46)
AST: 27 U/L (ref 10–40)
Albumin: 4.3 g/dL (ref 3.6–5.1)
Alkaline Phosphatase: 54 U/L (ref 40–115)
BUN: 13 mg/dL (ref 7–25)
CHLORIDE: 103 mmol/L (ref 98–110)
CO2: 27 mmol/L (ref 20–31)
CREATININE: 0.9 mg/dL (ref 0.60–1.35)
Calcium: 9.4 mg/dL (ref 8.6–10.3)
GFR, Est African American: >89 mL/min (ref >=60)
GFR, Est Non African American: >89 mL/min (ref >=60)
GLUCOSE: 91 mg/dL (ref 65–99)
Potassium: 4.3 mmol/L (ref 3.5–5.3)
Sodium: 141 mmol/L (ref 135–146)
Total Bilirubin: 0.6 mg/dL (ref 0.2–1.2)
Total Protein: 7.3 g/dL (ref 6.1–8.1)

## 2015-09-28 LAB — LDL CHOLESTEROL, DIRECT: Direct LDL: 145 mg/dL — ABNORMAL HIGH (ref <130)

## 2015-09-28 LAB — PSA: PSA: 0.62 ng/mL (ref <=4.00)

## 2015-09-28 LAB — TESTOSTERONE: TESTOSTERONE: 146 ng/dL — AB (ref 250–827)

## 2015-09-28 LAB — HIV ANTIBODY (ROUTINE TESTING W REFLEX): HIV: NONREACTIVE

## 2015-09-28 NOTE — Telephone Encounter (Signed)
Labs have been ordered per Dr. Eppie Gibson. Rhonda Cunningham,CMA

## 2015-10-02 ENCOUNTER — Encounter: Payer: Self-pay | Admitting: Family Medicine

## 2015-10-02 DIAGNOSIS — E785 Hyperlipidemia, unspecified: Secondary | ICD-10-CM | POA: Insufficient documentation

## 2015-10-16 ENCOUNTER — Ambulatory Visit (INDEPENDENT_AMBULATORY_CARE_PROVIDER_SITE_OTHER): Payer: BLUE CROSS/BLUE SHIELD

## 2015-10-16 ENCOUNTER — Encounter: Payer: Self-pay | Admitting: Family Medicine

## 2015-10-16 ENCOUNTER — Ambulatory Visit (INDEPENDENT_AMBULATORY_CARE_PROVIDER_SITE_OTHER): Payer: BLUE CROSS/BLUE SHIELD | Admitting: Family Medicine

## 2015-10-16 VITALS — BP 143/80 | HR 96 | Ht 67.0 in | Wt 258.0 lb

## 2015-10-16 DIAGNOSIS — J320 Chronic maxillary sinusitis: Secondary | ICD-10-CM | POA: Diagnosis not present

## 2015-10-16 DIAGNOSIS — R059 Cough, unspecified: Secondary | ICD-10-CM

## 2015-10-16 DIAGNOSIS — R05 Cough: Secondary | ICD-10-CM

## 2015-10-16 DIAGNOSIS — J9811 Atelectasis: Secondary | ICD-10-CM

## 2015-10-16 NOTE — Progress Notes (Signed)
   Subjective:    Patient ID: Christian Gregory, male    DOB: 1971/06/04, 45 y.o.   MRN: [id number]  HPI Still not feeling well. I saw him over 6 weeks ago for acute sinusitis. I placed him on azithromycin. He did not improve and and seeing him again about 3 weeks ago. At that point I put him on prednisone and Levaquin. He was still having a lot of sinus pain and pressure around his eyes and a lot of nasal congestion and cough. No fevers chills or sweats. He did complete the course of Levaquin and prednisone. He says he still doesn't feel as bad as he did but still doesn't feel great. He still short of breath at times and still persistently coughing. He still feels rundown and tired.   Review of Systems     Objective:   Physical Exam  Constitutional: He is oriented to person, place, and time. He appears well-developed and well-nourished.  HENT:  Head: Normocephalic and atraumatic.  Right Ear: External ear normal.  Left Ear: External ear normal.  Nose: Nose normal.  Mouth/Throat: Oropharynx is clear and moist.  TMs and canals are clear.   Eyes: Conjunctivae and EOM are normal. Pupils are equal, round, and reactive to light.  Neck: Neck supple. No thyromegaly present.  Cardiovascular: Normal rate and normal heart sounds.   Pulmonary/Chest: Effort normal and breath sounds normal.  Lymphadenopathy:    He has no cervical adenopathy.  Neurological: He is alert and oriented to person, place, and time.  Skin: Skin is warm and dry.  Psychiatric: He has a normal mood and affect.          Assessment & Plan:  Sinusitis, chronic-I would like to get a chest x-ray since he has had a cough for over a month I suspect it's most likely drainage from the sinus infection. If the chest x-ray is fairly normal then we'll treat him for chronic sinusitis. Given nebulizer treatment here in the office. He only noticed minimal improvement in his breathing. Sox was normal.

## 2015-10-17 MED ORDER — CLARITHROMYCIN 500 MG PO TABS: 500.0000 mg | ORAL_TABLET | Freq: Two times a day (BID) | ORAL | Status: DC

## 2015-10-17 MED ORDER — FLUTICASONE PROPIONATE 50 MCG/ACT NA SUSP: 2.0000 | Freq: Every day | NASAL | Status: DC

## 2015-11-21 ENCOUNTER — Ambulatory Visit (INDEPENDENT_AMBULATORY_CARE_PROVIDER_SITE_OTHER): Payer: BLUE CROSS/BLUE SHIELD

## 2015-11-21 ENCOUNTER — Ambulatory Visit (INDEPENDENT_AMBULATORY_CARE_PROVIDER_SITE_OTHER): Payer: BLUE CROSS/BLUE SHIELD | Admitting: Family Medicine

## 2015-11-21 ENCOUNTER — Encounter: Payer: Self-pay | Admitting: Family Medicine

## 2015-11-21 VITALS — BP 154/75 | HR 79 | Ht 67.0 in | Wt 255.0 lb

## 2015-11-21 DIAGNOSIS — R062 Wheezing: Secondary | ICD-10-CM | POA: Diagnosis not present

## 2015-11-21 DIAGNOSIS — J329 Chronic sinusitis, unspecified: Secondary | ICD-10-CM

## 2015-11-21 LAB — CBC WITH DIFFERENTIAL/PLATELET
BASOS ABS: 81 {cells}/uL (ref 0–200)
BASOS PCT: 1 %
EOS ABS: 567 {cells}/uL — AB (ref 15–500)
EOS PCT: 7 %
HCT: 51.9 % — ABNORMAL HIGH (ref 38.5–50.0)
Hemoglobin: 17.9 g/dL — ABNORMAL HIGH (ref 13.2–17.1)
LYMPHS ABS: 2754 {cells}/uL (ref 850–3900)
Lymphocytes Relative: 34 %
MCH: 31.7 pg (ref 27.0–33.0)
MCHC: 34.5 g/dL (ref 32.0–36.0)
MCV: 91.9 fL (ref 80.0–100.0)
MONO ABS: 729 {cells}/uL (ref 200–950)
MPV: 10.3 fL (ref 7.5–12.5)
Monocytes Relative: 9 %
NEUTROS ABS: 3969 {cells}/uL (ref 1500–7800)
NEUTROS PCT: 49 %
PLATELETS: 312 10*3/uL (ref 140–400)
RBC: 5.65 MIL/uL (ref 4.20–5.80)
RDW: 12.6 % (ref 11.0–15.0)
WBC: 8.1 10*3/uL (ref 3.8–10.8)

## 2015-11-21 MED ORDER — LEVOFLOXACIN 500 MG PO TABS: 500.0000 mg | ORAL_TABLET | Freq: Every day | ORAL | Status: DC

## 2015-11-21 MED ORDER — IPRATROPIUM-ALBUTEROL 0.5-2.5 (3) MG/3ML IN SOLN
3.0000 mL | Freq: Once | RESPIRATORY_TRACT | Status: AC
  Administered 2015-11-21: 3 mL via RESPIRATORY_TRACT

## 2015-11-21 MED ORDER — PREDNISONE 20 MG PO TABS: 40.0000 mg | ORAL_TABLET | Freq: Every day | ORAL | Status: DC

## 2015-11-21 NOTE — Progress Notes (Signed)
   Subjective:    Patient ID: Christian Gregory, male    DOB: 03/30/1971, 45 y.o.   MRN: [id number]  HPI Patient is here today for follow-up. I saw him about 5 weeks ago and felt that his symptoms were most consistent with a chronic sinusitis. We did a chest x-ray that day because he also had some wheezing. Chest x-ray was essentially negative. We treated him with 4 weeks of Biaxin as well as fluticasone nasal spray and he is also doing nasal saline rinses. He said he completed his buttocks about 2 days ago and says he really doesn't feel any better. He still has a lot of nasal congestion and a lot of pressure under both eyes and across the nasal bridge. He is still wheezing and using the albuterol. He does get some temporary relief when he uses it and says it actually gets his cough to calm down for a short period of time. No prior history of asthma.  Cough has been more productive the last 2 days.    Grew up around parents who smoked.  He did take zyrtec once this week.    Review of Systems     Objective:   Physical Exam  Constitutional: He is oriented to person, place, and time. He appears well-developed and well-nourished.  HENT:  Head: Normocephalic and atraumatic.  Right Ear: External ear normal.  Left Ear: External ear normal.  Nose: Nose normal.  Mouth/Throat: Oropharynx is clear and moist.  TMs and canals are clear. Tonsils are large but not erythema.    Eyes: Conjunctivae and EOM are normal. Pupils are equal, round, and reactive to light.  Neck: Neck supple. No thyromegaly present.  Cardiovascular: Normal rate and normal heart sounds.   Pulmonary/Chest: Effort normal. He has wheezes.  Diffuse expiratory wheezing.   Lymphadenopathy:    He has no cervical adenopathy.  Neurological: He is alert and oriented to person, place, and time.  Skin: Skin is warm and dry.  Psychiatric: He has a normal mood and affect.        Assessment & Plan:  Chronic sinusitis - We'll move forward with  sinus CT since he is not improved after 4 weeks of Biaxin, nasal steroid spray and nasal saline irrigation. He did note that his brother has a history of nasal polyps. The pending on what we find consider ENT referral.  Wheezing - Peak flow borderline for yellow zone. Given albuterol nebulizer treatment here in the office. We'll did check a CBC with differential and also do allergy testing. He can go to the lab

## 2015-11-22 LAB — MIDATLANTIC REGIONAL ALLERGY PANEL (DC,DE,MD,~~LOC~~,VA,WV)
Allergen, Cottonwood, t14: <0.1 kU/L
Allergen, Oak,t7: 0.11 kU/L — ABNORMAL HIGH
Allergen, Walnut,t10: <0.1 kU/L
Alternaria Alternata: 0.63 kU/L — ABNORMAL HIGH
Common Ragweed: 1 kU/L — ABNORMAL HIGH
Elm IgE: <0.1 kU/L
MEADOW GRASS: 0.15 kU/L — AB

## 2016-01-08 ENCOUNTER — Encounter: Payer: Self-pay | Admitting: Family Medicine

## 2016-01-08 ENCOUNTER — Ambulatory Visit (INDEPENDENT_AMBULATORY_CARE_PROVIDER_SITE_OTHER): Payer: BLUE CROSS/BLUE SHIELD | Admitting: Family Medicine

## 2016-01-08 VITALS — BP 144/76 | HR 79 | Wt 265.0 lb

## 2016-01-08 DIAGNOSIS — J324 Chronic pansinusitis: Secondary | ICD-10-CM | POA: Diagnosis not present

## 2016-01-08 DIAGNOSIS — R062 Wheezing: Secondary | ICD-10-CM

## 2016-01-08 MED ORDER — BUDESONIDE-FORMOTEROL FUMARATE 80-4.5 MCG/ACT IN AERO: 2.0000 | INHALATION_SPRAY | Freq: Two times a day (BID) | RESPIRATORY_TRACT | Status: DC

## 2016-01-08 MED ORDER — ALBUTEROL SULFATE HFA 108 (90 BASE) MCG/ACT IN AERS: 2.0000 | INHALATION_SPRAY | Freq: Four times a day (QID) | RESPIRATORY_TRACT | Status: DC | PRN

## 2016-01-08 MED ORDER — CEFDINIR 300 MG PO CAPS: 300.0000 mg | ORAL_CAPSULE | Freq: Two times a day (BID) | ORAL | Status: DC

## 2016-01-08 NOTE — Progress Notes (Signed)
Subjective:    Patient ID: Christian Gregory, male    DOB: 01/29/1971, 45 y.o.   MRN: [id number]  HPI He comes in today for recurrent cough. I last saw him about 7 weeks ago. Because of persistent symptoms we decided to do a limited sinus CT. It showed severe pansinusitis and mucosal edema of the olfactory recesses as well. We decided to put him on Flonase, oral prednisone, and 4 weeks of Levaquin.  He did feel much better on the Levaquin but now started to cough and feel worse again over the last  . No fevers chills or sweats. He says he did complete all the antibiotics and even avoid alcohol during that time so that it did not effect antibiotic.   Review of Systems  BP 144/76 mmHg  Pulse 79  Wt 265 lb (120.203 kg)  SpO2 98%    Allergies  Allergen Reactions  . Penicillins     REACTION: anaphalatic    Past Medical History  Diagnosis Date  . Anxiety     anger problems  . Hypertension   . Obesity   . Fatty liver   . Eosinophilic esophagitis   . History of methicillin resistant staphylococcus aureus (MRSA)     Past Surgical History  Procedure Laterality Date  . Esophageal dilation      Social History   Social History  . Marital Status: Married    Spouse Name: N/A  . Number of Children: N/A  . Years of Education: N/A   Occupational History  . Not on file.   Social History Main Topics  . Smoking status: Never Smoker   . Smokeless tobacco: Not on file  . Alcohol Use: Yes     Comment: 6 pack of beer 2-3 x a week  . Drug Use: No  . Sexual Activity: Yes   Other Topics Concern  . Not on file   Social History Narrative    Family History  Problem Relation Age of Onset  . Hypertension Mother     father  . Stroke Father     Outpatient Encounter Prescriptions as of 01/08/2016  Medication Sig  . ALPRAZolam (XANAX) 0.5 MG tablet Take 1 tablet (0.5 mg total) by mouth at bedtime as needed.  . fluticasone (FLONASE) 50 MCG/ACT nasal spray Place 2 sprays into both  nostrils daily.  Marland Kitchen testosterone cypionate (DEPOTESTOSTERONE CYPIONATE) 200 MG/ML injection INJECT 1 ML (200 MG) INTO THE MUSCLE EVERY 14 DAYS AS DIRECTED.  Marland Kitchen albuterol (PROVENTIL HFA;VENTOLIN HFA) 108 (90 Base) MCG/ACT inhaler Inhale 2 puffs into the lungs every 6 (six) hours as needed for wheezing or shortness of breath.  . budesonide-formoterol (SYMBICORT) 80-4.5 MCG/ACT inhaler Inhale 2 puffs into the lungs 2 (two) times daily.  . cefdinir (OMNICEF) 300 MG capsule Take 1 capsule (300 mg total) by mouth 2 (two) times daily.  . [DISCONTINUED] levofloxacin (LEVAQUIN) 500 MG tablet Take 1 tablet (500 mg total) by mouth daily.  . [DISCONTINUED] predniSONE (DELTASONE) 20 MG tablet Take 2 tablets (40 mg total) by mouth daily.   No facility-administered encounter medications on file as of 01/08/2016.          Objective:   Physical Exam  Constitutional: He is oriented to person, place, and time. He appears well-developed and well-nourished.  HENT:  Head: Normocephalic and atraumatic.  Right Ear: External ear normal.  Left Ear: External ear normal.  Nose: Nose normal.  Mouth/Throat: Oropharynx is clear and moist.  TMs and canals are clear.  Eyes: Conjunctivae and EOM are normal. Pupils are equal, round, and reactive to light.  Neck: Neck supple. No thyromegaly present.  Cardiovascular: Normal rate and normal heart sounds.   Pulmonary/Chest: Effort normal. He has wheezes.  Diffuse wheezing.    Lymphadenopathy:    He has no cervical adenopathy.  Neurological: He is alert and oriented to person, place, and time.  Skin: Skin is warm and dry.  Psychiatric: He has a normal mood and affect. His behavior is normal.          Assessment & Plan:  Acute sinusitis - At this point I really think he needs to see ENT as well as an allergy immunologist. He had 4 weeks of Levaquin to treat a severe pansinusitis as evidenced by CT scan. He felt better for about 2 weeks and then started feeling worse  again with significant sinus congestion and pain and pressure in the face and recurrent cough and just not feeling well. No fevers or chills. He is actually getting ready to leave town next week and will be back the week after safer now to go ahead and put him on Omnicef and prednisone for the short-term.  Wheezing - will start Symbicort twice a day.  He really needs to be formally tested for asthma with spirometry. I was hoping to do that once he was feeling better but since he's acutely ill will go ahead and treat him symptomatically. Also refilled his albuterol inhaler.

## 2016-01-08 NOTE — Patient Instructions (Signed)
Symbicort - 2 puffs inhaled twice a day.

## 2016-01-22 DIAGNOSIS — J4551 Severe persistent asthma with (acute) exacerbation: Secondary | ICD-10-CM | POA: Diagnosis not present

## 2016-01-22 DIAGNOSIS — D849 Immunodeficiency, unspecified: Secondary | ICD-10-CM | POA: Diagnosis not present

## 2016-01-22 DIAGNOSIS — J329 Chronic sinusitis, unspecified: Secondary | ICD-10-CM | POA: Diagnosis not present

## 2016-01-22 DIAGNOSIS — J301 Allergic rhinitis due to pollen: Secondary | ICD-10-CM | POA: Diagnosis not present

## 2016-01-22 DIAGNOSIS — R05 Cough: Secondary | ICD-10-CM | POA: Diagnosis not present

## 2016-01-22 DIAGNOSIS — J3089 Other allergic rhinitis: Secondary | ICD-10-CM | POA: Diagnosis not present

## 2016-01-25 DIAGNOSIS — J4540 Moderate persistent asthma, uncomplicated: Secondary | ICD-10-CM | POA: Diagnosis not present

## 2016-01-25 DIAGNOSIS — R05 Cough: Secondary | ICD-10-CM | POA: Diagnosis not present

## 2016-01-25 DIAGNOSIS — R0600 Dyspnea, unspecified: Secondary | ICD-10-CM | POA: Diagnosis not present

## 2016-02-05 ENCOUNTER — Other Ambulatory Visit: Payer: Self-pay | Admitting: Internal Medicine

## 2016-02-05 DIAGNOSIS — R05 Cough: Secondary | ICD-10-CM

## 2016-02-05 DIAGNOSIS — R059 Cough, unspecified: Secondary | ICD-10-CM

## 2016-02-06 ENCOUNTER — Ambulatory Visit (INDEPENDENT_AMBULATORY_CARE_PROVIDER_SITE_OTHER): Payer: BLUE CROSS/BLUE SHIELD

## 2016-02-06 DIAGNOSIS — R05 Cough: Secondary | ICD-10-CM | POA: Diagnosis not present

## 2016-02-06 DIAGNOSIS — R059 Cough, unspecified: Secondary | ICD-10-CM

## 2016-02-06 DIAGNOSIS — R062 Wheezing: Secondary | ICD-10-CM

## 2016-02-27 DIAGNOSIS — J329 Chronic sinusitis, unspecified: Secondary | ICD-10-CM | POA: Diagnosis not present

## 2016-02-27 DIAGNOSIS — J3089 Other allergic rhinitis: Secondary | ICD-10-CM | POA: Diagnosis not present

## 2016-02-27 DIAGNOSIS — J301 Allergic rhinitis due to pollen: Secondary | ICD-10-CM | POA: Diagnosis not present

## 2016-02-27 DIAGNOSIS — R05 Cough: Secondary | ICD-10-CM | POA: Diagnosis not present

## 2016-02-27 DIAGNOSIS — J4551 Severe persistent asthma with (acute) exacerbation: Secondary | ICD-10-CM | POA: Diagnosis not present

## 2016-03-04 DIAGNOSIS — J3081 Allergic rhinitis due to animal (cat) (dog) hair and dander: Secondary | ICD-10-CM | POA: Diagnosis not present

## 2016-03-04 DIAGNOSIS — J342 Deviated nasal septum: Secondary | ICD-10-CM | POA: Diagnosis not present

## 2016-03-04 DIAGNOSIS — J3089 Other allergic rhinitis: Secondary | ICD-10-CM | POA: Diagnosis not present

## 2016-03-04 DIAGNOSIS — J301 Allergic rhinitis due to pollen: Secondary | ICD-10-CM | POA: Diagnosis not present

## 2016-03-04 DIAGNOSIS — J339 Nasal polyp, unspecified: Secondary | ICD-10-CM | POA: Diagnosis not present

## 2016-03-04 DIAGNOSIS — J324 Chronic pansinusitis: Secondary | ICD-10-CM | POA: Diagnosis not present

## 2016-03-05 ENCOUNTER — Encounter: Payer: Self-pay | Admitting: Family Medicine

## 2016-03-05 ENCOUNTER — Ambulatory Visit (INDEPENDENT_AMBULATORY_CARE_PROVIDER_SITE_OTHER): Payer: BLUE CROSS/BLUE SHIELD | Admitting: Family Medicine

## 2016-03-05 VITALS — BP 138/65 | HR 68 | Wt 262.0 lb

## 2016-03-05 DIAGNOSIS — R635 Abnormal weight gain: Secondary | ICD-10-CM

## 2016-03-05 DIAGNOSIS — Z6841 Body Mass Index (BMI) 40.0 and over, adult: Secondary | ICD-10-CM | POA: Diagnosis not present

## 2016-03-05 DIAGNOSIS — J029 Acute pharyngitis, unspecified: Secondary | ICD-10-CM

## 2016-03-05 DIAGNOSIS — J3089 Other allergic rhinitis: Secondary | ICD-10-CM | POA: Diagnosis not present

## 2016-03-05 DIAGNOSIS — J301 Allergic rhinitis due to pollen: Secondary | ICD-10-CM | POA: Diagnosis not present

## 2016-03-05 DIAGNOSIS — J3081 Allergic rhinitis due to animal (cat) (dog) hair and dander: Secondary | ICD-10-CM | POA: Diagnosis not present

## 2016-03-05 LAB — POCT RAPID STREP A (OFFICE): Rapid Strep A Screen: NEGATIVE

## 2016-03-05 MED ORDER — PHENTERMINE HCL 37.5 MG PO CAPS: 37.5000 mg | ORAL_CAPSULE | ORAL | Status: DC

## 2016-03-05 MED ORDER — PREDNISONE 20 MG PO TABS: 40.0000 mg | ORAL_TABLET | Freq: Every day | ORAL | Status: DC

## 2016-03-05 NOTE — Progress Notes (Signed)
   Subjective:    Patient ID: Christian Gregory, male    DOB: 07/05/1971, 45 y.o.   MRN: [id number]  HPI Patient comes in today complaining of sore throat for 4 days. He's been using salt water rinses and gargles. He did see his allergist recently was diagnosed with nasal polyps. He is currently on treatment with Astelin and Breo and zyrtec. He's also had some chronic bronchitis with it. He is concerned that the Astelin made actually be causing his sore throat.  No sick contacts.   He says it's really painful to swallow. He's had some pressure in his face and his ears. No headache. No fevers chills or sweats. He started to get just a slight cough in his throat. Nothing deeper productive. Next  Abnormal weight gain-he would really like to restart phentermine. He take it previously without any side effects or problems or chest pain. He plans on getting back into the gym regularly. He says he is been going some just not consistently. He says his wife goes every day and wants to really get back on a plan. He has some diets at home that he had followed a few years ago and did really well with but he wanted to restart the medications that it wasn't as difficult to really control his portion sizes. He would like to get down to about 215 pounds.   Review of Systems     Objective:   Physical Exam  Constitutional: He is oriented to person, place, and time. He appears well-developed and well-nourished.  HENT:  Head: Normocephalic and atraumatic.  Right Ear: External ear normal.  Left Ear: External ear normal.  Nose: Nose normal.  Mouth/Throat: Oropharynx is clear and moist. No oropharyngeal exudate.  TMs and canals are clear. Oropharynx with some cobblestoning and tonsils are mildly enlarged. He has a very small back of the throat area  Eyes: Conjunctivae and EOM are normal. Pupils are equal, round, and reactive to light.  Neck: Neck supple. No thyromegaly present.  Cardiovascular: Normal rate and normal  heart sounds.   Pulmonary/Chest: Effort normal and breath sounds normal.  Lymphadenopathy:    He has no cervical adenopathy.  Neurological: He is alert and oriented to person, place, and time.  Skin: Skin is warm and dry.  Psychiatric: He has a normal mood and affect.        Assessment & Plan:  Pharyngitis-viral-gave him reassurance. Because he feels like his symptoms are more severe I did offer him 5 days of prednisone today. Continue with salt water gargles per call if not better in one week.  Abnormal weight gain/obesity/BMI of 41-we'll restart phentermine. New perception given. Follow-up in one month for blood pressure and weight check. Strongly encouraged him to increase his gym routine and get back on a portion control diet. I did encourage him to also start eating a little bit of protein with breakfast.   Diet: starting a diet given to him years ago. Exercise: Has gym membership. Encouraged to go at least 3-4 times per week Medication-phentermine 37.5 mg restart today. Goal weight is 215 pounds.

## 2016-03-06 DIAGNOSIS — J3089 Other allergic rhinitis: Secondary | ICD-10-CM | POA: Diagnosis not present

## 2016-03-06 DIAGNOSIS — J3081 Allergic rhinitis due to animal (cat) (dog) hair and dander: Secondary | ICD-10-CM | POA: Diagnosis not present

## 2016-03-06 DIAGNOSIS — J301 Allergic rhinitis due to pollen: Secondary | ICD-10-CM | POA: Diagnosis not present

## 2016-03-07 DIAGNOSIS — J301 Allergic rhinitis due to pollen: Secondary | ICD-10-CM | POA: Diagnosis not present

## 2016-03-07 DIAGNOSIS — J3081 Allergic rhinitis due to animal (cat) (dog) hair and dander: Secondary | ICD-10-CM | POA: Diagnosis not present

## 2016-03-07 DIAGNOSIS — J3089 Other allergic rhinitis: Secondary | ICD-10-CM | POA: Diagnosis not present

## 2016-03-21 DIAGNOSIS — J328 Other chronic sinusitis: Secondary | ICD-10-CM | POA: Diagnosis not present

## 2016-03-31 DIAGNOSIS — J342 Deviated nasal septum: Secondary | ICD-10-CM | POA: Diagnosis not present

## 2016-03-31 DIAGNOSIS — J339 Nasal polyp, unspecified: Secondary | ICD-10-CM | POA: Diagnosis not present

## 2016-03-31 DIAGNOSIS — J324 Chronic pansinusitis: Secondary | ICD-10-CM | POA: Diagnosis not present

## 2016-03-31 DIAGNOSIS — J329 Chronic sinusitis, unspecified: Secondary | ICD-10-CM | POA: Diagnosis not present

## 2016-04-02 ENCOUNTER — Ambulatory Visit: Payer: BLUE CROSS/BLUE SHIELD | Admitting: Sports Medicine

## 2016-04-09 DIAGNOSIS — J324 Chronic pansinusitis: Secondary | ICD-10-CM | POA: Diagnosis not present

## 2016-04-09 DIAGNOSIS — H6502 Acute serous otitis media, left ear: Secondary | ICD-10-CM | POA: Diagnosis not present

## 2016-04-10 DIAGNOSIS — H6501 Acute serous otitis media, right ear: Secondary | ICD-10-CM | POA: Diagnosis not present

## 2016-04-11 DIAGNOSIS — E6609 Other obesity due to excess calories: Secondary | ICD-10-CM | POA: Diagnosis not present

## 2016-04-11 DIAGNOSIS — R7303 Prediabetes: Secondary | ICD-10-CM | POA: Diagnosis not present

## 2016-04-11 DIAGNOSIS — E8881 Metabolic syndrome: Secondary | ICD-10-CM | POA: Diagnosis not present

## 2016-04-11 DIAGNOSIS — Z6837 Body mass index (BMI) 37.0-37.9, adult: Secondary | ICD-10-CM | POA: Diagnosis not present

## 2016-04-13 ENCOUNTER — Emergency Department (INDEPENDENT_AMBULATORY_CARE_PROVIDER_SITE_OTHER)
Admission: EM | Admit: 2016-04-13 | Discharge: 2016-04-13 | Disposition: A | Discharge status: Home / Self Care | Payer: BLUE CROSS/BLUE SHIELD | Source: Home / Self Care | Attending: Family Medicine | Admitting: Family Medicine

## 2016-04-13 ENCOUNTER — Encounter: Payer: Self-pay | Admitting: Emergency Medicine

## 2016-04-13 DIAGNOSIS — H9213 Otorrhea, bilateral: Secondary | ICD-10-CM

## 2016-04-13 DIAGNOSIS — H9203 Otalgia, bilateral: Secondary | ICD-10-CM | POA: Diagnosis not present

## 2016-04-13 MED ORDER — PREDNISONE 20 MG PO TABS
ORAL_TABLET | ORAL | 0 refills | Status: DC
Start: 2016-04-13 — End: 2016-12-09

## 2016-04-13 NOTE — ED Notes (Signed)
Patient refused to accept referral to new ENT discharge information was given and explained to patient and he states I will be going back to my doctor.

## 2016-04-13 NOTE — ED Triage Notes (Signed)
States bilateral ear discomfort began 3-4 days ago; has sense of fullness and cannot hear well .

## 2016-04-13 NOTE — ED Provider Notes (Signed)
CSN: [id number]     Arrival date & time 04/13/16  1340 History   First MD Initiated Contact with Patient 04/13/16 1411     Chief Complaint  Patient presents with  . Otalgia   (Consider location/radiation/quality/duration/timing/severity/associated sxs/prior Treatment) HPI  Christian Gregory is a 45 y.o. male presenting to UC with c/o bilateral ear pain and pressure for 3-4 days.  Pt reports having sinus surgery 2 weeks ago.  He was advised to start using sinus rinses but was concerned water was getting behind his ears causing pain and pressure.  Pt then had ear tubes placed on Thursday, 8/24, by his ENT.  Pt was advised it would help drain fluid but to continue using the sinus rinses. Pt concerned as fluid "squirted out" both his ears Thursday night as well as blood.  He did not call his ENT the next day as he was trying to give it time to heal over the weekend.  Pt notes ear fullness has worsened.  He is currently on Clindamycin from the sinus surgery. Denies fever, chills, n/v/d.    Past Medical History:  Diagnosis Date  . Anxiety    anger problems  . Eosinophilic esophagitis   . Fatty liver   . History of methicillin resistant staphylococcus aureus (MRSA)   . Hypertension   . Obesity    Past Surgical History:  Procedure Laterality Date  . ESOPHAGEAL DILATION     Family History  Problem Relation Age of Onset  . Hypertension Mother     father  . Stroke Father    Social History  Substance Use Topics  . Smoking status: Never Smoker  . Smokeless tobacco: Never Used  . Alcohol use Yes     Comment: 6 pack of beer 2-3 x a week    Review of Systems  Constitutional: Negative for chills, fatigue and fever.  HENT: Positive for ear discharge ( fluid and blood), ear pain, hearing loss and sinus pressure. Negative for congestion, facial swelling, rhinorrhea and sore throat.   Respiratory: Negative for cough and shortness of breath.   Cardiovascular: Negative for chest pain and  palpitations.  Neurological: Negative for dizziness, light-headedness and headaches.    Allergies  Penicillins  Home Medications   Prior to Admission medications   Medication Sig Start Date End Date Taking? Authorizing Provider  albuterol (PROVENTIL HFA;VENTOLIN HFA) 108 (90 Base) MCG/ACT inhaler Inhale 2 puffs into the lungs every 6 (six) hours as needed for wheezing or shortness of breath. 01/08/16   Agapito Gamesatherine D Metheney, MD  ALPRAZolam Prudy Feeler(XANAX) 0.5 MG tablet Take 1 tablet (0.5 mg total) by mouth at bedtime as needed. 09/27/15   Agapito Gamesatherine D Metheney, MD  azelastine (ASTELIN) 0.1 % nasal spray 2 SPRAYS EACH NOSTRIL TWICE DAILY 02/27/16   Historical Provider, MD  BREO ELLIPTA 200-25 MCG/INH AEPB USE 1 PUFF INHALED DAILY 02/18/16   Historical Provider, MD  cetirizine (ZYRTEC) 10 MG tablet Take 10 mg by mouth daily. 02/17/16   Historical Provider, MD  fluticasone (FLONASE) 50 MCG/ACT nasal spray Place 2 sprays into both nostrils daily. 10/17/15   Agapito Gamesatherine D Metheney, MD  phentermine 37.5 MG capsule Take 1 capsule (37.5 mg total) by mouth every morning. 03/05/16   Agapito Gamesatherine D Metheney, MD  predniSONE (DELTASONE) 20 MG tablet Take 2 tablets (40 mg total) by mouth daily. 03/05/16   Agapito Gamesatherine D Metheney, MD  predniSONE (DELTASONE) 20 MG tablet 3 tabs po day one, then 2 po daily x 4 days 04/13/16  Junius Finner, PA-C  testosterone cypionate (DEPOTESTOSTERONE CYPIONATE) 200 MG/ML injection INJECT 1 ML (200 MG) INTO THE MUSCLE EVERY 14 DAYS AS DIRECTED. 09/27/15   Agapito Games, MD   Meds Ordered and Administered this Visit  Medications - No data to display  BP 136/83 (BP Location: Left Arm)   Pulse 78   Temp 98.9 F (37.2 C) (Oral)   Resp 16   Ht 5\' 8"  (1.727 m)   Wt 242 lb (109.8 kg)   SpO2 99%   BMI 36.80 kg/m  No data found.   Physical Exam  Constitutional: He is oriented to person, place, and time. He appears well-developed and well-nourished.  HENT:  Head: Normocephalic and atraumatic.   Right Ear: There is drainage. No mastoid tenderness. Tympanic membrane is scarred. Tympanic membrane is not erythematous and not bulging.  Left Ear: There is drainage. No mastoid tenderness. Tympanic membrane is scarred. Tympanic membrane is not erythematous and not bulging.  Bilateral ears: clear discharge in both ear canals.  PE tube noted in Right ear drum. No tube visualized in Left ear. Clear fluid in both canals.  No blood noted.   Eyes: EOM are normal.  Neck: Normal range of motion. Neck supple.  Cardiovascular: Normal rate and regular rhythm.   Pulmonary/Chest: Effort normal and breath sounds normal. No respiratory distress. He has no wheezes. He has no rales.  Musculoskeletal: Normal range of motion.  Neurological: He is alert and oriented to person, place, and time.  Skin: Skin is warm and dry.  Psychiatric: He has a normal mood and affect. His behavior is normal.  Nursing note and vitals reviewed.   Urgent Care Course   Clinical Course    Procedures (including critical care time)  Labs Review Labs Reviewed - No data to display  Imaging Review No results found.   MDM   1. Acute ear pain, bilateral   2. Ear drainage, bilateral    Pt c/o bilateral ear fullness and pressure after having ear tubes placed 3 days ago following sinus surgery 2 weeks ago. Pt currently taking Clindamycin and suppose to be using sinus rinse with a steroid but notes that only causes more pain and pressure in his ears and states "water shot out" of both his ears Thursday night after trying the rinse. He has not used the rinse since. He has not called his ENT.  PE tube in good position in Right ear draining clear fluid. No blood. Left TM: no erythema or bleeding. Clear fluid in canal. No PE tube visualized.  Advised pt he would need to call his ENT first thing in morning.  Tube cannot be "replaced" in the urgent care setting and fluid cannot be "sucked out" of his ears in urgent care  setting. Reassured pt he was on the correct antibiotic.   Pt agreeable to try oral prednisone to help with sinus inflammation, which can help with pain and pressure. Pt offered additional resources for other ENTs in area if not satisfied by care provided by his current ENT, pt did not want to accept resource guide, plans to call his ENT in the morning.      Junius Finner, PA-C 04/13/16 1444

## 2016-04-16 DIAGNOSIS — H60393 Other infective otitis externa, bilateral: Secondary | ICD-10-CM | POA: Diagnosis not present

## 2016-04-16 DIAGNOSIS — J324 Chronic pansinusitis: Secondary | ICD-10-CM | POA: Diagnosis not present

## 2016-04-18 DIAGNOSIS — J324 Chronic pansinusitis: Secondary | ICD-10-CM | POA: Diagnosis not present

## 2016-04-18 DIAGNOSIS — H60393 Other infective otitis externa, bilateral: Secondary | ICD-10-CM | POA: Diagnosis not present

## 2016-05-07 DIAGNOSIS — J324 Chronic pansinusitis: Secondary | ICD-10-CM | POA: Diagnosis not present

## 2016-05-28 DIAGNOSIS — R7303 Prediabetes: Secondary | ICD-10-CM | POA: Diagnosis not present

## 2016-05-28 DIAGNOSIS — Z6839 Body mass index (BMI) 39.0-39.9, adult: Secondary | ICD-10-CM | POA: Diagnosis not present

## 2016-05-28 DIAGNOSIS — E8881 Metabolic syndrome: Secondary | ICD-10-CM | POA: Diagnosis not present

## 2016-05-28 DIAGNOSIS — E669 Obesity, unspecified: Secondary | ICD-10-CM | POA: Diagnosis not present

## 2016-06-07 ENCOUNTER — Other Ambulatory Visit: Payer: Self-pay | Admitting: Family Medicine

## 2016-06-18 ENCOUNTER — Other Ambulatory Visit: Payer: Self-pay | Admitting: Family Medicine

## 2016-07-25 DIAGNOSIS — Z79899 Other long term (current) drug therapy: Secondary | ICD-10-CM | POA: Diagnosis not present

## 2016-07-25 DIAGNOSIS — R531 Weakness: Secondary | ICD-10-CM | POA: Diagnosis not present

## 2016-07-25 DIAGNOSIS — R202 Paresthesia of skin: Secondary | ICD-10-CM | POA: Diagnosis not present

## 2016-07-25 DIAGNOSIS — Z825 Family history of asthma and other chronic lower respiratory diseases: Secondary | ICD-10-CM | POA: Diagnosis not present

## 2016-07-25 DIAGNOSIS — I10 Essential (primary) hypertension: Secondary | ICD-10-CM | POA: Diagnosis not present

## 2016-07-25 DIAGNOSIS — Z841 Family history of disorders of kidney and ureter: Secondary | ICD-10-CM | POA: Diagnosis not present

## 2016-07-25 DIAGNOSIS — R4182 Altered mental status, unspecified: Secondary | ICD-10-CM | POA: Diagnosis not present

## 2016-07-25 DIAGNOSIS — Z823 Family history of stroke: Secondary | ICD-10-CM | POA: Diagnosis not present

## 2016-07-25 DIAGNOSIS — Z880 Allergy status to penicillin: Secondary | ICD-10-CM | POA: Diagnosis not present

## 2016-07-25 DIAGNOSIS — R7303 Prediabetes: Secondary | ICD-10-CM | POA: Diagnosis not present

## 2016-07-25 DIAGNOSIS — Z8261 Family history of arthritis: Secondary | ICD-10-CM | POA: Diagnosis not present

## 2016-07-25 DIAGNOSIS — R030 Elevated blood-pressure reading, without diagnosis of hypertension: Secondary | ICD-10-CM | POA: Diagnosis not present

## 2016-07-25 DIAGNOSIS — Z8249 Family history of ischemic heart disease and other diseases of the circulatory system: Secondary | ICD-10-CM | POA: Diagnosis not present

## 2016-07-25 DIAGNOSIS — H538 Other visual disturbances: Secondary | ICD-10-CM | POA: Diagnosis not present

## 2016-07-25 DIAGNOSIS — J324 Chronic pansinusitis: Secondary | ICD-10-CM | POA: Diagnosis not present

## 2016-07-25 DIAGNOSIS — G459 Transient cerebral ischemic attack, unspecified: Secondary | ICD-10-CM | POA: Diagnosis not present

## 2016-07-26 DIAGNOSIS — G459 Transient cerebral ischemic attack, unspecified: Secondary | ICD-10-CM | POA: Diagnosis not present

## 2016-07-26 DIAGNOSIS — R7303 Prediabetes: Secondary | ICD-10-CM | POA: Diagnosis not present

## 2016-07-26 DIAGNOSIS — I519 Heart disease, unspecified: Secondary | ICD-10-CM | POA: Diagnosis not present

## 2016-07-26 DIAGNOSIS — I517 Cardiomegaly: Secondary | ICD-10-CM | POA: Diagnosis not present

## 2016-07-26 DIAGNOSIS — R030 Elevated blood-pressure reading, without diagnosis of hypertension: Secondary | ICD-10-CM | POA: Diagnosis not present

## 2016-08-21 ENCOUNTER — Ambulatory Visit: Payer: BLUE CROSS/BLUE SHIELD | Admitting: Family Medicine

## 2016-08-22 DIAGNOSIS — J324 Chronic pansinusitis: Secondary | ICD-10-CM | POA: Diagnosis not present

## 2016-09-04 ENCOUNTER — Other Ambulatory Visit: Payer: Self-pay | Admitting: Family Medicine

## 2016-10-01 DIAGNOSIS — J324 Chronic pansinusitis: Secondary | ICD-10-CM | POA: Diagnosis not present

## 2016-10-01 DIAGNOSIS — J3089 Other allergic rhinitis: Secondary | ICD-10-CM | POA: Diagnosis not present

## 2016-10-01 DIAGNOSIS — J339 Nasal polyp, unspecified: Secondary | ICD-10-CM | POA: Diagnosis not present

## 2016-12-09 ENCOUNTER — Encounter: Payer: Self-pay | Admitting: Family Medicine

## 2016-12-09 ENCOUNTER — Ambulatory Visit (INDEPENDENT_AMBULATORY_CARE_PROVIDER_SITE_OTHER): Payer: BLUE CROSS/BLUE SHIELD | Admitting: Family Medicine

## 2016-12-09 VITALS — BP 136/84 | HR 76 | Ht 68.0 in | Wt 266.0 lb

## 2016-12-09 DIAGNOSIS — M545 Low back pain, unspecified: Secondary | ICD-10-CM

## 2016-12-09 DIAGNOSIS — B353 Tinea pedis: Secondary | ICD-10-CM | POA: Diagnosis not present

## 2016-12-09 MED ORDER — ALPRAZOLAM 0.5 MG PO TABS: 0.5000 mg | ORAL_TABLET | Freq: Every evening | ORAL | 2 refills | Status: DC | PRN

## 2016-12-09 MED ORDER — CYCLOBENZAPRINE HCL 10 MG PO TABS: 10.0000 mg | ORAL_TABLET | Freq: Every evening | ORAL | 0 refills | Status: DC | PRN

## 2016-12-09 MED ORDER — PREDNISONE 20 MG PO TABS: 40.0000 mg | ORAL_TABLET | Freq: Every day | ORAL | 0 refills | Status: DC

## 2016-12-09 MED ORDER — TERBINAFINE HCL 1 % EX CREA: 1.0000 "application " | TOPICAL_CREAM | Freq: Two times a day (BID) | CUTANEOUS | 0 refills | Status: DC

## 2016-12-09 NOTE — Patient Instructions (Addendum)
Athlete's Foot Athlete's foot (tinea pedis) is a fungal infection of the skin on the feet. It often occurs on the skin that is between or underneath the toes. It can also occur on the soles of the feet. The infection can spread from person to person (is contagious). What are the causes? Athlete's foot is caused by a fungus. This fungus grows in warm, moist places. Most people get athlete's foot by sharing shower stalls, towels, and wet floors with someone who is infected. Not washing your feet or changing your socks often enough can contribute to athlete's foot. What increases the risk? This condition is more likely to develop in:  Men.  People who have a weak body defense system (immune system).  People who have diabetes.  People who use public showers, such as at a gym.  People who wear heavy-duty shoes, such as Environmental manager.  Seasons with warm, humid weather. What are the signs or symptoms? Symptoms of this condition include:  Itchy areas between the toes or on the soles of the feet.  White, flaky, or scaly areas between the toes or on the soles of the feet.  Very itchy small blisters between the toes or on the soles of the feet.  Small cuts on the skin. These cuts can become infected.  Thick or discolored toenails. How is this diagnosed? This condition is diagnosed with a medical history and physical exam. Your health care provider may also take a skin or toenail sample to be examined. How is this treated? Treatment for this condition includes antifungal medicines. These may be applied as powders, ointments, or creams. In severe cases, an oral antifungal medicine may be given. Follow these instructions at home:  Apply or take over-the-counter and prescription medicines only as told by your health care provider.  Keep all follow-up visits as told by your health care provider. This is important.  Do not scratch your feet.  Keep your feet dry:  Wear cotton  or wool socks. Change your socks every day or if they become wet.  Wear shoes that allow air to circulate, such as sandals or canvas tennis shoes.  Wash and dry your feet:  Every day or as told by your health care provider.  After exercising.  Including the area between your toes.  Do not share towels, nail clippers, or other personal items that touch your feet with others.  If you have diabetes, keep your blood sugar under control. How is this prevented?  Do not share towels.  Wear sandals in wet areas, such as locker rooms and shared showers.  Keep your feet dry:  Wear cotton or wool socks. Change your socks every day or if they become wet.  Wear shoes that allow air to circulate, such as sandals or canvas tennis shoes.  Wash and dry your feet after exercising. Pay attention to the area between your toes. Contact a health care provider if:  You have a fever.  You have swelling, soreness, warmth, or redness in your foot.  You are not getting better with treatment.  Your symptoms get worse.  You have new symptoms. This information is not intended to replace advice given to you by your health care provider. Make sure you discuss any questions you have with your health care provider. Document Released: 08/01/2000 Document Revised: 01/10/2016 Document Reviewed: 02/05/2015 Elsevier Interactive Patient Education  2017 [place] Injury Prevention Back injuries can be very painful. They can also be difficult to  heal. After having one back injury, you are more likely to injure your back again. It is important to learn how to avoid injuring or re-injuring your back. The following tips can help you to prevent a back injury. What should I know about physical fitness?  Exercise for 30 minutes per day on most days of the week or as directed by your health care provider. Make sure to:  Do aerobic exercises, such as walking, jogging, biking, or swimming.  Do exercises  that increase balance and strength, such as tai chi and yoga. These can decrease your risk of falling and injuring your back.  Do stretching exercises to help with flexibility.  Try to develop strong abdominal muscles. Your abdominal muscles provide a lot of the support that is needed by your back.  Maintain a healthy weight. This helps to decrease your risk of a back injury. What should I know about my diet?  Talk with your health care provider about your overall diet. Take supplements and vitamins only as directed by your health care provider.  Talk with your health care provider about how much calcium and vitamin D you need each day. These nutrients help to prevent weakening of the bones (osteoporosis). Osteoporosis can cause broken (fractured) bones, which lead to back pain.  Include good sources of calcium in your diet, such as dairy products, green leafy vegetables, and products that have had calcium added to them (fortified).  Include good sources of vitamin D in your diet, such as milk and foods that are fortified with vitamin D. What should I know about my posture?  Sit up straight and stand up straight. Avoid leaning forward when you sit or hunching over when you stand.  Choose chairs that have good low-back (lumbar) support.  If you work at a desk, sit close to it so you do not need to lean over. Keep your chin tucked in. Keep your neck drawn back, and keep your elbows bent at a right angle. Your arms should look like the letter "L."  Sit high and close to the steering wheel when you drive. Add a lumbar support to your car seat, if needed.  Avoid sitting or standing in one position for very long. Take breaks to get up, stretch, and walk around at least one time every hour. Take breaks every hour if you are driving for long periods of time.  Sleep on your side with your knees slightly bent, or sleep on your back with a pillow under your knees. Do not lie on the front of your  body to sleep. What should I know about lifting, twisting, and reaching? Lifting and Heavy Lifting    Avoid heavy lifting, especially repetitive heavy lifting. If you must do heavy lifting:  Stretch before lifting.  Work slowly.  Rest between lifts.  Use a tool such as a cart or a dolly to move objects if one is available.  Make several small trips instead of carrying one heavy load.  Ask for help when you need it, especially when moving big objects.  Follow these steps when lifting:  Stand with your feet shoulder-width apart.  Get as close to the object as you can. Do not try to pick up a heavy object that is far from your body.  Use handles or lifting straps if they are available.  Bend at your knees. Squat down, but keep your heels off the floor.  Keep your shoulders pulled back, your chin tucked in, and your  back straight.  Lift the object slowly while you tighten the muscles in your legs, abdomen, and buttocks. Keep the object as close to the center of your body as possible.  Follow these steps when putting down a heavy load:  Stand with your feet shoulder-width apart.  Lower the object slowly while you tighten the muscles in your legs, abdomen, and buttocks. Keep the object as close to the center of your body as possible.  Keep your shoulders pulled back, your chin tucked in, and your back straight.  Bend at your knees. Squat down, but keep your heels off the floor.  Use handles or lifting straps if they are available. Twisting and Reaching   Avoid lifting heavy objects above your waist.  Do not twist at your waist while you are lifting or carrying a load. If you need to turn, move your feet.  Do not bend over without bending at your knees.  Avoid reaching over your head, across a table, or for an object on a high surface. What are some other tips?  Avoid wet floors and icy ground. Keep sidewalks clear of ice to prevent falls.  Do not sleep on a mattress  that is too soft or too hard.  Keep items that are used frequently within easy reach.  Put heavier objects on shelves at waist level, and put lighter objects on lower or higher shelves.  Find ways to decrease your stress, such as exercise, massage, or relaxation techniques. Stress can build up in your muscles. Tense muscles are more vulnerable to injury.  Talk with your health care provider if you feel anxious or depressed. These conditions can make back pain worse.  Wear flat heel shoes with cushioned soles.  Avoid sudden movements.  Use both shoulder straps when carrying a backpack.  Do not use any tobacco products, including cigarettes, chewing tobacco, or electronic cigarettes. If you need help quitting, ask your health care provider. This information is not intended to replace advice given to you by your health care provider. Make sure you discuss any questions you have with your health care provider. Document Released: 09/11/2004 Document Revised: 01/10/2016 Document Reviewed: 08/08/2014 Elsevier Interactive Patient Education  2017 [employer].

## 2016-12-09 NOTE — Progress Notes (Signed)
Subjective:    CC:   HPI:  Patient comes in with low back pain that has been persistent for 2 weeks. He said he started to get some discomfort about 3 months ago and felt like it was a sciatic nerve again and got a massage and it really helped. Then about 2 weeks ago it started to bother him again. Reticulated affecting the right low back area. This time he got another massage but this time it did not really help. He's been taking anti-inflammatories and using heat. He then missed a couple days of work last week because he had difficulty even getting in and out of the shower. He says some little bit better this week.  He also has athlete's foot on both feet but worse on the right. Mostly affecting the area between the toes. He's been using Lotrimin ultra cream for about 6 months and it really just has not completely resolved.  Past medical history, Surgical history, Family history not pertinant except as noted below, Social history, Allergies, and medications have been entered into the medical record, reviewed, and corrections made.   Review of Systems: No fevers, chills, night sweats, weight loss, chest pain, or shortness of breath.   Objective:    General: Well Developed, well nourished, and in no acute distress.  Neuro: Alert and oriented x3, extra-ocular muscles intact, sensation grossly intact.  HEENT: Normocephalic, atraumatic  Skin: Warm and dry, He does have some white macerated skin between the toes with some cracking. He has some dry scaly skin on the bottom of his feet. MSK: Normal lumbar flexion, extension, rotation right and left. Normal side bending right and left. That he had pain on the right side when he presented to the left. Nontender over the number spine or SI joints. He had some discomfort in his right low back when I raised his left leg for straight leg raise. No radicular symptoms though. Hip, knee, ankle strength is 5 out of 5 bilaterally. Patellar reflexes 1+  bilaterally.   Impression and Recommendations:   Acute low back pain without radiculopathy-we'll treat with prednisone, muscle relaxer. Also recommend working on stretching. Handout provided. Also recommended the McKenzie exercises.  Athlete's foot-

## 2017-01-05 ENCOUNTER — Encounter: Payer: Self-pay | Admitting: Family Medicine

## 2017-01-05 ENCOUNTER — Ambulatory Visit (INDEPENDENT_AMBULATORY_CARE_PROVIDER_SITE_OTHER): Payer: BLUE CROSS/BLUE SHIELD | Admitting: Family Medicine

## 2017-01-05 VITALS — BP 152/88 | HR 83 | Ht 68.0 in | Wt 268.0 lb

## 2017-01-05 DIAGNOSIS — J0141 Acute recurrent pansinusitis: Secondary | ICD-10-CM | POA: Diagnosis not present

## 2017-01-05 DIAGNOSIS — J329 Chronic sinusitis, unspecified: Secondary | ICD-10-CM | POA: Diagnosis not present

## 2017-01-05 DIAGNOSIS — R768 Other specified abnormal immunological findings in serum: Secondary | ICD-10-CM | POA: Diagnosis not present

## 2017-01-05 LAB — CBC WITH DIFFERENTIAL/PLATELET
BASOS ABS: 82 {cells}/uL (ref 0–200)
Basophils Relative: 1 %
Eosinophils Absolute: 656 cells/uL — ABNORMAL HIGH (ref 15–500)
Eosinophils Relative: 8 %
HCT: 45.9 % (ref 38.5–50.0)
HEMOGLOBIN: 15.6 g/dL (ref 13.2–17.1)
LYMPHS PCT: 29 %
Lymphs Abs: 2378 cells/uL (ref 850–3900)
MCH: 31.7 pg (ref 27.0–33.0)
MCHC: 34 g/dL (ref 32.0–36.0)
MCV: 93.3 fL (ref 80.0–100.0)
MONOS PCT: 11 %
MPV: 9.8 fL (ref 7.5–12.5)
Monocytes Absolute: 902 cells/uL (ref 200–950)
NEUTROS PCT: 51 %
Neutro Abs: 4182 cells/uL (ref 1500–7800)
PLATELETS: 285 10*3/uL (ref 140–400)
RBC: 4.92 MIL/uL (ref 4.20–5.80)
RDW: 12.9 % (ref 11.0–15.0)
WBC: 8.2 10*3/uL (ref 3.8–10.8)

## 2017-01-05 MED ORDER — LEVOFLOXACIN 500 MG PO TABS: 500.0000 mg | ORAL_TABLET | Freq: Every day | ORAL | 0 refills | Status: AC

## 2017-01-05 MED ORDER — PREDNISONE 20 MG PO TABS: 40.0000 mg | ORAL_TABLET | Freq: Every day | ORAL | 0 refills | Status: DC

## 2017-01-05 NOTE — Progress Notes (Addendum)
Subjective:    Patient ID: Christian Gregory, male    DOB: 06-12-1971, 46 y.o.   MRN: [id number]  HPI 24 are old male comes in today complaining of one month of cough, wheezing some facial pressure and pain and some discomfort in his chest. He was diagnosed with chronic pansinusitis about a year ago.  He did have a CT of the chest performed in June 2017. 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 Otherwise normal. Unfortunately he has continued to struggle with recurrent infections. He actually had significant sinus surgery performed in August 2017. He says it has helped some. His last round of prednisone and antibiotics was about 6 weeks ago. He's just frustrated because helpful better for a few weeks and then he starts to feel worse again. They now want him to repeat his allergy testing and to start immunotherapy options.   Review of Systems  BP (!) 152/88   Pulse 83   Ht 5\' 8"  (1.727 m)   Wt 268 lb (121.6 kg)   SpO2 98%   BMI 40.75 kg/m     Allergies  Allergen Reactions  . Penicillins     REACTION: anaphalatic    Past Medical History:  Diagnosis Date  . Anxiety    anger problems  . Eosinophilic esophagitis   . Fatty liver   . History of methicillin resistant staphylococcus aureus (MRSA)   . Hypertension   . Obesity     Past Surgical History:  Procedure Laterality Date  . ESOPHAGEAL DILATION      Social History   Social History  . Marital status: Married    Spouse name: N/A  . Number of children: N/A  . Years of education: N/A   Occupational History  . Not on file.   Social History Main Topics  . Smoking status: Never Smoker  . Smokeless tobacco: Never Used  . Alcohol use Yes     Comment: 6 pack of beer 2-3 x a week  . Drug use: No  . Sexual activity: Yes   Other Topics Concern  . Not on file   Social History Narrative  . No narrative on file    Family History  Problem Relation Age of Onset  .  Hypertension Mother        father  . Stroke Father     Outpatient Encounter Prescriptions as of 01/05/2017  Medication Sig  . ALPRAZolam (XANAX) 0.5 MG tablet Take 1 tablet (0.5 mg total) by mouth at bedtime as needed.  . cetirizine (ZYRTEC) 10 MG tablet Take 10 mg by mouth daily.  . [facility] ASPIRIN CHILD 81 MG chewable tablet CHEW ONE TABLET (81 MG TOTAL) BY MOUTH DAILY.  . cyclobenzaprine (FLEXERIL) 10 MG tablet Take 1 tablet (10 mg total) by mouth at bedtime as needed for muscle spasms.  . fluticasone furoate-vilanterol (BREO ELLIPTA) 100-25 MCG/INH AEPB Inhale 1 puff into the lungs daily.  . montelukast (SINGULAIR) 10 MG tablet Take 10 mg by mouth daily.  Marland Kitchen PROAIR HFA 108 (90 Base) MCG/ACT inhaler INHALE 2 PUFFS INTO THE LUNGS EVERY 6 (SIX) HOURS AS NEEDED FOR WHEEZING OR SHORTNESS OF BREATH.  . terbinafine (LAMISIL) 1 % cream Apply 1 application topically 2 (two) times daily. X 3 weeks  . testosterone cypionate (DEPOTESTOSTERONE CYPIONATE) 200 MG/ML injection INJECT 1 ML (200 MG) INTO THE MUSCLE EVERY 14 DAYS AS DIRECTED.  Marland Kitchen levofloxacin (LEVAQUIN) 500 MG tablet Take 1 tablet (500 mg  total) by mouth daily.  . predniSONE (DELTASONE) 20 MG tablet Take 2 tablets (40 mg total) by mouth daily.  . [DISCONTINUED] predniSONE (DELTASONE) 20 MG tablet Take 2 tablets (40 mg total) by mouth daily.   No facility-administered encounter medications on file as of 01/05/2017.           Objective:   Physical Exam  Constitutional: He is oriented to person, place, and time. He appears well-developed and well-nourished.  HENT:  Head: Normocephalic and atraumatic.  Right Ear: External ear normal.  Left Ear: External ear normal.  Nose: Nose normal.  Mouth/Throat: Oropharynx is clear and moist.  He has some prominence over the nasal bridge.  TMs and canals are clear.   Eyes: Conjunctivae and EOM are normal. Pupils are equal, round, and reactive to light.  Neck: Neck supple. No thyromegaly present.   Cardiovascular: Normal rate and normal heart sounds.   Pulmonary/Chest: Effort normal. He has wheezes.  Diffuse expiratory wheezing with rhonchi.  Lymphadenopathy:    He has no cervical adenopathy.  Neurological: He is alert and oriented to person, place, and time.  Skin: Skin is warm and dry.  Psychiatric: He has a normal mood and affect.       Assessment & Plan:  Acute, recurrent sinusitis-we'll treat with prednisone and Levaquin. He is actually getting ready to leave town next week for a cruise. He also has significant wheezing on exam. I would encourage him to continue further evaluation with repeat allergy testing and immunotherapy treatment. I also concerned about the possibility of an autoimmune disorder or possibly vasculitis. We'll do some additional blood work today.

## 2017-01-06 LAB — ANCA SCREEN W REFLEX TITER: ANCA Screen: POSITIVE — AB

## 2017-01-06 LAB — COMPLETE METABOLIC PANEL WITH GFR
ALBUMIN: 4.4 g/dL (ref 3.6–5.1)
ALT: 40 U/L (ref 9–46)
AST: 22 U/L (ref 10–40)
Alkaline Phosphatase: 58 U/L (ref 40–115)
BILIRUBIN TOTAL: 0.7 mg/dL (ref 0.2–1.2)
BUN: 13 mg/dL (ref 7–25)
CO2: 24 mmol/L (ref 20–31)
CREATININE: 0.82 mg/dL (ref 0.60–1.35)
Calcium: 9.1 mg/dL (ref 8.6–10.3)
Chloride: 104 mmol/L (ref 98–110)
GFR, Est African American: >89 mL/min (ref >=60)
GFR, Est Non African American: >89 mL/min (ref >=60)
GLUCOSE: 139 mg/dL — AB (ref 65–99)
Potassium: 4 mmol/L (ref 3.5–5.3)
SODIUM: 138 mmol/L (ref 135–146)
TOTAL PROTEIN: 7 g/dL (ref 6.1–8.1)

## 2017-01-06 LAB — SEDIMENTATION RATE: Sed Rate: 7 mm/hr (ref 0–15)

## 2017-01-06 LAB — C-REACTIVE PROTEIN: CRP: 5.1 mg/L (ref <8.0)

## 2017-01-06 LAB — RFLX P-ANCA TITER

## 2017-01-06 NOTE — Addendum Note (Signed)
Addended by: Deno EtienneBARKLEY, Kasey Ewings L on: 01/06/2017 05:46 PM   Modules accepted: Orders

## 2017-01-08 NOTE — Addendum Note (Signed)
Addended by: Nani GasserMETHENEY, CATHERINE D on: 01/08/2017 07:38 AM   Modules accepted: Orders

## 2017-01-15 ENCOUNTER — Other Ambulatory Visit: Payer: Self-pay | Admitting: Family Medicine

## 2017-01-16 ENCOUNTER — Other Ambulatory Visit: Payer: Self-pay

## 2017-01-16 MED ORDER — TESTOSTERONE CYPIONATE 200 MG/ML IM SOLN: 200.0000 mg | INTRAMUSCULAR | 1 refills | Status: DC

## 2017-01-26 DIAGNOSIS — Z79899 Other long term (current) drug therapy: Secondary | ICD-10-CM | POA: Diagnosis not present

## 2017-01-26 DIAGNOSIS — I776 Arteritis, unspecified: Secondary | ICD-10-CM | POA: Diagnosis not present

## 2017-01-26 DIAGNOSIS — I7789 Other specified disorders of arteries and arterioles: Secondary | ICD-10-CM | POA: Insufficient documentation

## 2017-01-27 DIAGNOSIS — Z79899 Other long term (current) drug therapy: Secondary | ICD-10-CM | POA: Diagnosis not present

## 2017-02-09 DIAGNOSIS — Z79899 Other long term (current) drug therapy: Secondary | ICD-10-CM | POA: Diagnosis not present

## 2017-02-09 DIAGNOSIS — I776 Arteritis, unspecified: Secondary | ICD-10-CM | POA: Diagnosis not present

## 2017-03-09 DIAGNOSIS — Z79899 Other long term (current) drug therapy: Secondary | ICD-10-CM | POA: Diagnosis not present

## 2017-03-09 DIAGNOSIS — I776 Arteritis, unspecified: Secondary | ICD-10-CM | POA: Diagnosis not present

## 2017-04-07 DIAGNOSIS — R062 Wheezing: Secondary | ICD-10-CM | POA: Diagnosis not present

## 2017-04-07 DIAGNOSIS — Z79899 Other long term (current) drug therapy: Secondary | ICD-10-CM | POA: Diagnosis not present

## 2017-04-07 DIAGNOSIS — I776 Arteritis, unspecified: Secondary | ICD-10-CM | POA: Diagnosis not present

## 2017-04-14 ENCOUNTER — Other Ambulatory Visit: Payer: Self-pay | Admitting: Family Medicine

## 2017-04-14 DIAGNOSIS — D7218 Eosinophilia in diseases classified elsewhere: Secondary | ICD-10-CM

## 2017-04-14 DIAGNOSIS — I7789 Other specified disorders of arteries and arterioles: Secondary | ICD-10-CM

## 2017-04-14 DIAGNOSIS — I776 Arteritis, unspecified: Principal | ICD-10-CM

## 2017-04-14 NOTE — Progress Notes (Signed)
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

## 2017-05-11 DIAGNOSIS — Z79899 Other long term (current) drug therapy: Secondary | ICD-10-CM | POA: Diagnosis not present

## 2017-05-11 DIAGNOSIS — I776 Arteritis, unspecified: Secondary | ICD-10-CM | POA: Diagnosis not present

## 2017-06-30 ENCOUNTER — Encounter: Payer: Self-pay | Admitting: Family Medicine

## 2017-06-30 DIAGNOSIS — J4540 Moderate persistent asthma, uncomplicated: Secondary | ICD-10-CM | POA: Diagnosis not present

## 2017-06-30 DIAGNOSIS — M301 Polyarteritis with lung involvement [Churg-Strauss]: Secondary | ICD-10-CM | POA: Diagnosis not present

## 2017-06-30 DIAGNOSIS — Z79899 Other long term (current) drug therapy: Secondary | ICD-10-CM | POA: Diagnosis not present

## 2017-06-30 DIAGNOSIS — J329 Chronic sinusitis, unspecified: Secondary | ICD-10-CM | POA: Diagnosis not present

## 2017-07-07 DIAGNOSIS — K200 Eosinophilic esophagitis: Secondary | ICD-10-CM | POA: Diagnosis not present

## 2017-07-07 DIAGNOSIS — J4540 Moderate persistent asthma, uncomplicated: Secondary | ICD-10-CM | POA: Diagnosis not present

## 2017-07-07 DIAGNOSIS — Z23 Encounter for immunization: Secondary | ICD-10-CM | POA: Diagnosis not present

## 2017-07-07 DIAGNOSIS — M301 Polyarteritis with lung involvement [Churg-Strauss]: Secondary | ICD-10-CM | POA: Diagnosis not present

## 2017-07-07 DIAGNOSIS — Z09 Encounter for follow-up examination after completed treatment for conditions other than malignant neoplasm: Secondary | ICD-10-CM | POA: Diagnosis not present

## 2017-07-07 DIAGNOSIS — J329 Chronic sinusitis, unspecified: Secondary | ICD-10-CM | POA: Diagnosis not present

## 2017-07-07 DIAGNOSIS — J4550 Severe persistent asthma, uncomplicated: Secondary | ICD-10-CM | POA: Diagnosis not present

## 2017-07-07 DIAGNOSIS — Z8709 Personal history of other diseases of the respiratory system: Secondary | ICD-10-CM | POA: Diagnosis not present

## 2017-07-07 DIAGNOSIS — Z79899 Other long term (current) drug therapy: Secondary | ICD-10-CM | POA: Diagnosis not present

## 2017-07-07 DIAGNOSIS — R0602 Shortness of breath: Secondary | ICD-10-CM | POA: Diagnosis not present

## 2017-08-20 ENCOUNTER — Telehealth: Payer: BLUE CROSS/BLUE SHIELD | Admitting: Family

## 2017-08-20 DIAGNOSIS — J029 Acute pharyngitis, unspecified: Secondary | ICD-10-CM

## 2017-08-20 MED ORDER — PREDNISONE 5 MG PO TABS: 5.0000 mg | ORAL_TABLET | ORAL | 0 refills | Status: DC

## 2017-08-20 MED ORDER — BENZONATATE 100 MG PO CAPS: 100.0000 mg | ORAL_CAPSULE | Freq: Three times a day (TID) | ORAL | 0 refills | Status: DC | PRN

## 2017-08-20 NOTE — Progress Notes (Signed)
Thank you for the details you included in the comment boxes. Those details are very helpful in determining the best course of treatment for you and help us to provide the best care.  We are sorry that you are not feeling well.  Here is how we plan to help!  Based on your presentation I believe you most likely have A cough due to a virus.  This is called viral bronchitis and is best treated by rest, plenty of fluids and control of the cough.  You may use Ibuprofen or Tylenol as directed to help your symptoms.     In addition you may use A non-prescription cough medication called Mucinex DM: take 2 tablets every 12 hours. and A prescription cough medication called Tessalon Perles 100mg. You may take 1-2 capsules every 8 hours as needed for your cough.  Sterapred 5 mg dosepak  From your responses in the eVisit questionnaire you describe inflammation in the upper respiratory tract which is causing a significant cough.  This is commonly called Bronchitis and has four common causes:    Allergies  Viral Infections  Acid Reflux  Bacterial Infection Allergies, viruses and acid reflux are treated by controlling symptoms or eliminating the cause. An example might be a cough caused by taking certain blood pressure medications. You stop the cough by changing the medication. Another example might be a cough caused by acid reflux. Controlling the reflux helps control the cough.  USE OF BRONCHODILATOR ("RESCUE") INHALERS: There is a risk from using your bronchodilator too frequently.  The risk is that over-reliance on a medication which only relaxes the muscles surrounding the breathing tubes can reduce the effectiveness of medications prescribed to reduce swelling and congestion of the tubes themselves.  Although you feel brief relief from the bronchodilator inhaler, your asthma may actually be worsening with the tubes becoming more swollen and filled with mucus.  This can delay other crucial treatments, such  as oral steroid medications. If you need to use a bronchodilator inhaler daily, several times per day, you should discuss this with your provider.  There are probably better treatments that could be used to keep your asthma under control.     HOME CARE . Only take medications as instructed by your medical team. . Complete the entire course of an antibiotic. . Drink plenty of fluids and get plenty of rest. . Avoid close contacts especially the very young and the elderly . Cover your mouth if you cough or cough into your sleeve. . Always remember to wash your hands . A steam or ultrasonic humidifier can help congestion.   GET HELP RIGHT AWAY IF: . You develop worsening fever. . You become short of breath . You cough up blood. . Your symptoms persist after you have completed your treatment plan MAKE SURE YOU   Understand these instructions.  Will watch your condition.  Will get help right away if you are not doing well or get worse.  Your e-visit answers were reviewed by a board certified advanced clinical practitioner to complete your personal care plan.  Depending on the condition, your plan could have included both over the counter or prescription medications. If there is a problem please reply  once you have received a response from your provider. Your safety is important to us.  If you have drug allergies check your prescription carefully.    You can use MyChart to ask questions about today's visit, request a non-urgent call back, or ask for a work   or school excuse for 24 hours related to this e-Visit. If it has been greater than 24 hours you will need to follow up with your provider, or enter a new e-Visit to address those concerns. You will get an e-mail in the next two days asking about your experience.  I hope that your e-visit has been valuable and will speed your recovery. Thank you for using e-visits.   

## 2017-09-01 DIAGNOSIS — Z8709 Personal history of other diseases of the respiratory system: Secondary | ICD-10-CM | POA: Diagnosis not present

## 2017-09-01 DIAGNOSIS — J4550 Severe persistent asthma, uncomplicated: Secondary | ICD-10-CM | POA: Diagnosis not present

## 2017-09-20 ENCOUNTER — Other Ambulatory Visit: Payer: Self-pay | Admitting: Family Medicine

## 2017-09-21 NOTE — Telephone Encounter (Signed)
Refill for testosterone. Will send to Victorino DikeJennifer so she can call him to schedule a follow up.

## 2017-09-22 NOTE — Telephone Encounter (Signed)
Needs blood work before we can improve this.  He needs a CBC, PSA, and testosterone level.  He has not had this done in well over a year so we cannot refill this medication until then.

## 2017-09-25 NOTE — Telephone Encounter (Signed)
Left message advising of recommendations.  

## 2017-10-29 ENCOUNTER — Telehealth: Payer: Self-pay | Admitting: Family Medicine

## 2017-10-29 NOTE — Telephone Encounter (Signed)
I called and left a VM for patient to schedule F/u appt with Dr. Linford ArnoldMetheney on Testosterone, Fasting Glucose and BP

## 2018-01-05 ENCOUNTER — Ambulatory Visit (INDEPENDENT_AMBULATORY_CARE_PROVIDER_SITE_OTHER): Payer: BLUE CROSS/BLUE SHIELD | Admitting: Family Medicine

## 2018-01-05 ENCOUNTER — Encounter: Payer: Self-pay | Admitting: Family Medicine

## 2018-01-05 VITALS — BP 139/81 | HR 65 | Ht 68.0 in | Wt 247.0 lb

## 2018-01-05 DIAGNOSIS — I10 Essential (primary) hypertension: Secondary | ICD-10-CM | POA: Diagnosis not present

## 2018-01-05 DIAGNOSIS — D7218 Eosinophilia in diseases classified elsewhere: Secondary | ICD-10-CM

## 2018-01-05 DIAGNOSIS — J4551 Severe persistent asthma with (acute) exacerbation: Secondary | ICD-10-CM

## 2018-01-05 DIAGNOSIS — I776 Arteritis, unspecified: Secondary | ICD-10-CM

## 2018-01-05 DIAGNOSIS — I7789 Other specified disorders of arteries and arterioles: Secondary | ICD-10-CM

## 2018-01-05 MED ORDER — FLUTICASONE-SALMETEROL 250-50 MCG/DOSE IN AEPB: 1.0000 | INHALATION_SPRAY | Freq: Two times a day (BID) | RESPIRATORY_TRACT | 5 refills | Status: DC

## 2018-01-05 MED ORDER — ALBUTEROL SULFATE HFA 108 (90 BASE) MCG/ACT IN AERS: 2.0000 | INHALATION_SPRAY | Freq: Four times a day (QID) | RESPIRATORY_TRACT | 11 refills | Status: DC | PRN

## 2018-01-05 NOTE — Progress Notes (Signed)
Subjective:    Patient ID: Christian Gregory, male    DOB: 1970/10/22, 47 y.o.   MRN: [id number]  HPI 47 year old male is here today to follow-up for chronic sinus symptoms.  He was diagnosed with a acinic vasculitis and was followed by Dr. Allena Katz, rheumatology here locally at [facility].  There are no significant improvement on treatment with methotrexate he was referred to [facility] and saw Dr.  Tedd Sias the pulmonologist there.  They then referred him to their pulmonologist to diagnosed him with severe persistent asthma.  He was started on Spiriva, Dulera and Singulair as well as albuterol as needed and says he actually did really well and his symptoms improved significantly.  Been trying to get him approved for an injection called Nucala.  He is still waiting to hear back on this.  He has been off of the [place] since probably January of this year because his deductible is so high.  The dilator was in a cost around $900.  She is been using prednisone as needed as it does seem to help and improve his symptoms.  But says he is really trying hard not to take much of it.    Hypertention-he is really been working hard on weight loss and dietary changes.  He is Artie lost about 20 pounds so far.  Is off of any blood pressure medication.  Review of Systems  BP 139/81   Pulse 65   Ht  (1.727 m)   Wt 247 lb (112 kg)   SpO2 97%   BMI 37.56 kg/m     Allergies  Allergen Reactions  . Penicillins     REACTION: anaphalatic    Past Medical History:  Diagnosis Date  . Anxiety    anger problems  . Eosinophilic esophagitis   . Fatty liver   . History of methicillin resistant staphylococcus aureus (MRSA)   . Hypertension   . Obesity     Past Surgical History:  Procedure Laterality Date  . ESOPHAGEAL DILATION      Social History   Socioeconomic History  . Marital status: Married    Spouse name: Not on file  . Number of children: Not on file  . Years of education: Not on file  .  Highest education level: Not on file  Occupational History  . Not on file  Social Needs  . Financial resource strain: Not on file  . Food insecurity:    Worry: Not on file    Inability: Not on file  . Transportation needs:    Medical: Not on file    Non-medical: Not on file  Tobacco Use  . Smoking status: Never Smoker  . Smokeless tobacco: Never Used  Substance and Sexual Activity  . Alcohol use: Yes    Comment: 6 pack of beer 2-3 x a week  . Drug use: No  . Sexual activity: Yes  Lifestyle  . Physical activity:    Days per week: Not on file    Minutes per session: Not on file  . Stress: Not on file  Relationships  . Social connections:    Talks on phone: Not on file    Gets together: Not on file    Attends religious service: Not on file    Active member of club or organization: Not on file    Attends meetings of clubs or organizations: Not on file    Relationship status: Not on file  . Intimate partner violence:  Fear of current or ex partner: Not on file    Emotionally abused: Not on file    Physically abused: Not on file    Forced sexual activity: Not on file  Other Topics Concern  . Not on file  Social History Narrative  . Not on file    Family History  Problem Relation Age of Onset  . Hypertension Mother        father  . Stroke Father     Outpatient Encounter Medications as of 01/05/2018  Medication Sig  . albuterol (PROVENTIL HFA;VENTOLIN HFA) 108 (90 Base) MCG/ACT inhaler Inhale 2 puffs into the lungs every 6 (six) hours as needed for wheezing or shortness of breath.  . ALPRAZolam (XANAX) 0.5 MG tablet Take 1 tablet (0.5 mg total) by mouth at bedtime as needed.  . [facility] ASPIRIN CHILD 81 MG chewable tablet CHEW ONE TABLET (81 MG TOTAL) BY MOUTH DAILY.  . cyclobenzaprine (FLEXERIL) 10 MG tablet Take 1 tablet (10 mg total) by mouth at bedtime as needed for muscle spasms.  . Fluticasone-Salmeterol (ADVAIR) 250-50 MCG/DOSE AEPB Inhale 1 puff into the lungs 2  (two) times daily. Generic please  . testosterone cypionate (DEPOTESTOSTERONE CYPIONATE) 200 MG/ML injection Inject 1 mL (200 mg total) into the muscle every 14 (fourteen) days.  . [DISCONTINUED] benzonatate (TESSALON PERLES) 100 MG capsule Take 1-2 capsules (100-200 mg total) by mouth every 8 (eight) hours as needed.  . [DISCONTINUED] cetirizine (ZYRTEC) 10 MG tablet Take 10 mg by mouth daily.  . [DISCONTINUED] fluticasone furoate-vilanterol (BREO ELLIPTA) 100-25 MCG/INH AEPB Inhale 1 puff into the lungs daily.  . [DISCONTINUED] Fluticasone-Salmeterol (ADVAIR) 250-50 MCG/DOSE AEPB Inhale 1 puff into the lungs 2 (two) times daily. Generic please  . [DISCONTINUED] montelukast (SINGULAIR) 10 MG tablet Take 10 mg by mouth daily.  . [DISCONTINUED] predniSONE (DELTASONE) 20 MG tablet Take 2 tablets (40 mg total) by mouth daily.  . [DISCONTINUED] predniSONE (DELTASONE) 5 MG tablet Take 1 tablet (5 mg total) by mouth as directed. sterapred generic taper  . [DISCONTINUED] PROAIR HFA 108 (90 Base) MCG/ACT inhaler INHALE 2 PUFFS INTO THE LUNGS EVERY 6 (SIX) HOURS AS NEEDED FOR WHEEZING OR SHORTNESS OF BREATH.  . [DISCONTINUED] terbinafine (LAMISIL) 1 % cream Apply 1 application topically 2 (two) times daily. X 3 weeks   No facility-administered encounter medications on file as of 01/05/2018.           Objective:   Physical Exam  Constitutional: He is oriented to person, place, and time. He appears well-developed and well-nourished.  HENT:  Head: Normocephalic and atraumatic.  Right Ear: External ear normal.  Left Ear: External ear normal.  Nose: Nose normal.  Mouth/Throat: Oropharynx is clear and moist.  TMs and canals are clear.   Eyes: Pupils are equal, round, and reactive to light. Conjunctivae and EOM are normal.  Neck: Neck supple. No thyromegaly present.  Cardiovascular: Normal rate and normal heart sounds.  Pulmonary/Chest: Effort normal and breath sounds normal.  Lymphadenopathy:    He  has no cervical adenopathy.  Neurological: He is alert and oriented to person, place, and time.  Skin: Skin is warm and dry.  Psychiatric: He has a normal mood and affect.          Assessment & Plan:  Severe persistent asthma-we discussed switching him to generic Advair.  Coupon card printed to that he can get it for $129.  Also prescription sent for generic albuterol with the cost right around $29.  Unfortunately Spiriva  will still be quite expensive.  We could certainly see if we could try to get samples.  He is supposed to have a follow-up with [facility] later this summer for repeat breathing tests.  He still hoping that they will be able to get the Nucalain so that he can start the injections.  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  We will keep this on his chart for now.  Failed methotrexate.  Elevated blood pressure-blood pressure is borderline today but technically systolic is under 140.  I think the weight loss has helped significantly.  But we will keep an eye on this.

## 2018-01-06 ENCOUNTER — Telehealth: Payer: Self-pay | Admitting: Family Medicine

## 2018-01-06 NOTE — Telephone Encounter (Signed)
Please call drug rep for Spiriva HFA samples for Fayrene Fearing.

## 2018-01-06 NOTE — Telephone Encounter (Signed)
Left VM for drug rep requesting Spiriva samples.

## 2018-01-13 NOTE — Telephone Encounter (Signed)
Received callback from Rosalita Chessman [phone]), she will have a rep come by with samples.

## 2018-01-13 NOTE — Telephone Encounter (Signed)
Called a different Rep to attempt and get samples

## 2018-01-20 NOTE — Telephone Encounter (Signed)
Samples will arrive tomorrow.

## 2018-01-20 NOTE — Telephone Encounter (Signed)
Left another message for rep to try and get samples.

## 2018-01-20 NOTE — Telephone Encounter (Signed)
Samples arrived. Pt advised. Placed up front for pick up.

## 2018-02-04 DIAGNOSIS — I10 Essential (primary) hypertension: Secondary | ICD-10-CM | POA: Diagnosis not present

## 2018-02-04 LAB — COMPLETE METABOLIC PANEL WITH GFR
AG Ratio: 1.6 (calc) (ref 1.0–2.5)
ALBUMIN MSPROF: 4.5 g/dL (ref 3.6–5.1)
ALKALINE PHOSPHATASE (APISO): 49 U/L (ref 40–115)
ALT: 25 U/L (ref 9–46)
AST: 14 U/L (ref 10–40)
BILIRUBIN TOTAL: 1 mg/dL (ref 0.2–1.2)
BUN: 22 mg/dL (ref 7–25)
CHLORIDE: 103 mmol/L (ref 98–110)
CO2: 27 mmol/L (ref 20–32)
Calcium: 9.9 mg/dL (ref 8.6–10.3)
Creat: 0.94 mg/dL (ref 0.60–1.35)
GFR, Est African American: 111 mL/min/{1.73_m2} (ref >=60)
GFR, Est Non African American: 96 mL/min/{1.73_m2} (ref >=60)
GLUCOSE: 140 mg/dL — AB (ref 65–99)
Globulin: 2.8 g/dL (calc) (ref 1.9–3.7)
Potassium: 4.5 mmol/L (ref 3.5–5.3)
SODIUM: 139 mmol/L (ref 135–146)
Total Protein: 7.3 g/dL (ref 6.1–8.1)

## 2018-02-04 LAB — LIPID PANEL
CHOLESTEROL: 202 mg/dL — AB (ref <200)
HDL: 69 mg/dL (ref >40)
LDL CHOLESTEROL (CALC): 115 mg/dL — AB
Non-HDL Cholesterol (Calc): 133 mg/dL (calc) — ABNORMAL HIGH (ref <130)
Total CHOL/HDL Ratio: 2.9 (calc) (ref <5.0)
Triglycerides: 79 mg/dL (ref <150)

## 2018-02-05 ENCOUNTER — Other Ambulatory Visit: Payer: Self-pay | Admitting: *Deleted

## 2018-02-05 DIAGNOSIS — R7309 Other abnormal glucose: Secondary | ICD-10-CM

## 2018-02-08 ENCOUNTER — Ambulatory Visit (INDEPENDENT_AMBULATORY_CARE_PROVIDER_SITE_OTHER): Payer: BLUE CROSS/BLUE SHIELD | Admitting: Sports Medicine

## 2018-02-08 DIAGNOSIS — M545 Low back pain, unspecified: Secondary | ICD-10-CM

## 2018-02-08 MED ORDER — PREDNISONE 50 MG PO TABS: ORAL_TABLET | ORAL | 0 refills | Status: DC

## 2018-02-08 MED ORDER — MELOXICAM 15 MG PO TABS: ORAL_TABLET | ORAL | 3 refills | Status: DC

## 2018-02-08 MED ORDER — KETOROLAC TROMETHAMINE 30 MG/ML IJ SOLN
30.0000 mg | Freq: Once | INTRAMUSCULAR | Status: AC
  Administered 2018-02-08: 30 mg via INTRAMUSCULAR

## 2018-02-08 MED ORDER — METHYLPREDNISOLONE SODIUM SUCC 125 MG IJ SOLR
125.0000 mg | Freq: Once | INTRAMUSCULAR | Status: AC
  Administered 2018-02-08: 125 mg via INTRAMUSCULAR

## 2018-02-08 NOTE — Progress Notes (Signed)
Subjective:    I'm seeing this patient as a consultation for: Dr. Nani Gasser  CC: Low back pain  HPI: This is a pleasant 47 year old male, he is had on and off low back pain, moderate, persistent, localized in the midline of the low back, worse with sitting, flexion, Valsalva.  No bowel or bladder dysfunction, saddle numbness, constitutional symptoms.  4 years ago we treated him and he responded well to conservative measures.  I reviewed the past medical history, family history, social history, surgical history, and allergies today and no changes were needed.  Please see the problem list section below in epic for further details.  Past Medical History: Past Medical History:  Diagnosis Date  . Anxiety    anger problems  . Eosinophilic esophagitis   . Fatty liver   . History of methicillin resistant staphylococcus aureus (MRSA)   . Hypertension   . Obesity    Past Surgical History: Past Surgical History:  Procedure Laterality Date  . ESOPHAGEAL DILATION     Social History: Social History   Socioeconomic History  . Marital status: Married    Spouse name: Not on file  . Number of children: Not on file  . Years of education: Not on file  . Highest education level: Not on file  Occupational History  . Not on file  Social Needs  . Financial resource strain: Not on file  . Food insecurity:    Worry: Not on file    Inability: Not on file  . Transportation needs:    Medical: Not on file    Non-medical: Not on file  Tobacco Use  . Smoking status: Never Smoker  . Smokeless tobacco: Never Used  Substance and Sexual Activity  . Alcohol use: Yes    Comment: 6 pack of beer 2-3 x a week  . Drug use: No  . Sexual activity: Yes  Lifestyle  . Physical activity:    Days per week: Not on file    Minutes per session: Not on file  . Stress: Not on file  Relationships  . Social connections:    Talks on phone: Not on file    Gets together: Not on file    Attends  religious service: Not on file    Active member of club or organization: Not on file    Attends meetings of clubs or organizations: Not on file    Relationship status: Not on file  Other Topics Concern  . Not on file  Social History Narrative  . Not on file   Family History: Family History  Problem Relation Age of Onset  . Hypertension Mother        father  . Stroke Father    Allergies: Allergies  Allergen Reactions  . Penicillins     REACTION: anaphalatic   Medications: See med rec.  Review of Systems: No headache, visual changes, nausea, vomiting, diarrhea, constipation, dizziness, abdominal pain, skin rash, fevers, chills, night sweats, weight loss, swollen lymph nodes, body aches, joint swelling, muscle aches, chest pain, shortness of breath, mood changes, visual or auditory hallucinations.   Objective:   General: Well Developed, well nourished, and in no acute distress.  Neuro:  Extra-ocular muscles intact, able to move all 4 extremities, sensation grossly intact.  Deep tendon reflexes tested were normal. Psych: Alert and oriented, mood congruent with affect. ENT:  Ears and nose appear unremarkable.  Hearing grossly normal. Neck: Unremarkable overall appearance, trachea midline.  No visible thyroid enlargement. Eyes: Conjunctivae  and lids appear unremarkable.  Pupils equal and round. Skin: Warm and dry, no rashes noted.  Cardiovascular: Pulses palpable, no extremity edema. Back Exam:  Inspection: Unremarkable  Motion: Flexion 45 deg, Extension 45 deg, Side Bending to 45 deg bilaterally,  Rotation to 45 deg bilaterally  SLR laying: Negative  XSLR laying: Negative  Palpable tenderness: None. FABER: negative. Sensory change: Gross sensation intact to all lumbar and sacral dermatomes.  Reflexes: 2+ at both patellar tendons, 2+ at achilles tendons, Babinski's downgoing.  Strength at foot  Plantar-flexion: 5/5 Dorsi-flexion: 5/5 Eversion: 5/5 Inversion: 5/5  Leg strength   Quad: 5/5 Hamstring: 5/5 Hip flexor: 5/5 Hip abductors: 5/5  Gait unremarkable.  Impression and Recommendations:   This case required medical decision making of moderate complexity.  Low back pain She will discogenic back pain, L4-L5 degenerative changes on the previous x-ray. Toradol 30, Solu-Medrol 125 intramuscular. Prednisone, meloxicam, rehab exercises, return to see me in 1 month, MR for interventional planning if no better. ___________________________________________ Ihor Austinhomas J. Benjamin Stainhekkekandam, M.D., ABFM., CAQSM. Primary Care and Sports Medicine [place] [facility]  Adjunct Instructor of Family Medicine  [employer]

## 2018-02-08 NOTE — Assessment & Plan Note (Signed)
She will discogenic back pain, L4-L5 degenerative changes on the previous x-ray. Toradol 30, Solu-Medrol 125 intramuscular. Prednisone, meloxicam, rehab exercises, return to see me in 1 month, MR for interventional planning if no better.

## 2018-03-02 IMAGING — CT CT CHEST W/O CM
2 of 3 series · 15 of 36 positions shown, 18 images · non-contrast
Comparison: PA and lateral chest 10/16/2015 and 03/16/2015.

CLINICAL DATA: Cough and wheezing for 7 months.

EXAM:
CT CHEST WITHOUT CONTRAST
TECHNIQUE: Multidetector CT imaging of the chest was performed following the
standard protocol without IV contrast.

[Series 2: thorax · axial · 0.77mm/px · z∈[-324,-99]mm · 12 of 53 slices shown, 15 images]
[im 4/53  mediastinal]
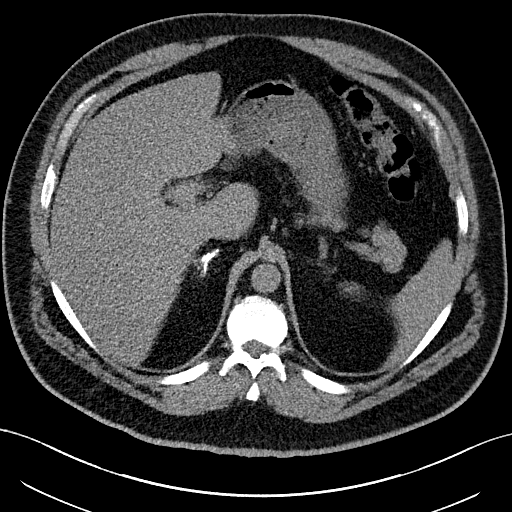
[im 4/53  lung]
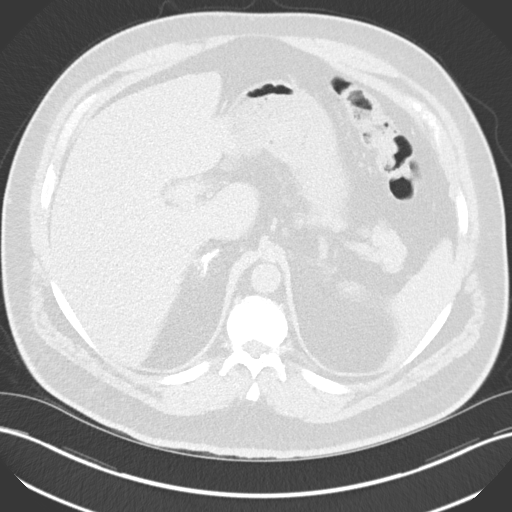
[im 8/53  lung]
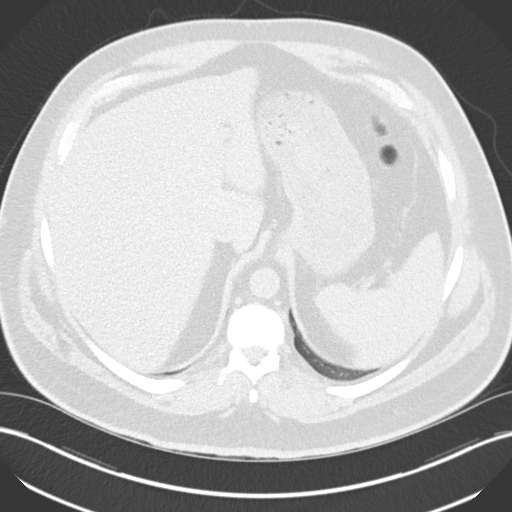
[im 12/53  lung]
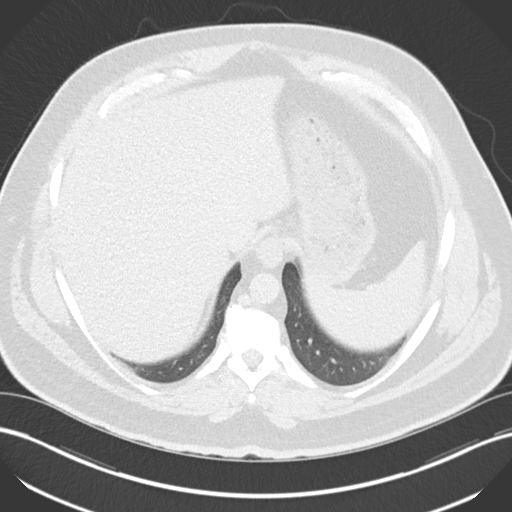
[im 16/53  lung]
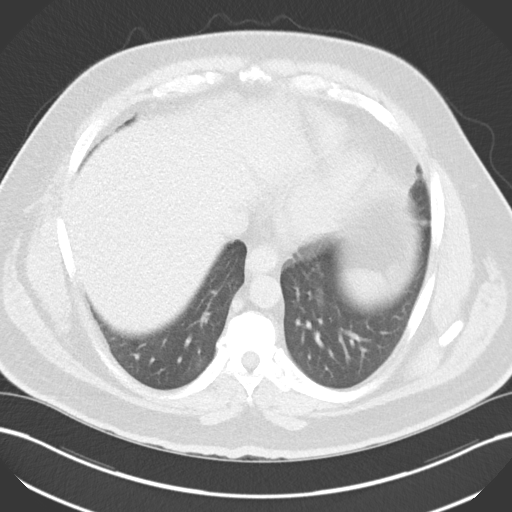
[im 20/53  mediastinal]
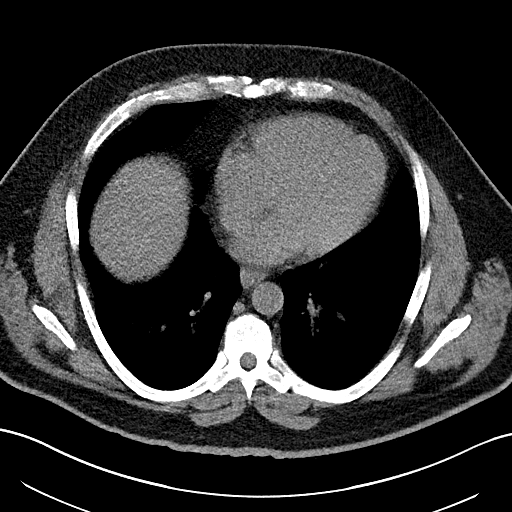
[im 20/53  lung]
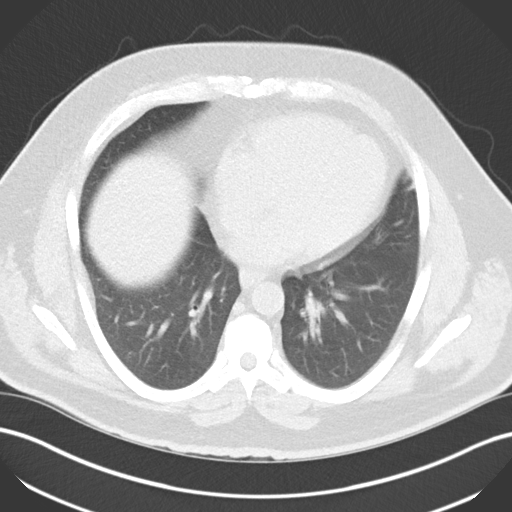
[im 24/53  lung]
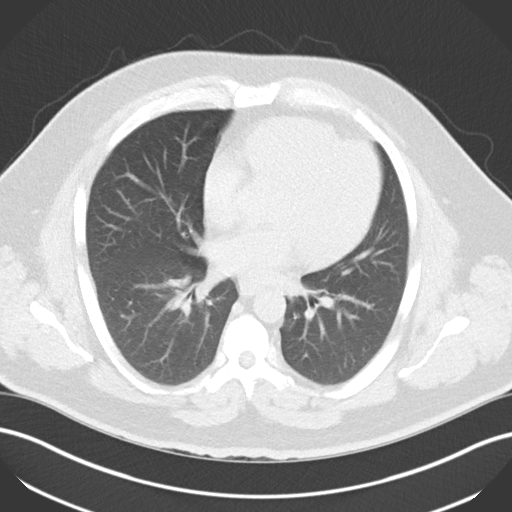
[im 29/53  lung]
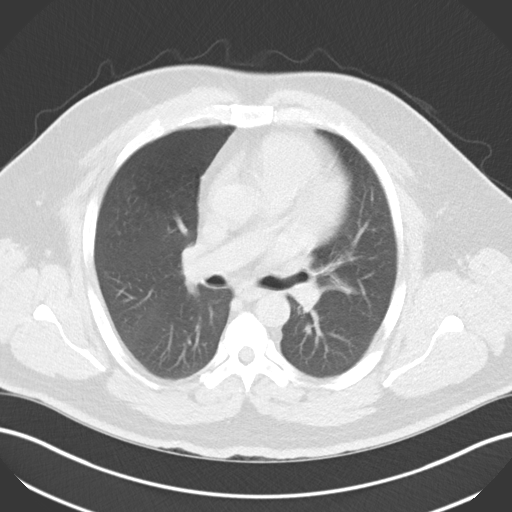
[im 33/53  lung]
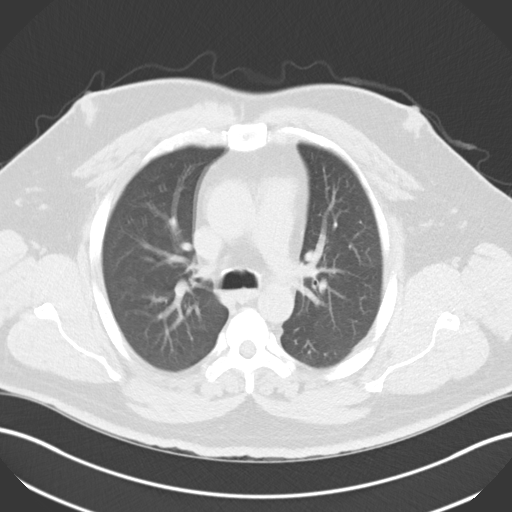
[im 37/53  mediastinal]
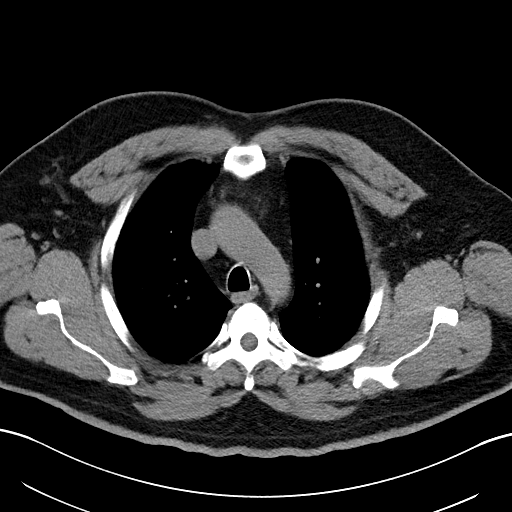
[im 37/53  lung]
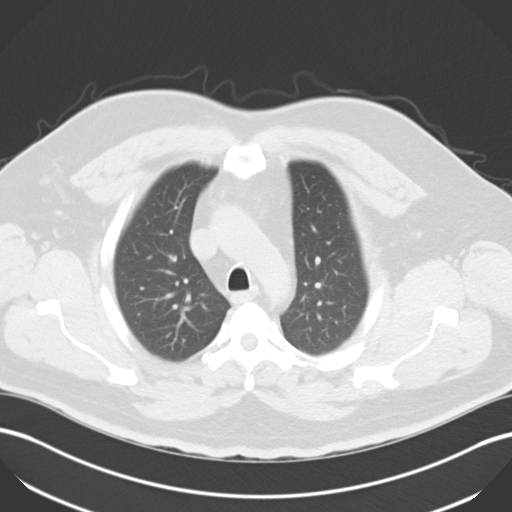
[im 41/53  lung]
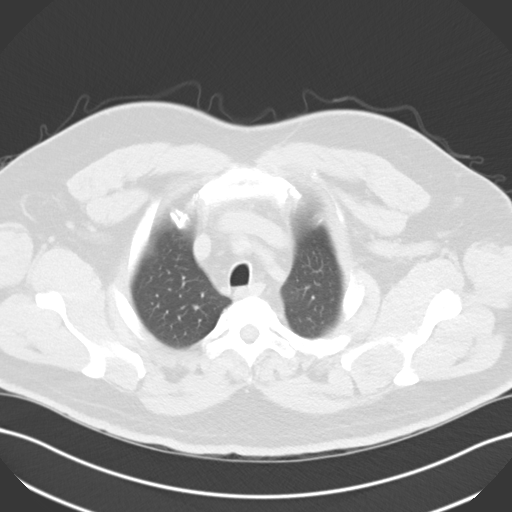
[im 45/53  lung]
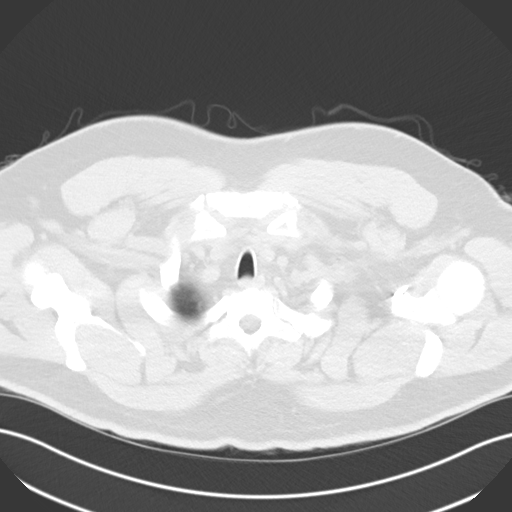
[im 49/53  lung]
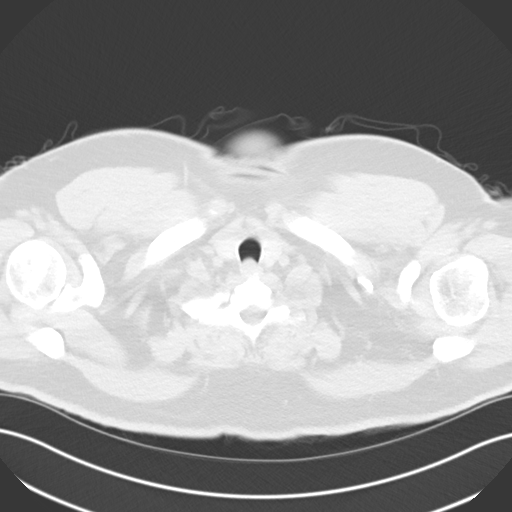

[Series 5: coronal · coronal · 0.54mm/px · 3 of 176 slices shown]
[im 36/176  lung]
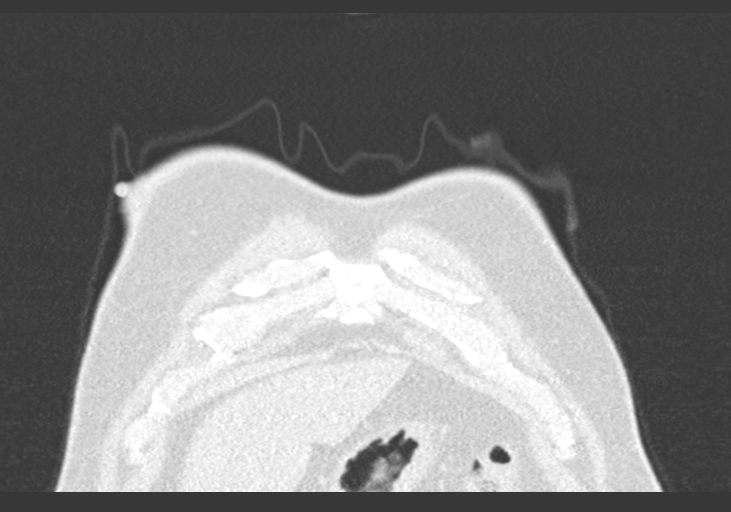
[im 71/176  lung]
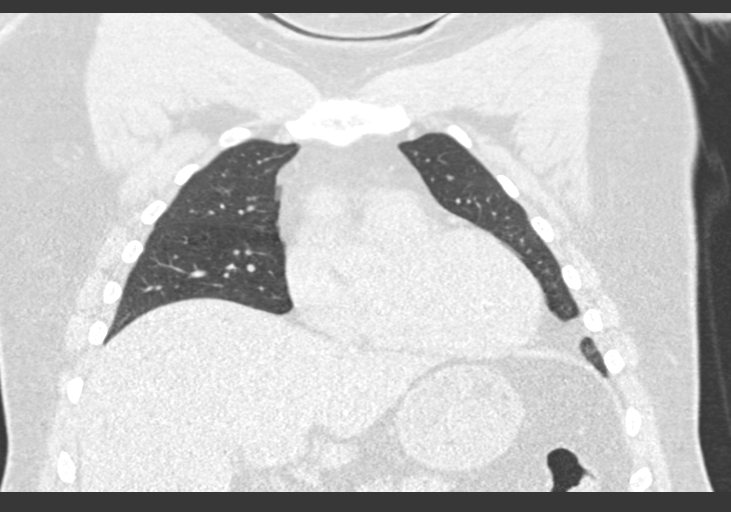
[im 106/176  lung]
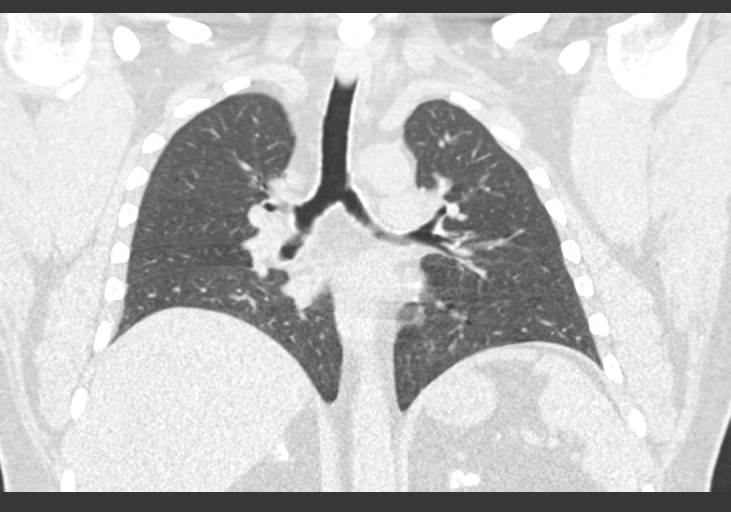

[15 of 36 positions shown; findings below may reference images not displayed]

FINDINGS: There is no pleural or pericardial effusion. No axillary, hilar or
mediastinal lymphadenopathy. No calcific atherosclerosis is
identified. The lungs are clear.

Incidentally imaged upper abdomen demonstrates calcified adrenal
glands bilaterally consistent with either remote hemorrhage or
granulomatous disease. No acute bony abnormality is identified.
Postoperative change left shoulder noted.
IMPRESSION: No acute disease.  No finding to explain the patient's symptoms.

## 2018-03-11 ENCOUNTER — Ambulatory Visit: Payer: BLUE CROSS/BLUE SHIELD | Admitting: Sports Medicine

## 2018-03-31 ENCOUNTER — Encounter: Payer: Self-pay | Admitting: Physician Assistant

## 2018-03-31 ENCOUNTER — Ambulatory Visit (INDEPENDENT_AMBULATORY_CARE_PROVIDER_SITE_OTHER): Payer: BLUE CROSS/BLUE SHIELD | Admitting: Physician Assistant

## 2018-03-31 VITALS — BP 117/52 | HR 61 | Temp 97.7°F | Wt 242.0 lb

## 2018-03-31 DIAGNOSIS — R300 Dysuria: Secondary | ICD-10-CM | POA: Diagnosis not present

## 2018-03-31 DIAGNOSIS — N200 Calculus of kidney: Secondary | ICD-10-CM

## 2018-03-31 MED ORDER — CIPROFLOXACIN HCL 500 MG PO TABS: 500.0000 mg | ORAL_TABLET | Freq: Two times a day (BID) | ORAL | 0 refills | Status: AC

## 2018-03-31 NOTE — Patient Instructions (Signed)
Dietary Guidelines to Help Prevent Kidney Stones Kidney stones are deposits of minerals and salts that form inside your kidneys. Your risk of developing kidney stones may be greater depending on your diet, your lifestyle, the medicines you take, and whether you have certain medical conditions. Most people can reduce their chances of developing kidney stones by following the instructions below. Depending on your overall health and the type of kidney stones you tend to develop, your dietitian may give you more specific instructions. What are tips for following this plan? Reading food labels  Choose foods with "no salt added" or "low-salt" labels. Limit your sodium intake to less than 1500 mg per day.  Choose foods with calcium for each meal and snack. Try to eat about 300 mg of calcium at each meal. Foods that contain 200-500 mg of calcium per serving include: ? 8 oz (237 ml) of milk, fortified nondairy milk, and fortified fruit juice. ? 8 oz (237 ml) of kefir, yogurt, and soy yogurt. ? 4 oz (118 ml) of tofu. ? 1 oz of cheese. ? 1 cup (300 g) of dried figs. ? 1 cup (91 g) of cooked broccoli. ? 1-3 oz can of sardines or mackerel.  Most people need 1000 to 1500 mg of calcium each day. Talk to your dietitian about how much calcium is recommended for you. Shopping  Buy plenty of fresh fruits and vegetables. Most people do not need to avoid fruits and vegetables, even if they contain nutrients that may contribute to kidney stones.  When shopping for convenience foods, choose: ? Whole pieces of fruit. ? Premade salads with dressing on the side. ? Low-fat fruit and yogurt smoothies.  Avoid buying frozen meals or prepared deli foods.  Look for foods with live cultures, such as yogurt and kefir. Cooking  Do not add salt to food when cooking. Place a salt shaker on the table and allow each person to add his or her own salt to taste.  Use vegetable protein, such as beans, textured vegetable  protein (TVP), or tofu instead of meat in pasta, casseroles, and soups. Meal planning  Eat less salt, if told by your dietitian. To do this: ? Avoid eating processed or premade food. ? Avoid eating fast food.  Eat less animal protein, including cheese, meat, poultry, or fish, if told by your dietitian. To do this: ? Limit the number of times you have meat, poultry, fish, or cheese each week. Eat a diet free of meat at least 2 days a week. ? Eat only one serving each day of meat, poultry, fish, or seafood. ? When you prepare animal protein, cut pieces into small portion sizes. For most meat and fish, one serving is about the size of one deck of cards.  Eat at least 5 servings of fresh fruits and vegetables each day. To do this: ? Keep fruits and vegetables on hand for snacks. ? Eat 1 piece of fruit or a handful of berries with breakfast. ? Have a salad and fruit at lunch. ? Have two kinds of vegetables at dinner.  Limit foods that are high in a substance called oxalate. These include: ? Spinach. ? Rhubarb. ? Beets. ? Potato chips and french fries. ? Nuts.  If you regularly take a diuretic medicine, make sure to eat at least 1-2 fruits or vegetables high in potassium each day. These include: ? Avocado. ? Banana. ? Orange, prune, carrot, or tomato juice. ? Baked potato. ? Cabbage. ? Beans and split peas.   General instructions  Drink enough fluid to keep your urine clear or pale yellow. This is the most important thing you can do.  Talk to your health care provider and dietitian about taking daily supplements. Depending on your health and the cause of your kidney stones, you may be advised: ? Not to take supplements with vitamin C. ? To take a calcium supplement. ? To take a daily probiotic supplement. ? To take other supplements such as magnesium, fish oil, or vitamin B6.  Take all medicines and supplements as told by your health care provider.  Limit alcohol intake to no more  than 1 drink a day for nonpregnant women and 2 drinks a day for men. One drink equals 12 oz of beer, 5 oz of wine, or 1 oz of hard liquor.  Lose weight if told by your health care provider. Work with your dietitian to find strategies and an eating plan that works best for you. What foods are not recommended? Limit your intake of the following foods, or as told by your dietitian. Talk to your dietitian about specific foods you should avoid based on the type of kidney stones and your overall health. Grains Breads. Bagels. Rolls. Baked goods. Salted crackers. Cereal. Pasta. Vegetables Spinach. Rhubarb. Beets. Canned vegetables. Pickles. Olives. Meats and other protein foods Nuts. Nut butters. Large portions of meat, poultry, or fish. Salted or cured meats. Deli meats. Hot dogs. Sausages. Dairy Cheese. Beverages Regular soft drinks. Regular vegetable juice. Seasonings and other foods Seasoning blends with salt. Salad dressings. Canned soups. Soy sauce. Ketchup. Barbecue sauce. Canned pasta sauce. Casseroles. Pizza. Lasagna. Frozen meals. Potato chips. French fries. Summary  You can reduce your risk of kidney stones by making changes to your diet.  The most important thing you can do is drink enough fluid. You should drink enough fluid to keep your urine clear or pale yellow.  Ask your health care provider or dietitian how much protein from animal sources you should eat each day, and also how much salt and calcium you should have each day. This information is not intended to replace advice given to you by your health care provider. Make sure you discuss any questions you have with your health care provider. Document Released: 11/29/2010 Document Revised: 07/15/2016 Document Reviewed: 07/15/2016 Elsevier Interactive Patient Education  2018 [employer] Kidney Stones Kidney stones (urolithiasis) are solid, rock-like deposits that form inside of the organs that make urine (kidneys). A kidney  stone may form in a kidney and move into the bladder, where it can cause intense pain and block the flow of urine. Kidney stones are created when high levels of certain minerals are found in the urine. They are usually passed through urination, but in some cases, medical treatment may be needed to remove them. What are the causes? Kidney stones may be caused by:  A condition in which certain glands produce too much parathyroid hormone (primary hyperparathyroidism), which causes too much calcium buildup in the blood.  Buildup of uric acid crystals in the bladder (hyperuricosuria). Uric acid is a chemical that the body produces when you eat certain foods. It usually exits the body in the urine.  Narrowing (stricture) of one or both of the tubes that drain urine from the kidneys to the bladder (ureters).  A kidney blockage that is present at birth (congenital obstruction).  Past surgery on the kidney or the ureters, such as gastric bypass surgery.  What increases the risk? The following factors make you more   likely to develop kidney stones:  Having had a kidney stone in the past.  Having a family history of kidney stones.  Not drinking enough water.  Eating a diet that is high in protein, salt (sodium), or sugar.  Being overweight or obese.  What are the signs or symptoms? Symptoms of a kidney stone may include:  Nausea.  Vomiting.  Blood in the urine (hematuria).  Pain in the side of the abdomen, right below the ribs (flank pain). Pain usually spreads (radiates) to the groin.  Needing to urinate frequently or urgently.  How is this diagnosed? This condition may be diagnosed based on:  Your medical history.  A physical exam.  Blood tests.  Urine tests.  CT scan.  Abdominal X-ray.  A procedure to examine the inside of the bladder (cystoscopy).  How is this treated? Treatment for kidney stones depends on the size, location, and makeup of the stones. Treatment may  involve:  Analyzing your urine before and after you pass the stone through urination.  Being monitored at the hospital until you pass the stone through urination.  Increasing your fluid intake and decreasing the amount of calcium and protein in your diet.  A procedure to break up kidney stones in the bladder using: ? A focused beam of light (laser therapy). ? Shock waves (extracorporeal shock wave lithotripsy).  Surgery to remove kidney stones. This may be needed if you have severe pain or have stones that block your urinary tract.  Follow these instructions at home: Eating and drinking   Drink enough fluid to keep your urine clear or pale yellow. This will help you to pass the kidney stone.  If directed, change your diet. This may include: ? Limiting how much sodium you eat. ? Eating more fruits and vegetables. ? Limiting how much meat, poultry, fish, and eggs you eat.  Follow instructions from your health care provider about eating or drinking restrictions. General instructions  Collect urine samples as told by your health care provider. You may need to collect a urine sample: ? 24 hours after you pass the stone. ? 8-12 weeks after passing the kidney stone, and every 6-12 months after that.  Strain your urine every time you urinate, for as long as directed. Use the strainer that your health care provider recommends.  Do not throw out the kidney stone after passing it. Keep the stone so it can be tested by your health care provider. Testing the makeup of your kidney stone may help prevent you from getting kidney stones in the future.  Take over-the-counter and prescription medicines only as told by your health care provider.  Keep all follow-up visits as told by your health care provider. This is important. You may need follow-up X-rays or ultrasounds to make sure that your stone has passed. How is this prevented? To prevent another kidney stone:  Drink enough fluid to keep  your urine clear or pale yellow. This is the best way to prevent kidney stones.  Eat a healthy diet and follow recommendations from your health care provider about foods to avoid. You may be instructed to eat a low-protein diet. Recommendations vary depending on the type of kidney stone that you have.  Maintain a healthy weight.  Contact a health care provider if:  You have pain that gets worse or does not get better with medicine. Get help right away if:  You have a fever or chills.  You develop severe pain.  You develop new abdominal   pain.  You faint.  You are unable to urinate. This information is not intended to replace advice given to you by your health care provider. Make sure you discuss any questions you have with your health care provider. Document Released: 08/04/2005 Document Revised: 02/22/2016 Document Reviewed: 01/18/2016 Elsevier Interactive Patient Education  2018 Elsevier Inc.  

## 2018-03-31 NOTE — Progress Notes (Signed)
HPI:                                                                Christian Gregory is a 47 y.o. male who [facility] today for back pain / dysuria  Reports 1 month of persistent left-sided low back pain, gradually worsening last night. States he had an episode of painful burning urination, LLQ pain radiating to the testicle, nausea and chills last night around 2 am. He took Ibuprofen and eventually fell asleep. When he urinated this morning states he felt something pass through his urethra and when he looked in the toilet he noticed a stone. Since the passage of the stone all symptoms have resolved. Denies fever, abdominal pain, nausea/vomting, hematuria, dysuria. Denies history of nephrolithiasis or UTI.   Past Medical History:  Diagnosis Date  . Anxiety    anger problems  . Eosinophilic esophagitis   . Fatty liver   . History of methicillin resistant staphylococcus aureus (MRSA)   . Hypertension   . Obesity    Past Surgical History:  Procedure Laterality Date  . ESOPHAGEAL DILATION     Social History   Tobacco Use  . Smoking status: Never Smoker  . Smokeless tobacco: Never Used  Substance Use Topics  . Alcohol use: Yes    Comment: 6 pack of beer 2-3 x a week   family history includes Hypertension in his mother; Stroke in his father.    ROS: negative except as noted in the HPI  Medications: Current Outpatient Medications  Medication Sig Dispense Refill  . albuterol (PROVENTIL HFA;VENTOLIN HFA) 108 (90 Base) MCG/ACT inhaler Inhale 2 puffs into the lungs every 6 (six) hours as needed for wheezing or shortness of breath. 1 Inhaler 11  . ALPRAZolam (XANAX) 0.5 MG tablet Take 1 tablet (0.5 mg total) by mouth at bedtime as needed. 30 tablet 2  . ciprofloxacin (CIPRO) 500 MG tablet Take 1 tablet (500 mg total) by mouth 2 (two) times daily for 7 days. 14 tablet 0  . [facility] ASPIRIN CHILD 81 MG chewable tablet CHEW ONE  TABLET (81 MG TOTAL) BY MOUTH DAILY. 36 tablet 5  . cyclobenzaprine (FLEXERIL) 10 MG tablet Take 1 tablet (10 mg total) by mouth at bedtime as needed for muscle spasms. 30 tablet 0  . Fluticasone-Salmeterol (ADVAIR) 250-50 MCG/DOSE AEPB Inhale 1 puff into the lungs 2 (two) times daily. Generic please 60 each 5  . meloxicam (MOBIC) 15 MG tablet One tab PO qAM with breakfast for 2 weeks, then daily prn pain. 30 tablet 3  . testosterone cypionate (DEPOTESTOSTERONE CYPIONATE) 200 MG/ML injection Inject 1 mL (200 mg total) into the muscle every 14 (fourteen) days. 10 mL 1   No current facility-administered medications for this visit.    Allergies  Allergen Reactions  . Penicillins Anaphylaxis       Objective:  BP (!) 117/52   Pulse 61   Temp 97.7 F (36.5 C) (Oral)   Wt 242 lb (109.8 kg)   BMI 36.80 kg/m  Gen:  alert, not ill-appearing, no distress, appropriate for age, obese male HEENT: head normocephalic without obvious abnormality, conjunctiva and cornea clear, trachea midline Pulm: Normal work of breathing, normal phonation GI: abdomen soft,  non-tender, non-distended, no CVA tenderness Neuro: alert and oriented x 3, no tremor MSK: extremities atraumatic, normal gait and station Skin: intact, no rashes on exposed skin, no jaundice, no cyanosis Psych: well-groomed, cooperative, good eye contact, euthymic mood, affect mood-congruent, speech is articulate, and thought processes clear and goal-directed    No results found for this or any previous visit (from the past 72 hour(s)). No results found.    Assessment and Plan: 47 y.o. male with   .Christian Gregory was seen today for flank pain.  Diagnoses and all orders for this visit:  Nephrolithiasis -     US RENAL -     Urinalysis, Routine w reflex microscopic -     Urine Culture -     ciprofloxacin (CIPRO) 500 MG tablet; Take 1 tablet (500 mg total) by mouth 2 (two) times daily for 7 days.  Dysuria -     ciprofloxacin (CIPRO) 500  MG tablet; Take 1 tablet (500 mg total) by mouth 2 (two) times daily for 7 days.   Patient has passed what appears to be a kidney stone and no longer has symptoms of renal colic. Given that symptoms were present for 1 month, I will cover him for complicated UTI with Cipro. UA and urine culture pending. However, no evidence of pyelonephritis on exam today. Afebrile, no tachycardia, no CVA tenderness. Will also assess for hydronephrosis/presence of additional stones with renal US.   Patient education and anticipatory guidance given Patient agrees with treatment plan Follow-up as needed if symptoms worsen or fail to improve  Levonne Hubertharley E. Kalyn Hofstra PA-C

## 2018-04-01 LAB — URINALYSIS, ROUTINE W REFLEX MICROSCOPIC
Bacteria, UA: NONE SEEN /HPF
Bilirubin Urine: NEGATIVE
GLUCOSE, UA: NEGATIVE
HYALINE CAST: NONE SEEN /LPF
Ketones, ur: NEGATIVE
LEUKOCYTES UA: NEGATIVE
Nitrite: NEGATIVE
Protein, ur: NEGATIVE
SPECIFIC GRAVITY, URINE: 1.022 (ref 1.001–1.03)
Squamous Epithelial / LPF: NONE SEEN /HPF (ref <=5)
WBC UA: NONE SEEN /HPF (ref 0–5)
pH: 6 (ref 5.0–8.0)

## 2018-04-01 LAB — URINE CULTURE
MICRO NUMBER:: 90967138
RESULT: NO GROWTH
SPECIMEN QUALITY:: ADEQUATE

## 2018-04-01 NOTE — Progress Notes (Signed)
Urine contains some blood, which would be expected with passing a stone. So far no evidence of infection, but still awaiting the results of the urine culture. Continue the Cipro for now.

## 2018-04-20 ENCOUNTER — Encounter: Payer: Self-pay | Admitting: Family Medicine

## 2018-04-20 DIAGNOSIS — M545 Low back pain, unspecified: Secondary | ICD-10-CM

## 2018-04-21 ENCOUNTER — Ambulatory Visit (INDEPENDENT_AMBULATORY_CARE_PROVIDER_SITE_OTHER): Payer: BLUE CROSS/BLUE SHIELD | Admitting: Sports Medicine

## 2018-04-21 ENCOUNTER — Ambulatory Visit (INDEPENDENT_AMBULATORY_CARE_PROVIDER_SITE_OTHER): Payer: BLUE CROSS/BLUE SHIELD

## 2018-04-21 ENCOUNTER — Encounter: Payer: Self-pay | Admitting: Sports Medicine

## 2018-04-21 DIAGNOSIS — G5603 Carpal tunnel syndrome, bilateral upper limbs: Secondary | ICD-10-CM

## 2018-04-21 DIAGNOSIS — M79642 Pain in left hand: Secondary | ICD-10-CM | POA: Diagnosis not present

## 2018-04-21 DIAGNOSIS — M4807 Spinal stenosis, lumbosacral region: Secondary | ICD-10-CM

## 2018-04-21 DIAGNOSIS — G8929 Other chronic pain: Secondary | ICD-10-CM

## 2018-04-21 DIAGNOSIS — M545 Low back pain, unspecified: Secondary | ICD-10-CM

## 2018-04-21 DIAGNOSIS — M79641 Pain in right hand: Secondary | ICD-10-CM | POA: Diagnosis not present

## 2018-04-21 MED ORDER — PREDNISONE 50 MG PO TABS
ORAL_TABLET | ORAL | 0 refills | Status: DC
Start: 2018-04-21 — End: 2018-04-30

## 2018-04-21 MED ORDER — CELECOXIB 200 MG PO CAPS: ORAL_CAPSULE | ORAL | 2 refills | Status: DC

## 2018-04-21 MED ORDER — GABAPENTIN 300 MG PO CAPS: ORAL_CAPSULE | ORAL | 3 refills | Status: DC

## 2018-04-21 NOTE — Progress Notes (Addendum)
Subjective:    CC: Multiple issues  HPI: Bilateral hand pain: Localized at the IP and MCPs, as well as numbness and tingling, known carpal tunnel syndrome.  Previous injection/Hydrodissection under ultrasound guidance was a long time ago.  He has not been wearing his nighttime splinting.  He is interested in a surgical opinion.  Back pain: Known L4-L5 degenerative disc disease, he is responded well to occasional burst of prednisone and rehab exercises in the past.  Unfortunately continues to have a recurrence of pain in spite of multiple conservative measures, he has not yet picked up his prednisone though.  I reviewed the past medical history, family history, social history, surgical history, and allergies today and no changes were needed.  Please see the problem list section below in epic for further details.  Past Medical History: Past Medical History:  Diagnosis Date  . Anxiety    anger problems  . Eosinophilic esophagitis   . Fatty liver   . History of methicillin resistant staphylococcus aureus (MRSA)   . Hypertension   . Obesity    Past Surgical History: Past Surgical History:  Procedure Laterality Date  . ESOPHAGEAL DILATION     Social History: Social History   Socioeconomic History  . Marital status: Married    Spouse name: Not on file  . Number of children: Not on file  . Years of education: Not on file  . Highest education level: Not on file  Occupational History  . Not on file  Social Needs  . Financial resource strain: Not on file  . Food insecurity:    Worry: Not on file    Inability: Not on file  . Transportation needs:    Medical: Not on file    Non-medical: Not on file  Tobacco Use  . Smoking status: Never Smoker  . Smokeless tobacco: Never Used  Substance and Sexual Activity  . Alcohol use: Yes    Comment: 6 pack of beer 2-3 x a week  . Drug use: No  . Sexual activity: Yes  Lifestyle  . Physical activity:    Days per week: Not on file   Minutes per session: Not on file  . Stress: Not on file  Relationships  . Social connections:    Talks on phone: Not on file    Gets together: Not on file    Attends religious service: Not on file    Active member of club or organization: Not on file    Attends meetings of clubs or organizations: Not on file    Relationship status: Not on file  Other Topics Concern  . Not on file  Social History Narrative  . Not on file   Family History: Family History  Problem Relation Age of Onset  . Hypertension Mother        father  . Stroke Father    Allergies: Allergies  Allergen Reactions  . Penicillins Anaphylaxis   Medications: See med rec.  Review of Systems: No fevers, chills, night sweats, weight loss, chest pain, or shortness of breath.   Objective:    General: Well Developed, well nourished, and in no acute distress.  Neuro: Alert and oriented x3, extra-ocular muscles intact, sensation grossly intact.  HEENT: Normocephalic, atraumatic, pupils equal round reactive to light, neck supple, no masses, no lymphadenopathy, thyroid nonpalpable.  Skin: Warm and dry, no rashes. Cardiac: Regular rate and rhythm, no murmurs rubs or gallops, no lower extremity edema.  Respiratory: Clear to auscultation bilaterally. Not using accessory  muscles, speaking in full sentences.  Impression and Recommendations:    Carpal tunnel syndrome, bilateral Adding gabapentin at bedtime, he will return to nighttime splinting. Bilateral hand x-rays, adding Celebrex as he does have some element of primary osteoarthritis of both hands. Adding hand physical therapy as well. Considering duration of symptoms I would like hand surgery to weigh in, he has had his deductible this year.  Low back pain L4-L5 DDD, added another burst of prednisone to proceeding to MRI at this point.  There is a very large L5-S1 disc extrusion compressing multiple nerve roots and creating some central canal stenosis. Considering  the size of this extrusion I would like a second opinion from [facility] and Dr. Yevette Edwards.  I do still suspect that he will simply need some epidurals and therapy as well as a tincture of time.  I spent 25 minutes with this patient, greater than 50% was face-to-face time counseling regarding the above diagnoses ___________________________________________ Ihor Austin. Benjamin Stain, M.D., ABFM., CAQSM. Primary Care and Sports Medicine [facility]  Adjunct Instructor of Family Medicine  [employer]

## 2018-04-21 NOTE — Assessment & Plan Note (Signed)
Did well with Toradol, Solu-Medrol. Does have L4-L5 degenerative changes on x-rays, reinjured back, adding a 5-day burst of prednisone, he can continue rehab exercises, if persistent pain we will proceed with MRI for interventional planning.

## 2018-04-21 NOTE — Assessment & Plan Note (Addendum)
L4-L5 DDD, added another burst of prednisone to proceeding to MRI at this point.  There is a very large L5-S1 disc extrusion compressing multiple nerve roots and creating some central canal stenosis. Considering the size of this extrusion I would like a second opinion from [facility] and Dr. Yevette Edwards.  I do still suspect that he will simply need some epidurals and therapy as well as a tincture of time.

## 2018-04-21 NOTE — Assessment & Plan Note (Signed)
Adding gabapentin at bedtime, he will return to nighttime splinting. Bilateral hand x-rays, adding Celebrex as he does have some element of primary osteoarthritis of both hands. Adding hand physical therapy as well. Considering duration of symptoms I would like hand surgery to weigh in, he has had his deductible this year.

## 2018-04-22 ENCOUNTER — Encounter: Payer: Self-pay | Admitting: Sports Medicine

## 2018-04-26 ENCOUNTER — Ambulatory Visit (INDEPENDENT_AMBULATORY_CARE_PROVIDER_SITE_OTHER): Payer: BLUE CROSS/BLUE SHIELD

## 2018-04-26 ENCOUNTER — Other Ambulatory Visit: Payer: BLUE CROSS/BLUE SHIELD

## 2018-04-26 DIAGNOSIS — M4807 Spinal stenosis, lumbosacral region: Secondary | ICD-10-CM

## 2018-04-26 DIAGNOSIS — N200 Calculus of kidney: Secondary | ICD-10-CM

## 2018-04-26 DIAGNOSIS — M5117 Intervertebral disc disorders with radiculopathy, lumbosacral region: Secondary | ICD-10-CM

## 2018-04-26 DIAGNOSIS — M545 Low back pain, unspecified: Secondary | ICD-10-CM

## 2018-04-26 DIAGNOSIS — N23 Unspecified renal colic: Secondary | ICD-10-CM | POA: Diagnosis not present

## 2018-04-26 NOTE — Addendum Note (Signed)
Addended by: Monica Becton on: 04/26/2018 04:35 PM   Modules accepted: Orders

## 2018-04-27 ENCOUNTER — Encounter: Payer: Self-pay | Admitting: Sports Medicine

## 2018-04-27 NOTE — Telephone Encounter (Signed)
Received update from [facility], they will not see Pt due to outstanding balance. Referral changed.

## 2018-04-28 ENCOUNTER — Encounter: Payer: Self-pay | Admitting: Rehabilitative and Restorative Service Providers"

## 2018-04-28 ENCOUNTER — Ambulatory Visit (INDEPENDENT_AMBULATORY_CARE_PROVIDER_SITE_OTHER): Payer: BLUE CROSS/BLUE SHIELD | Admitting: Rehabilitative and Restorative Service Providers"

## 2018-04-28 DIAGNOSIS — M25532 Pain in left wrist: Secondary | ICD-10-CM | POA: Diagnosis not present

## 2018-04-28 DIAGNOSIS — M6281 Muscle weakness (generalized): Secondary | ICD-10-CM | POA: Diagnosis not present

## 2018-04-28 DIAGNOSIS — R29898 Other symptoms and signs involving the musculoskeletal system: Secondary | ICD-10-CM | POA: Diagnosis not present

## 2018-04-28 DIAGNOSIS — M25531 Pain in right wrist: Secondary | ICD-10-CM

## 2018-04-28 NOTE — Patient Instructions (Addendum)
Neurovascular: Median Nerve Glide With Cervical Bias - Supine    Lie with neck supported, right arm out to side, elbow straight, thumb down, fingers and wrist bent back. Slowly move opposite side ear toward shoulder without pain. Hold 1 min Repeat ___2_ times per set. . Do __2__ sessions per day   Wrist Flexors    Sit, elbows on table and palms together. Slowly lower wrists to table. Keep palms together throughout the stretch. Hold __30_ seconds. Repeat __3_ times per session. Do _2-3__ sessions per day.  Scapula Adduction With Pectoralis Stretch: Low - Standing   Shoulders at 45 hands even with shoulders, keeping weight through legs, shift weight forward until you feel pull or stretch through the front of your chest. Hold _30__ seconds. Do _3__ times, _2-4__ times per day.   Scapula Adduction With Pectoralis Stretch: Mid-Range - Standing   Shoulders at 90 elbows even with shoulders, keeping weight through legs, shift weight forward until you feel pull or strength through the front of your chest. Hold __30_ seconds. Do _3__ times, __2-4_ times per day.   Scapula Adduction With Pectoralis Stretch: High - Standing   Shoulders at 120 hands up high on the doorway, keeping weight on feet, shift weight forward until you feel pull or stretch through the front of your chest. Hold _30__ seconds. Do _3__ times, _2-3__ times per day.   TENS UNIT: This is helpful for muscle pain and spasm.   Search and Purchase a TENS 7000 2nd edition at www.tenspros.com. It should be less than $30.     TENS unit instructions: Do not shower or bathe with the unit on Turn the unit off before removing electrodes or batteries If the electrodes lose stickiness add a drop of water to the electrodes after they are disconnected from the unit and place on plastic sheet. If you continued to have difficulty, call the TENS unit company to purchase more electrodes. Do not apply lotion on the skin area prior  to use. Make sure the skin is clean and dry as this will help prolong the life of the electrodes. After use, always check skin for unusual red areas, rash or other skin difficulties. If there are any skin problems, does not apply electrodes to the same area. Never remove the electrodes from the unit by pulling the wires. Do not use the TENS unit or electrodes other than as directed. Do not change electrode placement without consultating your therapist or physician. Keep 2 fingers with between each electrode.    Self massage using ~ 4 inch plastic ball
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
## 2018-04-30 ENCOUNTER — Encounter: Payer: Self-pay | Admitting: Sports Medicine

## 2018-04-30 ENCOUNTER — Ambulatory Visit (INDEPENDENT_AMBULATORY_CARE_PROVIDER_SITE_OTHER): Payer: BLUE CROSS/BLUE SHIELD | Admitting: Sports Medicine

## 2018-04-30 DIAGNOSIS — M5416 Radiculopathy, lumbar region: Secondary | ICD-10-CM | POA: Diagnosis not present

## 2018-04-30 NOTE — Assessment & Plan Note (Signed)
There is a giant left L5-S1 disc extrusion. Contacting the left L5 and S1 nerve roots. No symptoms of cauda equina syndrome, proceeding with left selective L5 and S1 nerve root blocks and he still needs to touch base with Dr. Yevette Edwardsumonski with spine surgery. Significantly better after the burst of prednisone.

## 2018-04-30 NOTE — Progress Notes (Signed)
Subjective:    CC: Follow-up  HPI: Christian Gregory returns, we did an MRI that showed a very large disc protrusion at L5-S1, no symptoms of cauda equina syndrome, no progressive weakness.  We did a course of prednisone which helped him significantly but considering the size I referred him to Dr. Yevette Edwards, he just got a phone call from them today.  I reviewed the past medical history, family history, social history, surgical history, and allergies today and no changes were needed.  Please see the problem list section below in epic for further details.  Past Medical History: Past Medical History:  Diagnosis Date  . Anxiety    anger problems  . Eosinophilic esophagitis   . Fatty liver   . History of methicillin resistant staphylococcus aureus (MRSA)   . Hypertension   . Obesity    Past Surgical History: Past Surgical History:  Procedure Laterality Date  . ESOPHAGEAL DILATION     Social History: Social History   Socioeconomic History  . Marital status: Married    Spouse name: Not on file  . Number of children: Not on file  . Years of education: Not on file  . Highest education level: Not on file  Occupational History  . Not on file  Social Needs  . Financial resource strain: Not on file  . Food insecurity:    Worry: Not on file    Inability: Not on file  . Transportation needs:    Medical: Not on file    Non-medical: Not on file  Tobacco Use  . Smoking status: Never Smoker  . Smokeless tobacco: Never Used  Substance and Sexual Activity  . Alcohol use: Yes    Comment: 6 pack of beer 2-3 x a week  . Drug use: No  . Sexual activity: Yes  Lifestyle  . Physical activity:    Days per week: Not on file    Minutes per session: Not on file  . Stress: Not on file  Relationships  . Social connections:    Talks on phone: Not on file    Gets together: Not on file    Attends religious service: Not on file    Active member of club or organization: Not on file    Attends meetings  of clubs or organizations: Not on file    Relationship status: Not on file  Other Topics Concern  . Not on file  Social History Narrative  . Not on file   Family History: Family History  Problem Relation Age of Onset  . Hypertension Mother        father  . Stroke Father    Allergies: Allergies  Allergen Reactions  . Penicillins Anaphylaxis   Medications: See med rec.  Review of Systems: No fevers, chills, night sweats, weight loss, chest pain, or shortness of breath.   Objective:    General: Well Developed, well nourished, and in no acute distress.  Neuro: Alert and oriented x3, extra-ocular muscles intact, sensation grossly intact.  HEENT: Normocephalic, atraumatic, pupils equal round reactive to light, neck supple, no masses, no lymphadenopathy, thyroid nonpalpable.  Skin: Warm and dry, no rashes. Cardiac: Regular rate and rhythm, no murmurs rubs or gallops, no lower extremity edema.  Respiratory: Clear to auscultation bilaterally. Not using accessory muscles, speaking in full sentences.  Impression and Recommendations:    Left lumbar radiculopathy There is a giant left L5-S1 disc extrusion. Contacting the left L5 and S1 nerve roots. No symptoms of cauda equina syndrome, proceeding with  left selective L5 and S1 nerve root blocks and he still needs to touch base with Dr. Yevette Edwardsumonski with spine surgery. Significantly better after the burst of prednisone.  I spent 25 minutes with this patient, greater than 50% was face-to-face time counseling regarding the above diagnoses, we went over the natural history of degenerative disc disease, warning signs as well as went over all of the images. ___________________________________________ Ihor Austinhomas J. Benjamin Stainhekkekandam, M.D., ABFM., CAQSM. Primary Care and Sports Medicine [facility]  Adjunct Instructor of Family Medicine  [employer]

## 2018-05-06 ENCOUNTER — Encounter: Payer: Self-pay | Admitting: Sports Medicine

## 2018-05-06 DIAGNOSIS — M5416 Radiculopathy, lumbar region: Secondary | ICD-10-CM

## 2018-05-07 ENCOUNTER — Encounter: Payer: Self-pay | Admitting: Sports Medicine

## 2018-05-09 ENCOUNTER — Encounter: Payer: Self-pay | Admitting: Family Medicine

## 2018-05-09 DIAGNOSIS — R7309 Other abnormal glucose: Secondary | ICD-10-CM

## 2018-05-09 DIAGNOSIS — E291 Testicular hypofunction: Secondary | ICD-10-CM

## 2018-05-10 ENCOUNTER — Encounter: Payer: Self-pay | Admitting: Sports Medicine

## 2018-05-10 NOTE — Telephone Encounter (Signed)
Called pharmacy per PCP request. Last Rx did have 2 refills left, but the Rx expired. Pt has not had Rx filled since Nov 2018. Checked on controlled substance database, info matches.   Routing for next step prior to contacting Pt.

## 2018-05-11 MED ORDER — TESTOSTERONE CYPIONATE 200 MG/ML IM SOLN: 200.0000 mg | INTRAMUSCULAR | 1 refills | Status: DC

## 2018-05-12 ENCOUNTER — Telehealth: Payer: Self-pay | Admitting: Family Medicine

## 2018-05-12 NOTE — Telephone Encounter (Signed)
Received fax from Covermymeds that Testosterone requires a PA. Information has been sent to the insurance company. Awaiting determination.   

## 2018-05-13 ENCOUNTER — Other Ambulatory Visit: Payer: Self-pay | Admitting: Sports Medicine

## 2018-05-13 DIAGNOSIS — G5603 Carpal tunnel syndrome, bilateral upper limbs: Secondary | ICD-10-CM

## 2018-05-17 NOTE — Telephone Encounter (Signed)
Received fax from insurance that Testosterone is covered from 05/12/2018 through 08/17/2038. Form sent to scan.

## 2018-05-18 ENCOUNTER — Encounter: Payer: Self-pay | Admitting: Sports Medicine

## 2018-05-18 ENCOUNTER — Ambulatory Visit
Admission: RE | Admit: 2018-05-18 | Discharge: 2018-05-18 | Disposition: A | Discharge status: Home / Self Care | Payer: BLUE CROSS/BLUE SHIELD | Source: Ambulatory Visit | Attending: Sports Medicine | Admitting: Sports Medicine

## 2018-05-18 DIAGNOSIS — M5117 Intervertebral disc disorders with radiculopathy, lumbosacral region: Secondary | ICD-10-CM | POA: Diagnosis not present

## 2018-05-18 MED ORDER — IOPAMIDOL (ISOVUE-M 200) INJECTION 41%
1.0000 mL | Freq: Once | INTRAMUSCULAR | Status: AC
  Administered 2018-05-18: 1 mL via EPIDURAL

## 2018-05-18 MED ORDER — METHYLPREDNISOLONE ACETATE 40 MG/ML INJ SUSP (RADIOLOG
120.0000 mg | Freq: Once | INTRAMUSCULAR | Status: AC
  Administered 2018-05-18: 120 mg via EPIDURAL

## 2018-05-18 NOTE — Discharge Instructions (Signed)

## 2018-05-20 ENCOUNTER — Ambulatory Visit: Payer: BLUE CROSS/BLUE SHIELD | Admitting: Sports Medicine

## 2018-05-28 ENCOUNTER — Ambulatory Visit: Payer: BLUE CROSS/BLUE SHIELD | Admitting: Sports Medicine

## 2018-05-31 DIAGNOSIS — M5117 Intervertebral disc disorders with radiculopathy, lumbosacral region: Secondary | ICD-10-CM | POA: Diagnosis not present

## 2018-05-31 DIAGNOSIS — M5126 Other intervertebral disc displacement, lumbar region: Secondary | ICD-10-CM | POA: Diagnosis not present

## 2018-06-24 ENCOUNTER — Encounter: Payer: BLUE CROSS/BLUE SHIELD | Admitting: Family Medicine

## 2018-06-30 ENCOUNTER — Encounter: Payer: Self-pay | Admitting: Family Medicine

## 2018-06-30 MED ORDER — PREDNISONE 10 MG PO TABS: 10.0000 mg | ORAL_TABLET | Freq: Every day | ORAL | 0 refills | Status: DC | PRN

## 2018-07-13 ENCOUNTER — Other Ambulatory Visit: Payer: Self-pay | Admitting: Sports Medicine

## 2018-07-13 DIAGNOSIS — G5603 Carpal tunnel syndrome, bilateral upper limbs: Secondary | ICD-10-CM

## 2018-07-20 ENCOUNTER — Encounter: Payer: Self-pay | Admitting: Family Medicine

## 2018-07-20 ENCOUNTER — Ambulatory Visit (INDEPENDENT_AMBULATORY_CARE_PROVIDER_SITE_OTHER): Payer: BLUE CROSS/BLUE SHIELD | Admitting: Family Medicine

## 2018-07-20 VITALS — BP 142/90 | HR 69 | Temp 98.4°F | Ht 67.5 in | Wt 259.0 lb

## 2018-07-20 DIAGNOSIS — Z23 Encounter for immunization: Secondary | ICD-10-CM

## 2018-07-20 DIAGNOSIS — Z0000 Encounter for general adult medical examination without abnormal findings: Secondary | ICD-10-CM

## 2018-07-20 NOTE — Patient Instructions (Signed)

## 2018-07-20 NOTE — Progress Notes (Signed)
Subjective:    CC: Annual Exam   HPI:  47 year old male is here today for complete physical exam.  He also has a form that needs to be completed for work.  He is doing well overall.  Though he does have a herniated disc in his lumbar spine.  He had an injection but is still having persistent numbness in his left leg.  So he is now scheduled for surgery in mid December.  He has gained some weight back as he fell off of his no carb diet and has not been able to exercise.  BP (!) 142/90   Pulse 69   Temp 98.4 F (36.9 C) (Oral)   Ht 5' 7.5" (1.715 m)   Wt 259 lb (117.5 kg)   SpO2 100%   BMI 39.97 kg/m     Allergies  Allergen Reactions  . Penicillins Anaphylaxis    Past Medical History:  Diagnosis Date  . Anxiety    anger problems  . Eosinophilic esophagitis   . Fatty liver   . History of methicillin resistant staphylococcus aureus (MRSA)   . Hypertension   . Obesity     Past Surgical History:  Procedure Laterality Date  . ESOPHAGEAL DILATION      Social History   Socioeconomic History  . Marital status: Married    Spouse name: Not on file  . Number of children: Not on file  . Years of education: Not on file  . Highest education level: Not on file  Occupational History  . Not on file  Social Needs  . Financial resource strain: Not on file  . Food insecurity:    Worry: Not on file    Inability: Not on file  . Transportation needs:    Medical: Not on file    Non-medical: Not on file  Tobacco Use  . Smoking status: Never Smoker  . Smokeless tobacco: Never Used  Substance and Sexual Activity  . Alcohol use: Yes    Comment: 6 pack of beer 2-3 x a week  . Drug use: No  . Sexual activity: Yes  Lifestyle  . Physical activity:    Days per week: Not on file    Minutes per session: Not on file  . Stress: Not on file  Relationships  . Social connections:    Talks on phone: Not on file    Gets together: Not on file    Attends religious service: Not on file   Active member of club or organization: Not on file    Attends meetings of clubs or organizations: Not on file    Relationship status: Not on file  . Intimate partner violence:    Fear of current or ex partner: Not on file    Emotionally abused: Not on file    Physically abused: Not on file    Forced sexual activity: Not on file  Other Topics Concern  . Not on file  Social History Narrative  . Not on file    Family History  Problem Relation Age of Onset  . Hypertension Mother        father  . Stroke Father     Outpatient Encounter Medications as of 07/20/2018  Medication Sig  . albuterol (PROVENTIL HFA;VENTOLIN HFA) 108 (90 Base) MCG/ACT inhaler Inhale 2 puffs into the lungs every 6 (six) hours as needed for wheezing or shortness of breath.  . Fluticasone-Salmeterol (ADVAIR) 250-50 MCG/DOSE AEPB Inhale 1 puff into the lungs 2 (two) times daily. Generic  please  . predniSONE (DELTASONE) 10 MG tablet Take 1 tablet (10 mg total) by mouth daily as needed.  . testosterone cypionate (DEPOTESTOSTERONE CYPIONATE) 200 MG/ML injection Inject 1 mL (200 mg total) into the muscle every 14 (fourteen) days.  . [DISCONTINUED] ALPRAZolam (XANAX) 0.5 MG tablet Take 1 tablet (0.5 mg total) by mouth at bedtime as needed. (Patient not taking: Reported on 07/20/2018)  . [DISCONTINUED] celecoxib (CELEBREX) 200 MG capsule TAKE 1 TO 2 TABLETS BY MOUTH DAILY AS NEEDED FOR PAIN. (Patient not taking: Reported on 07/20/2018)  . [DISCONTINUED] [employer] ASPIRIN CHILD 81 MG chewable tablet CHEW ONE TABLET (81 MG TOTAL) BY MOUTH DAILY. (Patient not taking: Reported on 07/20/2018)  . [DISCONTINUED] cyclobenzaprine (FLEXERIL) 10 MG tablet Take 1 tablet (10 mg total) by mouth at bedtime as needed for muscle spasms. (Patient not taking: Reported on 07/20/2018)  . [DISCONTINUED] gabapentin (NEURONTIN) 300 MG capsule TAKE ONE TABLET BY MOUT DAILY (Patient not taking: Reported on 07/20/2018)  . [DISCONTINUED] meloxicam (MOBIC) 15 MG  tablet One tab PO qAM with breakfast for 2 weeks, then daily prn pain. (Patient not taking: Reported on 07/20/2018)   No facility-administered encounter medications on file as of 07/20/2018.         Objective:    Physical Exam  Constitutional: He is oriented to person, place, and time. He appears well-developed and well-nourished.  HENT:  Head: Normocephalic and atraumatic.  Right Ear: External ear normal.  Left Ear: External ear normal.  Nose: Nose normal.  Mouth/Throat: Oropharynx is clear and moist.  Eyes: Pupils are equal, round, and reactive to light. Conjunctivae and EOM are normal.  Neck: Normal range of motion. Neck supple. No thyromegaly present.  Cardiovascular: Normal rate, regular rhythm, normal heart sounds and intact distal pulses.  No carotid bruits.  Pulmonary/Chest: Effort normal and breath sounds normal.  Abdominal: Soft. Bowel sounds are normal. He exhibits no distension and no mass. There is no tenderness. There is no rebound and no guarding.  Musculoskeletal: Normal range of motion.  Lymphadenopathy:    He has no cervical adenopathy.  Neurological: He is alert and oriented to person, place, and time. He has normal reflexes.  Skin: Skin is warm and dry.  Psychiatric: He has a normal mood and affect. His behavior is normal. Judgment and thought content normal.     Impression and Recommendations:    CPE Keep up a regular exercise program and make sure you are eating a healthy diet Try to eat 4 servings of dairy a day, or if you are lactose intolerant take a calcium with vitamin D daily.  Your vaccines are up to date.  She does really well with a low-carb diet so just encouraged him to get back on track with that.  He is on testosterone replacement therapy so he needs a CBC and a PSA.  Normally we will go ahead and check a testosterone level as well but it is already 4:30 in the afternoon so it will not be accurate so we may have to check that at the next  visit. Did ask him to go for labs to check PSA and hgb.   Health Maintenance  Topic Date Due  . TETANUS/TDAP  07/20/2028  . INFLUENZA VACCINE  Completed  . HIV Screening  Completed   Orders Placed This Encounter  Procedures  . Tdap vaccine greater than or equal to 7yo IM

## 2018-07-29 DIAGNOSIS — R7303 Prediabetes: Secondary | ICD-10-CM | POA: Diagnosis not present

## 2018-07-29 DIAGNOSIS — M5117 Intervertebral disc disorders with radiculopathy, lumbosacral region: Secondary | ICD-10-CM | POA: Diagnosis not present

## 2018-08-05 DIAGNOSIS — M5117 Intervertebral disc disorders with radiculopathy, lumbosacral region: Secondary | ICD-10-CM | POA: Diagnosis not present

## 2018-08-05 DIAGNOSIS — R7303 Prediabetes: Secondary | ICD-10-CM | POA: Diagnosis not present

## 2018-08-05 DIAGNOSIS — M5127 Other intervertebral disc displacement, lumbosacral region: Secondary | ICD-10-CM | POA: Diagnosis not present

## 2018-09-08 ENCOUNTER — Ambulatory Visit (INDEPENDENT_AMBULATORY_CARE_PROVIDER_SITE_OTHER): Payer: BLUE CROSS/BLUE SHIELD | Admitting: Osteopathic Medicine

## 2018-09-08 ENCOUNTER — Encounter: Payer: Self-pay | Admitting: Osteopathic Medicine

## 2018-09-08 VITALS — BP 151/90 | HR 78 | Temp 98.1°F | Wt 261.6 lb

## 2018-09-08 DIAGNOSIS — J0100 Acute maxillary sinusitis, unspecified: Secondary | ICD-10-CM

## 2018-09-08 DIAGNOSIS — E291 Testicular hypofunction: Secondary | ICD-10-CM | POA: Diagnosis not present

## 2018-09-08 DIAGNOSIS — J45909 Unspecified asthma, uncomplicated: Secondary | ICD-10-CM

## 2018-09-08 DIAGNOSIS — R7309 Other abnormal glucose: Secondary | ICD-10-CM | POA: Diagnosis not present

## 2018-09-08 MED ORDER — IPRATROPIUM BROMIDE 0.06 % NA SOLN: 2.0000 | Freq: Four times a day (QID) | NASAL | 1 refills | Status: DC

## 2018-09-08 MED ORDER — DOXYCYCLINE HYCLATE 100 MG PO TABS: 100.0000 mg | ORAL_TABLET | Freq: Two times a day (BID) | ORAL | 0 refills | Status: DC

## 2018-09-08 NOTE — Progress Notes (Signed)
HPI: Christian Gregory is a 48 y.o. male who  has a past medical history of Anxiety, Eosinophilic esophagitis, Fatty liver, History of methicillin resistant staphylococcus aureus (MRSA), Hypertension, and Obesity.  he presents to [facility] today, 09/08/18,  for chief complaint of: Sick - respiratory  . Context: Asthmatic (adult-onset, was following with pulm, was also seen by rheum), has had flu shot  . Location/Quality: Sinus congestion is worst symptom at this point.  . Duration: 2+ weeks . Modifying factors: Theraflu bid, inhalers, prednisone  o Prednisone 10 mg po daily . Assoc signs/symptoms: nasal congestion, cough, chest tightness    Immunization History  Administered Date(s) Administered  . Influenza,inj,Quad PF,6+ Mos 07/07/2017  . Influenza-Unspecified 06/01/2018  . Tdap 07/20/2018      Past medical, surgical, social and family history reviewed and updated as necessary.   Current medication list and allergy/intolerance information reviewed:    Current Outpatient Medications  Medication Sig Dispense Refill  . albuterol (PROVENTIL HFA;VENTOLIN HFA) 108 (90 Base) MCG/ACT inhaler Inhale 2 puffs into the lungs every 6 (six) hours as needed for wheezing or shortness of breath. 1 Inhaler 11  . Fluticasone-Salmeterol (ADVAIR) 250-50 MCG/DOSE AEPB Inhale 1 puff into the lungs 2 (two) times daily. Generic please 60 each 5  . predniSONE (DELTASONE) 10 MG tablet Take 1 tablet (10 mg total) by mouth daily as needed. 30 tablet 0  . testosterone cypionate (DEPOTESTOSTERONE CYPIONATE) 200 MG/ML injection Inject 1 mL (200 mg total) into the muscle every 14 (fourteen) days. 10 mL 1  . doxycycline (VIBRA-TABS) 100 MG tablet Take 1 tablet (100 mg total) by mouth 2 (two) times daily. 14 tablet 0  . ipratropium (ATROVENT) 0.06 % nasal spray Place 2 sprays into both nostrils 4 (four) times daily. 15 mL 1   No current facility-administered medications for  this visit.     Allergies  Allergen Reactions  . Penicillins Anaphylaxis and Swelling    REACTION: anaphalatic        Review of Systems:  Constitutional:  No  fever, no chills, +recent illness, No unintentional weight changes. No significant fatigue.   HEENT: No  headache, no vision change, no hearing change, No sore throat, +sinus pressure  Cardiac: No  chest pain, +pressure, No palpitations  Respiratory:  No  shortness of breath. +Cough  Gastrointestinal: No  abdominal pain, No  nausea, No  vomiting,  No  blood in stool, No  diarrhea  Musculoskeletal: No new myalgia/arthralgia  Skin: No  Rash  Neurologic: No  weakness, No  dizziness  Exam:  BP (!) 151/90 (BP Location: Left Arm, Patient Position: Sitting, Cuff Size: Large)   Pulse 78   Temp 98.1 F (36.7 C) (Oral)   Wt 261 lb 9.6 oz (118.7 kg)   SpO2 98%   BMI 40.37 kg/m   Constitutional: VS see above. General Appearance: alert, well-developed, well-nourished, NAD  Eyes: Normal lids and conjunctive, non-icteric sclera  Ears, Nose, Mouth, Throat: MMM, Normal external inspection ears/nares/mouth/lips/gums. TM normal bilaterally. Pharynx/tonsils no erythema, no exudate. Nasal mucosa normal.   Neck: No masses, trachea midline. No tenderness/mass appreciated. No lymphadenopathy  Respiratory: Normal respiratory effort. no wheeze, no rhonchi, no rales  Cardiovascular: S1/S2 normal, no murmur, no rub/gallop auscultated. RRR. No lower extremity edema.   Gastrointestinal: Nontender, no masses. Bowel sounds normal.  Musculoskeletal: Gait normal.   Neurological: Normal balance/coordination. No tremor.   Skin: warm, dry, intact.   Psychiatric: Normal judgment/insight. Normal mood and affect.  ASSESSMENT/PLAN: The primary encounter diagnosis was Acute non-recurrent maxillary sinusitis. A diagnosis of Uncomplicated asthma, unspecified asthma severity, unspecified whether persistent was also pertinent to this  visit.   No wheezing or SOB, pt would like to defer CXR and I think this is fine. Will treat for sinusitis, sounds like a 2nd sickening sinusitis rather than persistent asthma issue, close follow-up as needed  Meds ordered this encounter  Medications  . doxycycline (VIBRA-TABS) 100 MG tablet    Sig: Take 1 tablet (100 mg total) by mouth 2 (two) times daily.    Dispense:  14 tablet    Refill:  0  . ipratropium (ATROVENT) 0.06 % nasal spray    Sig: Place 2 sprays into both nostrils 4 (four) times daily.    Dispense:  15 mL    Refill:  1         Visit summary with medication list and pertinent instructions was printed for patient to review. All questions at time of visit were answered - patient instructed to contact office with any additional concerns or updates. ER/RTC precautions were reviewed with the patient.   Follow-up plan: Return if symptoms worsen or fail to improve.     Please note: voice recognition software was used to produce this document, and typos may escape review. Please contact Dr. Lyn Hollingshead for any needed clarifications.

## 2018-09-09 LAB — CBC
HCT: 50.5 % — ABNORMAL HIGH (ref 38.5–50.0)
Hemoglobin: 17.5 g/dL — ABNORMAL HIGH (ref 13.2–17.1)
MCH: 31.5 pg (ref 27.0–33.0)
MCHC: 34.7 g/dL (ref 32.0–36.0)
MCV: 90.8 fL (ref 80.0–100.0)
MPV: 9.8 fL (ref 7.5–12.5)
PLATELETS: 338 10*3/uL (ref 140–400)
RBC: 5.56 10*6/uL (ref 4.20–5.80)
RDW: 11.5 % (ref 11.0–15.0)
WBC: 13.1 10*3/uL — AB (ref 3.8–10.8)

## 2018-09-09 LAB — PSA: PSA: 0.6 ng/mL (ref <=4.0)

## 2018-09-09 LAB — HEMOGLOBIN A1C
EAG (MMOL/L): 8.7 (calc)
Hgb A1c MFr Bld: 7.1 % of total Hgb — ABNORMAL HIGH (ref <5.7)
MEAN PLASMA GLUCOSE: 157 (calc)

## 2018-10-04 DIAGNOSIS — Z8709 Personal history of other diseases of the respiratory system: Secondary | ICD-10-CM | POA: Diagnosis not present

## 2018-10-04 DIAGNOSIS — J4550 Severe persistent asthma, uncomplicated: Secondary | ICD-10-CM | POA: Diagnosis not present

## 2018-10-04 DIAGNOSIS — M301 Polyarteritis with lung involvement [Churg-Strauss]: Secondary | ICD-10-CM | POA: Diagnosis not present

## 2018-10-10 ENCOUNTER — Other Ambulatory Visit: Payer: Self-pay | Admitting: Sports Medicine

## 2018-10-10 DIAGNOSIS — G5603 Carpal tunnel syndrome, bilateral upper limbs: Secondary | ICD-10-CM

## 2018-11-10 ENCOUNTER — Other Ambulatory Visit: Payer: Self-pay | Admitting: Family Medicine

## 2018-11-10 NOTE — Telephone Encounter (Signed)
Will forward to provider to review.

## 2018-12-07 ENCOUNTER — Other Ambulatory Visit: Payer: Self-pay | Admitting: Osteopathic Medicine

## 2018-12-07 ENCOUNTER — Other Ambulatory Visit: Payer: Self-pay | Admitting: *Deleted

## 2018-12-14 ENCOUNTER — Other Ambulatory Visit: Payer: Self-pay | Admitting: Family Medicine

## 2019-01-14 ENCOUNTER — Encounter: Payer: Self-pay | Admitting: Sports Medicine

## 2019-01-14 ENCOUNTER — Ambulatory Visit (INDEPENDENT_AMBULATORY_CARE_PROVIDER_SITE_OTHER): Payer: BLUE CROSS/BLUE SHIELD | Admitting: Sports Medicine

## 2019-01-14 ENCOUNTER — Other Ambulatory Visit: Payer: Self-pay

## 2019-01-14 ENCOUNTER — Ambulatory Visit (INDEPENDENT_AMBULATORY_CARE_PROVIDER_SITE_OTHER): Payer: BLUE CROSS/BLUE SHIELD

## 2019-01-14 DIAGNOSIS — M19171 Post-traumatic osteoarthritis, right ankle and foot: Secondary | ICD-10-CM | POA: Diagnosis not present

## 2019-01-14 DIAGNOSIS — M25571 Pain in right ankle and joints of right foot: Secondary | ICD-10-CM | POA: Diagnosis not present

## 2019-01-14 NOTE — Progress Notes (Signed)
Subjective:    CC: Right ankle pain  HPI: This is a pleasant 48 year old male, he has a history of a right ankle/fibular ORIF for trauma, over the past several months to year she had increasing stiffness, pain in his right ankle, moderate, persistent, localized without radiation.  I reviewed the past medical history, family history, social history, surgical history, and allergies today and no changes were needed.  Please see the problem list section below in epic for further details.  Past Medical History: Past Medical History:  Diagnosis Date  . Anxiety    anger problems  . Eosinophilic esophagitis   . Fatty liver   . History of methicillin resistant staphylococcus aureus (MRSA)   . Hypertension   . Obesity    Past Surgical History: Past Surgical History:  Procedure Laterality Date  . ESOPHAGEAL DILATION     Social History: Social History   Socioeconomic History  . Marital status: Married    Spouse name: Not on file  . Number of children: Not on file  . Years of education: Not on file  . Highest education level: Not on file  Occupational History  . Not on file  Social Needs  . Financial resource strain: Not on file  . Food insecurity:    Worry: Not on file    Inability: Not on file  . Transportation needs:    Medical: Not on file    Non-medical: Not on file  Tobacco Use  . Smoking status: Never Smoker  . Smokeless tobacco: Never Used  Substance and Sexual Activity  . Alcohol use: Yes    Comment: 6 pack of beer 2-3 x a week  . Drug use: No  . Sexual activity: Yes  Lifestyle  . Physical activity:    Days per week: Not on file    Minutes per session: Not on file  . Stress: Not on file  Relationships  . Social connections:    Talks on phone: Not on file    Gets together: Not on file    Attends religious service: Not on file    Active member of club or organization: Not on file    Attends meetings of clubs or organizations: Not on file    Relationship  status: Not on file  Other Topics Concern  . Not on file  Social History Narrative  . Not on file   Family History: Family History  Problem Relation Age of Onset  . Hypertension Mother        father  . Stroke Father    Allergies: Allergies  Allergen Reactions  . Penicillins Anaphylaxis and Swelling    REACTION: anaphalatic     Medications: See med rec.  Review of Systems: No fevers, chills, night sweats, weight loss, chest pain, or shortness of breath.   Objective:    General: Well Developed, well nourished, and in no acute distress.  Neuro: Alert and oriented x3, extra-ocular muscles intact, sensation grossly intact.  HEENT: Normocephalic, atraumatic, pupils equal round reactive to light, neck supple, no masses, no lymphadenopathy, thyroid nonpalpable.  Skin: Warm and dry, no rashes. Cardiac: Regular rate and rhythm, no murmurs rubs or gallops, no lower extremity edema.  Respiratory: Clear to auscultation bilaterally. Not using accessory muscles, speaking in full sentences. Right ankle: Swollen, well-healed surgical scar Range of motion is full in all directions. Strength is 5/5 in all directions. Stable lateral and medial ligaments; squeeze test and kleiger test unremarkable; Talar dome nontender; No pain at base of  5th MT; No tenderness over cuboid; No tenderness over N spot or navicular prominence No tenderness on posterior aspects of lateral and medial malleolus No sign of peroneal tendon subluxations; Negative tarsal tunnel tinel's Able to walk 4 steps.  Procedure: Real-time Ultrasound Guided injection of the right ankle joint Device: GE Logiq E  Verbal informed consent obtained.  Time-out conducted.  Noted no overlying erythema, induration, or other signs of local infection.  Skin prepped in a sterile fashion.  Local anesthesia: Topical Ethyl chloride.  With sterile technique and under real time ultrasound guidance:  1 cc Kenalog 40, 1 cc lidocaine, 1 cc  bupivacaine injected easily Completed without difficulty  Pain immediately resolved suggesting accurate placement of the medication.  Advised to call if fevers/chills, erythema, induration, drainage, or persistent bleeding.  Images permanently stored and available for review in the ultrasound unit.  Impression: Technically successful ultrasound guided injection.  Impression and Recommendations:    Post-traumatic osteoarthritis of right ankle Injection, x-rays, rehab exercises. Return to see me in a month, referral for ankle arthrodesis versus arthroplasty if no better.   ___________________________________________ Ihor Austin. Benjamin Stain, M.D., ABFM., CAQSM. Primary Care and Sports Medicine [facility]  Adjunct Professor of Family Medicine  [employer]

## 2019-01-14 NOTE — Assessment & Plan Note (Signed)
Injection, x-rays, rehab exercises. Return to see me in a month, referral for ankle arthrodesis versus arthroplasty if no better.

## 2019-01-15 ENCOUNTER — Encounter: Payer: Self-pay | Admitting: Sports Medicine

## 2019-01-17 ENCOUNTER — Encounter: Payer: Self-pay | Admitting: Sports Medicine

## 2019-02-09 ENCOUNTER — Encounter: Payer: Self-pay | Admitting: Family Medicine

## 2019-02-09 ENCOUNTER — Other Ambulatory Visit: Payer: Self-pay | Admitting: Family Medicine

## 2019-02-09 NOTE — Telephone Encounter (Signed)
Labs in Jan showed he has diabetes and he was supposed to schedule appt. And his hemoglobin was high and was supposed to be rechecked.

## 2019-02-09 NOTE — Telephone Encounter (Signed)
Please have patient schedule appointment in regards to below.

## 2019-02-10 ENCOUNTER — Other Ambulatory Visit: Payer: Self-pay

## 2019-02-10 ENCOUNTER — Other Ambulatory Visit: Payer: Self-pay | Admitting: Sports Medicine

## 2019-02-10 ENCOUNTER — Ambulatory Visit (INDEPENDENT_AMBULATORY_CARE_PROVIDER_SITE_OTHER): Payer: BC Managed Care – PPO | Admitting: Family Medicine

## 2019-02-10 ENCOUNTER — Encounter: Payer: Self-pay | Admitting: Family Medicine

## 2019-02-10 VITALS — BP 145/82 | HR 79 | Ht 65.0 in | Wt 262.0 lb

## 2019-02-10 DIAGNOSIS — R7309 Other abnormal glucose: Secondary | ICD-10-CM | POA: Diagnosis not present

## 2019-02-10 DIAGNOSIS — K2100 Gastro-esophageal reflux disease with esophagitis, without bleeding: Secondary | ICD-10-CM

## 2019-02-10 DIAGNOSIS — D582 Other hemoglobinopathies: Secondary | ICD-10-CM | POA: Diagnosis not present

## 2019-02-10 DIAGNOSIS — E099 Drug or chemical induced diabetes mellitus without complications: Secondary | ICD-10-CM

## 2019-02-10 DIAGNOSIS — I7789 Other specified disorders of arteries and arterioles: Secondary | ICD-10-CM

## 2019-02-10 DIAGNOSIS — T380X5A Adverse effect of glucocorticoids and synthetic analogues, initial encounter: Secondary | ICD-10-CM

## 2019-02-10 DIAGNOSIS — I10 Essential (primary) hypertension: Secondary | ICD-10-CM

## 2019-02-10 DIAGNOSIS — I776 Arteritis, unspecified: Secondary | ICD-10-CM

## 2019-02-10 DIAGNOSIS — E291 Testicular hypofunction: Secondary | ICD-10-CM | POA: Diagnosis not present

## 2019-02-10 DIAGNOSIS — R41840 Attention and concentration deficit: Secondary | ICD-10-CM

## 2019-02-10 DIAGNOSIS — G5603 Carpal tunnel syndrome, bilateral upper limbs: Secondary | ICD-10-CM

## 2019-02-10 LAB — POCT GLYCOSYLATED HEMOGLOBIN (HGB A1C): Hemoglobin A1C: 7.6 % — AB (ref 4.0–5.6)

## 2019-02-10 MED ORDER — OZEMPIC (0.25 OR 0.5 MG/DOSE) 2 MG/1.5ML ~~LOC~~ SOPN: PEN_INJECTOR | SUBCUTANEOUS | 1 refills | Status: AC

## 2019-02-10 MED ORDER — OMEPRAZOLE 40 MG PO CPDR: 40.0000 mg | DELAYED_RELEASE_CAPSULE | Freq: Every day | ORAL | 1 refills | Status: DC

## 2019-02-10 MED ORDER — BLOOD GLUCOSE MONITOR KIT: PACK | 0 refills | Status: DC

## 2019-02-10 MED ORDER — INSULIN PEN NEEDLE 32G X 4 MM MISC: 99 refills | Status: DC

## 2019-02-10 NOTE — Assessment & Plan Note (Signed)
Blood pressure is better than what it was while he was on steroids but still high.  He does have access to a blood pressure cuff and will try to get that to be able to follow his blood pressure at home over the next 4 to 6 weeks to see if it continues to trend down.  If not then we will need to start him on blood pressure medication even if it is for short-term.  I explained that elevated blood pressure and weight gain is extremely common with chronic prednisone use.

## 2019-02-10 NOTE — Assessment & Plan Note (Signed)
Check a CBC to recheck his hemoglobin levels.  We will go ahead and refill his testosterone.

## 2019-02-10 NOTE — Assessment & Plan Note (Signed)
Now on Nucala and off of prednisone.

## 2019-02-10 NOTE — Patient Instructions (Signed)

## 2019-02-10 NOTE — Progress Notes (Addendum)
Established Patient Office Visit  Subjective:  Patient ID: Christian Gregory, male    DOB: 17-Oct-1970  Age: 48 y.o. MRN: [id number]  CC:  Chief Complaint  Patient presents with  . Hypertension  . abnormal glucose    HPI Christian Gregory presents for  Hypertension- Pt denies chest pain, SOB, dizziness, or heart palpitations.  Taking meds as directed w/o problems.  Denies medication side effects.   Follow-up hypogonadism-he is doing well on his testosterone therapy.  Initially had sent in a refill request and realize that he actually needed to come in for a visit.  He is doing well on his regimen.  He does need refills but has a little bit left.  He knows he is due for a little bit of blood work as well.  The last time we checked his hemoglobin in January was little elevated which can be a side effect of testosterone that he was also on prednisone at the time so I would like to recheck that.  Diabetes - no hypoglycemic events. No wounds or sores that are not healing well. No increased thirst or urination. Checking glucose at home. Taking medications as prescribed without any side effects.  Just feels like he is really been struggling emotionally.  He has been having a difficult time at home.  His wife has bipolar disorder and she has not been able to stay on her medication that works well for her because of insurance coverage.  They evidently have a $9000 deductible.  He just feels like he is really struggling with his weight which is no surprise because of the long-term prednisone that he has been on.  Eosinophilic vasculitis he is now on Nucala and actually came off his prednisone on his own about 2 weeks ago.  I believe he has another follow-up in about another month or 2.  Also complains of some mid chest burning that has been going on for about 2 to 3 months.  He does drink about 1 soda a day and maybe a green tea but says he really does try to avoid spicy foods.  He is also been on prednisone  without any type of GI prophylaxis.  Lab Results  Component Value Date   HGBA1C 7.6 (A) 02/10/2019     Past Medical History:  Diagnosis Date  . Anxiety    anger problems  . Eosinophilic esophagitis   . Fatty liver   . History of methicillin resistant staphylococcus aureus (MRSA)   . Hypertension   . Obesity     Past Surgical History:  Procedure Laterality Date  . ESOPHAGEAL DILATION      Family History  Problem Relation Age of Onset  . Hypertension Mother        father  . Stroke Father     Social History   Socioeconomic History  . Marital status: Married    Spouse name: Not on file  . Number of children: Not on file  . Years of education: Not on file  . Highest education level: Not on file  Occupational History  . Not on file  Social Needs  . Financial resource strain: Not on file  . Food insecurity    Worry: Not on file    Inability: Not on file  . Transportation needs    Medical: Not on file    Non-medical: Not on file  Tobacco Use  . Smoking status: Never Smoker  . Smokeless tobacco: Never Used  Substance and Sexual  Activity  . Alcohol use: Yes    Comment: 6 pack of beer 2-3 x a week  . Drug use: No  . Sexual activity: Yes  Lifestyle  . Physical activity    Days per week: Not on file    Minutes per session: Not on file  . Stress: Not on file  Relationships  . Social Herbalist on phone: Not on file    Gets together: Not on file    Attends religious service: Not on file    Active member of club or organization: Not on file    Attends meetings of clubs or organizations: Not on file    Relationship status: Not on file  . Intimate partner violence    Fear of current or ex partner: Not on file    Emotionally abused: Not on file    Physically abused: Not on file    Forced sexual activity: Not on file  Other Topics Concern  . Not on file  Social History Narrative  . Not on file    Outpatient Medications Prior to Visit  Medication  Sig Dispense Refill  . albuterol (PROVENTIL HFA;VENTOLIN HFA) 108 (90 Base) MCG/ACT inhaler Inhale 2 puffs into the lungs every 6 (six) hours as needed for wheezing or shortness of breath. 1 Inhaler 11  . DULERA 200-5 MCG/ACT AERO Inhale 2 puffs into the lungs 2 (two) times a day.    . gabapentin (NEURONTIN) 300 MG capsule TAKE ONE CAPSULE BY MOUTH DAILY 90 capsule 2  . NUCALA 100 MG/ML SOAJ     . predniSONE (DELTASONE) 10 MG tablet Take 10 mg by mouth daily.    Marland Kitchen SPIRIVA RESPIMAT 2.5 MCG/ACT AERS Inhale 1 puff into the lungs daily.    Marland Kitchen testosterone cypionate (DEPOTESTOSTERONE CYPIONATE) 200 MG/ML injection INJECT 1 ML (200 MG TOTAL) INTO THE MUSCLE EVERY 14 (FOURTEEN) DAYS. 6 mL 0  . celecoxib (CELEBREX) 200 MG capsule TAKE 1 TO 2 TABLETS BY MOUTH DAILY AS NEEDED FOR PAIN. 180 capsule 0  . Fluticasone-Salmeterol (ADVAIR) 250-50 MCG/DOSE AEPB Inhale 1 puff into the lungs 2 (two) times daily. Generic please 60 each 5  . ipratropium (ATROVENT) 0.06 % nasal spray PLACE 2 SPRAYS INTO BOTH NOSTRILS 4 (FOUR) TIMES DAILY. 45 mL 1   No facility-administered medications prior to visit.     Allergies  Allergen Reactions  . Penicillins Anaphylaxis and Swelling    REACTION: anaphalatic      ROS Review of Systems    Objective:    Physical Exam  Constitutional: He is oriented to person, place, and time. He appears well-developed and well-nourished.  HENT:  Head: Normocephalic and atraumatic.  Cardiovascular: Normal rate, regular rhythm and normal heart sounds.  Pulmonary/Chest: Effort normal and breath sounds normal.  Neurological: He is alert and oriented to person, place, and time.  Skin: Skin is warm and dry.  Psychiatric: He has a normal mood and affect. His behavior is normal.    BP (!) 145/82   Pulse 79   Ht 5' 5"  (1.651 m)   Wt 262 lb (118.8 kg)   SpO2 97%   BMI 43.60 kg/m  Wt Readings from Last 3 Encounters:  02/10/19 262 lb (118.8 kg)  01/14/19 262 lb (118.8 kg)   09/08/18 261 lb 9.6 oz (118.7 kg)     There are no preventive care reminders to display for this patient.  There are no preventive care reminders to display for this patient.  Lab  Results  Component Value Date   TSH 2.542 09/02/2012   Lab Results  Component Value Date   WBC 13.1 (H) 09/08/2018   HGB 17.5 (H) 09/08/2018   HCT 50.5 (H) 09/08/2018   MCV 90.8 09/08/2018   PLT 338 09/08/2018   Lab Results  Component Value Date   NA 139 02/04/2018   K 4.5 02/04/2018   CO2 27 02/04/2018   GLUCOSE 140 (H) 02/04/2018   BUN 22 02/04/2018   CREATININE 0.94 02/04/2018   BILITOT 1.0 02/04/2018   ALKPHOS 58 01/05/2017   AST 14 02/04/2018   ALT 25 02/04/2018   PROT 7.3 02/04/2018   ALBUMIN 4.4 01/05/2017   CALCIUM 9.9 02/04/2018   Lab Results  Component Value Date   CHOL 202 (H) 02/04/2018   Lab Results  Component Value Date   HDL 69 02/04/2018   Lab Results  Component Value Date   LDLCALC 115 (H) 02/04/2018   Lab Results  Component Value Date   TRIG 79 02/04/2018   Lab Results  Component Value Date   CHOLHDL 2.9 02/04/2018   Lab Results  Component Value Date   HGBA1C 7.6 (A) 02/10/2019      Assessment & Plan:   Problem List Items Addressed This Visit      Cardiovascular and Mediastinum   ESSENTIAL HYPERTENSION, BENIGN    Blood pressure is better than what it was while he was on steroids but still high.  He does have access to a blood pressure cuff and will try to get that to be able to follow his blood pressure at home over the next 4 to 6 weeks to see if it continues to trend down.  If not then we will need to start him on blood pressure medication even if it is for short-term.  I explained that elevated blood pressure and weight gain is extremely common with chronic prednisone use.      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[place])    Now on Nucala and off of prednisone.        Endocrine   Steroid-induced diabetes ([place])    Diagnosis of steroid-induced diabetes-we  had done some blood work back in January and indicated that at that time his A1c was elevated at 7.2.  He is back today for follow-up.  It is now 7.8.  Will send over new prescription for glucometer.  We discussed treatment options including metformin and or 1 of the newer medications such as Victoza or Ozempic which could help with weight loss in addition to lowering his A1c.  He preferred to try the latter and so was sent over prescription for Ozempic which looks like it is covered on his insurance plan.      Relevant Medications   Semaglutide,0.25 or 0.5MG/DOS, (OZEMPIC, 0.25 OR 0.5 MG/DOSE,) 2 MG/1.5ML SOPN   blood glucose meter kit and supplies KIT   Hypogonadism male    Check a CBC to recheck his hemoglobin levels.  We will go ahead and refill his testosterone.       Other Visit Diagnoses    Abnormal glucose    -  Primary   Relevant Orders   POCT glycosylated hemoglobin (Hb A1C) (Completed)   Hypogonadism in male       Relevant Orders   CBC   Elevated hemoglobin (HCC)       Relevant Orders   CBC   Gastroesophageal reflux disease with esophagitis       Relevant Medications   omeprazole (PRILOSEC) 40 MG  capsule   Inattention       Relevant Orders   Ambulatory referral to [facility]     GERD/gastritis-suspect that he has GERD probably from long-term use of prednisone without being on prophylaxis.  Given some additional information about reflux and will give him a trial on omeprazole for the next few weeks to see if he is improving.  Evelated hemoglobin-due to recheck levels.  Inattention- screening score of 57. Positive screen for ADD. Will refer to [facility] for adult ADD evaluation  Meds ordered this encounter  Medications  . Semaglutide,0.25 or 0.5MG/DOS, (OZEMPIC, 0.25 OR 0.5 MG/DOSE,) 2 MG/1.5ML SOPN    Sig: Inject 0.25 mg into the skin once a week for 28 days, THEN 0.5 mg once a week for 28 days.    Dispense:  2 pen    Refill:  1  . omeprazole (PRILOSEC) 40 MG  capsule    Sig: Take 1 capsule (40 mg total) by mouth daily.    Dispense:  30 capsule    Refill:  1  . Insulin Pen Needle 32G X 4 MM MISC    Sig: Use to inject into the skin daily    Dispense:  100 each    Refill:  prn  . blood glucose meter kit and supplies KIT    Sig: Dispense based on patient and insurance preference. Use up to four times daily as directed. (FOR ICD-9 250.00, 250.01).    Dispense:  1 each    Refill:  0    Order Specific Question:   Number of strips    Answer:   100    Order Specific Question:   Number of lancets    Answer:   100    Follow-up: Return in about 6 weeks (around 03/24/2019) for recheck new medication .    Beatrice Lecher, MD

## 2019-02-10 NOTE — Assessment & Plan Note (Signed)
Diagnosis of steroid-induced diabetes-we had done some blood work back in January and indicated that at that time his A1c was elevated at 7.2.  He is back today for follow-up.  It is now 7.8.  Will send over new prescription for glucometer.  We discussed treatment options including metformin and or 1 of the newer medications such as Victoza or Ozempic which could help with weight loss in addition to lowering his A1c.  He preferred to try the latter and so was sent over prescription for Ozempic which looks like it is covered on his insurance plan.

## 2019-02-11 NOTE — Addendum Note (Signed)
Addended by: Beatrice Lecher D on: 02/11/2019 04:41 PM   Modules accepted: Orders

## 2019-02-16 ENCOUNTER — Telehealth: Payer: Self-pay

## 2019-02-16 NOTE — Telephone Encounter (Signed)
Patient called stating he was supposed to start some injections but there were some issues- wanting to know if they have been "figured out"  [place], I imagine the Ozempic that was sent to pharmacy on 02/10/19 needed a prior auth, do you know anything about this?

## 2019-02-21 NOTE — Telephone Encounter (Signed)
Message from Plan No PPA required, Pharmacy is aware and will call back if there are any concerns.

## 2019-02-21 NOTE — Telephone Encounter (Signed)
Information has been sent to insurance and waiting on a response.   

## 2019-02-23 DIAGNOSIS — N50812 Left testicular pain: Secondary | ICD-10-CM | POA: Diagnosis not present

## 2019-02-23 DIAGNOSIS — N3289 Other specified disorders of bladder: Secondary | ICD-10-CM | POA: Diagnosis not present

## 2019-02-23 DIAGNOSIS — K4090 Unilateral inguinal hernia, without obstruction or gangrene, not specified as recurrent: Secondary | ICD-10-CM | POA: Diagnosis not present

## 2019-02-23 DIAGNOSIS — Z880 Allergy status to penicillin: Secondary | ICD-10-CM | POA: Diagnosis not present

## 2019-02-23 DIAGNOSIS — K429 Umbilical hernia without obstruction or gangrene: Secondary | ICD-10-CM | POA: Diagnosis not present

## 2019-02-23 DIAGNOSIS — N451 Epididymitis: Secondary | ICD-10-CM | POA: Diagnosis not present

## 2019-02-23 DIAGNOSIS — Z79899 Other long term (current) drug therapy: Secondary | ICD-10-CM | POA: Diagnosis not present

## 2019-02-23 DIAGNOSIS — N132 Hydronephrosis with renal and ureteral calculous obstruction: Secondary | ICD-10-CM | POA: Diagnosis not present

## 2019-02-23 DIAGNOSIS — Z79890 Hormone replacement therapy (postmenopausal): Secondary | ICD-10-CM | POA: Diagnosis not present

## 2019-02-23 DIAGNOSIS — I10 Essential (primary) hypertension: Secondary | ICD-10-CM | POA: Diagnosis not present

## 2019-02-23 DIAGNOSIS — N23 Unspecified renal colic: Secondary | ICD-10-CM | POA: Diagnosis not present

## 2019-02-23 DIAGNOSIS — I861 Scrotal varices: Secondary | ICD-10-CM | POA: Diagnosis not present

## 2019-02-24 ENCOUNTER — Encounter: Payer: Self-pay | Admitting: Family Medicine

## 2019-02-24 DIAGNOSIS — N50812 Left testicular pain: Secondary | ICD-10-CM | POA: Diagnosis not present

## 2019-02-24 DIAGNOSIS — I861 Scrotal varices: Secondary | ICD-10-CM | POA: Diagnosis not present

## 2019-02-24 DIAGNOSIS — K429 Umbilical hernia without obstruction or gangrene: Secondary | ICD-10-CM | POA: Diagnosis not present

## 2019-02-24 DIAGNOSIS — N132 Hydronephrosis with renal and ureteral calculous obstruction: Secondary | ICD-10-CM | POA: Diagnosis not present

## 2019-02-24 DIAGNOSIS — K4090 Unilateral inguinal hernia, without obstruction or gangrene, not specified as recurrent: Secondary | ICD-10-CM | POA: Diagnosis not present

## 2019-02-24 DIAGNOSIS — N3289 Other specified disorders of bladder: Secondary | ICD-10-CM | POA: Diagnosis not present

## 2019-02-25 ENCOUNTER — Telehealth (INDEPENDENT_AMBULATORY_CARE_PROVIDER_SITE_OTHER): Payer: BC Managed Care – PPO | Admitting: Family Medicine

## 2019-02-25 ENCOUNTER — Encounter: Payer: Self-pay | Admitting: Family Medicine

## 2019-02-25 ENCOUNTER — Telehealth: Payer: Self-pay

## 2019-02-25 DIAGNOSIS — R109 Unspecified abdominal pain: Secondary | ICD-10-CM | POA: Diagnosis not present

## 2019-02-25 DIAGNOSIS — Z20828 Contact with and (suspected) exposure to other viral communicable diseases: Secondary | ICD-10-CM | POA: Diagnosis not present

## 2019-02-25 DIAGNOSIS — Z466 Encounter for fitting and adjustment of urinary device: Secondary | ICD-10-CM | POA: Diagnosis not present

## 2019-02-25 DIAGNOSIS — Z880 Allergy status to penicillin: Secondary | ICD-10-CM | POA: Diagnosis not present

## 2019-02-25 DIAGNOSIS — Z79899 Other long term (current) drug therapy: Secondary | ICD-10-CM | POA: Diagnosis not present

## 2019-02-25 DIAGNOSIS — Z8249 Family history of ischemic heart disease and other diseases of the circulatory system: Secondary | ICD-10-CM | POA: Diagnosis not present

## 2019-02-25 DIAGNOSIS — N1330 Unspecified hydronephrosis: Secondary | ICD-10-CM | POA: Diagnosis not present

## 2019-02-25 DIAGNOSIS — Z825 Family history of asthma and other chronic lower respiratory diseases: Secondary | ICD-10-CM | POA: Diagnosis not present

## 2019-02-25 DIAGNOSIS — I10 Essential (primary) hypertension: Secondary | ICD-10-CM | POA: Diagnosis not present

## 2019-02-25 DIAGNOSIS — Z8261 Family history of arthritis: Secondary | ICD-10-CM | POA: Diagnosis not present

## 2019-02-25 DIAGNOSIS — N200 Calculus of kidney: Secondary | ICD-10-CM | POA: Diagnosis not present

## 2019-02-25 DIAGNOSIS — Z841 Family history of disorders of kidney and ureter: Secondary | ICD-10-CM | POA: Diagnosis not present

## 2019-02-25 DIAGNOSIS — Z8673 Personal history of transient ischemic attack (TIA), and cerebral infarction without residual deficits: Secondary | ICD-10-CM | POA: Diagnosis not present

## 2019-02-25 DIAGNOSIS — Z823 Family history of stroke: Secondary | ICD-10-CM | POA: Diagnosis not present

## 2019-02-25 DIAGNOSIS — N201 Calculus of ureter: Secondary | ICD-10-CM | POA: Diagnosis not present

## 2019-02-25 MED ORDER — PROMETHAZINE HCL 25 MG PO TABS: 25.0000 mg | ORAL_TABLET | Freq: Three times a day (TID) | ORAL | 0 refills | Status: DC | PRN

## 2019-02-25 MED ORDER — HYDROCODONE-ACETAMINOPHEN 5-325 MG PO TABS: 1.0000 | ORAL_TABLET | ORAL | 0 refills | Status: DC | PRN

## 2019-02-25 NOTE — Progress Notes (Signed)
Pt reports that he was seen at the ED on   6 hydrocodone and he took his last one 30 mins ago and he stated that he is waiting for someone from the urologist to call back and he hasn't heard back from them as of yet. He was told that they were going to call him back ASAP.   He is experiencing sweats and chills.Maryruth Eve, [place] Crocker, CMA

## 2019-02-25 NOTE — Telephone Encounter (Signed)
Jenny Reichmann, can you check on patient's urology referral.  We did not place a referral it was done through the emergency department but I just want a make sure that they got all the information that they needed.  They had referred him to [facility] with Dr. Betsy Coder on [address] in [place].  I did a follow-up video visit with him this morning and he has not heard back from their office yet.

## 2019-02-25 NOTE — Telephone Encounter (Signed)
[place] office,  Please contact patient to schedule a hospital follow up with Dr Madilyn Fireman this afternoon.   Thanks Levada Dy  Result Impression  IMPRESSION:  1. Mild left hydronephrosis, secondary to an obstructing 6 mm calculus of the proximal left ureter, with prominent left perinephric stranding.  2. Mild diffuse urinary bladder wall thickening which may represent a cystitis. 3. Normal appendix. 4. 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   Electronically Signed by: Zettie Pho  Result Narrative   INDICATION: CVAt  TECHNIQUE: CT ABDOMEN PELVIS WITHOUT IV CONTRAST  Dose reduction was utilized (automated exposure control, mA or kV adjustment based on patient size, or iterative image reconstruction).   FINDINGS:  CT ABDOMEN PELVIS   Lower Chest: Mild dependent atelectasis  Liver: Mild diffuse steatosis. Mild hepatomegaly.  Gallbladder and Biliary Tree: Unremarkable  Pancreas: Unremarkable  Spleen: Unremarkable  Adrenals: Bilateral adrenal gland coarse calcifications  Kidneys and Urinary Bladder: Mild left hydronephrosis, secondary to an obstructing 6 mm calculus of the proximal left ureter, with prominent left perinephric stranding. Unremarkable right kidney. Mild diffuse urinary bladder wall thickening.  Reproductive: Unremarkable  Abdominal Aorta: Mild atherosclerotic disease  Lymph Nodes: Unremarkable  Peritoneum: No free fluid. No free intraperitoneal air.  Gastrointestinal Tract: Small hiatal hernia. Negative for bowel obstruction. Normal appendix.  Abdominal and Pelvic Walls: Small fat-containing bilateral inguinal hernias. Small fat-containing umbilical hernia.  Bones: No acute or destructive osseous process. 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  Other Result Information  Acute Interface, Incoming Rad Results - 02/24/2019  1:24 AM EDT  INDICATION: CVAt  TECHNIQUE: CT ABDOMEN PELVIS WITHOUT IV CONTRAST  Dose reduction was utilized  (automated exposure control, mA or kV adjustment based on patient size, or iterative image reconstruction).   FINDINGS:  CT ABDOMEN PELVIS   Lower Chest: Mild dependent atelectasis  Liver: Mild diffuse steatosis.  Mild hepatomegaly.  Gallbladder and Biliary Tree: Unremarkable  Pancreas: Unremarkable  Spleen: Unremarkable  Adrenals: Bilateral adrenal gland coarse calcifications  Kidneys and Urinary Bladder: Mild left hydronephrosis, secondary to an obstructing 6 mm calculus of the proximal left ureter, with prominent left perinephric stranding.  Unremarkable right kidney.  Mild diffuse urinary bladder wall thickening.  Reproductive: Unremarkable  Abdominal Aorta: Mild atherosclerotic disease  Lymph Nodes: Unremarkable  Peritoneum: No free fluid. No free intraperitoneal air.  Gastrointestinal Tract: Small hiatal hernia.  Negative for bowel obstruction.  Normal appendix.  Abdominal and Pelvic Walls: Small fat-containing bilateral inguinal hernias.  Small fat-containing umbilical hernia.  Bones: No acute or destructive osseous process.  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    IMPRESSION:  1.  Mild left hydronephrosis, secondary to an obstructing 6 mm calculus of the proximal left ureter, with prominent left perinephric stranding.   2.  Mild diffuse urinary bladder wall thickening which may represent a cystitis. 3.  Normal appendix. 4.  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   Electronically Signed by: Zettie Pho

## 2019-02-25 NOTE — Progress Notes (Signed)
Virtual Visit via Video Note  I connected with Christian Gregory on 02/25/19 at  9:00 AM EDT by a video enabled telemedicine application and verified that I am speaking with the correct person using two identifiers.   I discussed the limitations of evaluation and management by telemedicine and the availability of in person appointments. The patient expressed understanding and agreed to proceed.  Pt was at home and I was in my office for the virtual visit.     Subjective:    CC: Kidney stones  HPI: 48 year old male is connecting via video visit in regards to pain from kidney stones.  He says Wednesday he was traveling with his son and had stopped to get something to eat and right after starting to drink a soda started to have some abdominal pain.  A just continue to increase in intensity to the point that he had to get his wife to drive.  Finally that evening he just went to the emergency department he says the pain was unbearable.  He was starting to vomit.  He went to the emergency room and had a CAT scan which showed a kidney stone on the left side with some perinephric stranding as well as some thickening of the bladder wall.  He was diagnosed with kidney stone and possible epididymitis.  He was placed on doxycycline as well as given a small quantity of oxycodone and was referred to urology.  He says he was hoping that he would have heard from them today but he has not.  He is continuing to be in pain and says he just took his last pain pill this morning and he is worried about getting through the weekend without some type of pain control.  He still feeling nauseated but is no longer vomiting.  He says he felt like he may have run a low-grade fever but is not sure.  Temperature today was 98.6.  He is not sure if there is any blood in the urine but says it has looked much darker than usual.   2020 July CT Abdomen Pelvis WO IV Contrast Larina Bras(Stone Protocol)02/24/2019 [facility] Result Impression   IMPRESSION:  1. Mild left hydronephrosis, secondary to an obstructing 6 mm calculus of the proximal left ureter, with prominent left perinephric stranding.  2. Mild diffuse urinary bladder wall thickening which may represent a cystitis. 3. Normal appendix. 4. 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     Past medical history, Surgical history, Family history not pertinant except as noted below, Social history, Allergies, and medications have been entered into the medical record, reviewed, and corrections made.   Review of Systems: No fevers, chills, night sweats, weight loss, chest pain, or shortness of breath.   Objective:    General: Speaking clearly in complete sentences without any shortness of breath.  Alert and oriented x3.  Normal judgment. No apparent acute distress.    Impression and Recommendations:   Left-sided kidney stone-with some inflammation and perinephric stranding.  Asked her to complete the doxycycline.  I did send over 5 days worth of the hydrocodone to the pharmacy also sent over a little bit of Phenergan to use as needed for nausea.  Make sure to continue the Flomax as well.  We will try to get in touch with urology to make sure that they have received his referral and that they are working on getting him in.  Call if symptoms are getting worse or go to the emergency department if his pain becomes  uncontrolled.  Reminded him that we do have an on-call nurse over the weekend if needed.       I discussed the assessment and treatment plan with the patient. The patient was provided an opportunity to ask questions and all were answered. The patient agreed with the plan and demonstrated an understanding of the instructions.   The patient was advised to call back or seek an in-person evaluation if the symptoms worsen or if the condition fails to improve as anticipated.   Beatrice Lecher, MD

## 2019-02-28 ENCOUNTER — Other Ambulatory Visit: Payer: Self-pay | Admitting: Family Medicine

## 2019-02-28 DIAGNOSIS — N201 Calculus of ureter: Secondary | ICD-10-CM | POA: Diagnosis not present

## 2019-03-01 NOTE — Telephone Encounter (Signed)
I called [facility] at the moment their system is down. They took all information and will call me back to let me know if they have all information needed and if patient is scheduled - CF

## 2019-03-04 ENCOUNTER — Telehealth: Payer: Self-pay | Admitting: Family Medicine

## 2019-03-04 DIAGNOSIS — R0683 Snoring: Secondary | ICD-10-CM

## 2019-03-04 DIAGNOSIS — R41840 Attention and concentration deficit: Secondary | ICD-10-CM

## 2019-03-04 NOTE — Telephone Encounter (Signed)
Please call patient and let him know that I saw some notes from anesthesia where they were questioning whether or not he was having some breathing issues.  I would like to do a screen for sleep apnea which is a stop bang questionnaire we should be able to do that over the phone if he is able to measure his neck and get that measurement for [place].  I cannot remember if he never been evaluated for this before or not but I do think we should at least investigate it.  It can lead to difficulty losing weight as well as feeling tired and fatigued and abnormal weight gain if he does have it and we are not treating it.

## 2019-03-04 NOTE — Telephone Encounter (Signed)
Called patient to advise but was unable to leave a message due to mailbox being full. Will try calling  back later. Kayshawn Ozburn,CMA

## 2019-03-09 DIAGNOSIS — Z466 Encounter for fitting and adjustment of urinary device: Secondary | ICD-10-CM | POA: Diagnosis not present

## 2019-03-09 DIAGNOSIS — N200 Calculus of kidney: Secondary | ICD-10-CM | POA: Diagnosis not present

## 2019-03-09 NOTE — Telephone Encounter (Signed)
Okay, we will go ahead and order home sleep study based on his questionnaire screening.  It can be done in the home and will help determine whether or not he truly has sleep apnea or not.  In regards to the Opal I can send that forwarded to [facility] to check to see if it requires prior authorization.  And we can definitely work on getting adult ADD testing scheduled somewhere else.  It sounds like he may have left a message but this is the first time hearing about it so we will definitely work on that.

## 2019-03-09 NOTE — Telephone Encounter (Signed)
Called pt and did stop bang over the phone. He is High risk for OSA. Will fwd to pcp.  Pt also wanted Dr. Madilyn Fireman to know that the Ozempic was not filled. I told him that we would likely have to do a PA for this.  Lastly he informed me that the referral to have the ADD/ADHD testing could not be done until October due to the person who does the test is out on FMLA. He said that he called and advised our office of this. He wanted to know if he could go somewhere else to have this done.   Marland KitchenMarland KitchenMarland KitchenElouise Munroe, [place]

## 2019-03-11 NOTE — Telephone Encounter (Signed)
Per the previous note the Ozempic did not need a PA. I have called the pharmacy again to let them know. I have also called the patient and he is going to check again to see if the pharmacy will let him pick up the medication.

## 2019-03-16 NOTE — Telephone Encounter (Signed)
Barnet Pall,  The Ozempic doesn't require a PA. However, it is a non covered medication. Could you do a letter for Ozempic to be on the covered list.     To Whom It May Concern:   I am writing on behalf of my patient, Christian Gregory to document the medical necessity of Ozempic for the treatment of Diabetes. Please have the Ozempic medication placed on the covered list of medications. Otherwise it would be a financial hardship for the patient.    Duration:  99 months  In summary, Ozempic is medically necessary for this patient's medical condition. Please contact me if any additional information is required to ensure the prompt approval of Ozempic.   Sincerely,    Beatrice Lecher, MD

## 2019-03-17 ENCOUNTER — Encounter: Payer: Self-pay | Admitting: Family Medicine

## 2019-03-18 ENCOUNTER — Ambulatory Visit: Payer: BC Managed Care – PPO | Admitting: Family Medicine

## 2019-03-18 MED ORDER — TRULICITY 0.75 MG/0.5ML ~~LOC~~ SOAJ: 0.7500 mg | SUBCUTANEOUS | 1 refills | Status: DC

## 2019-03-22 NOTE — Telephone Encounter (Signed)
I called [facility] and the Trulicity is covered at no co-pay for the patient. He is going to pick up the medicaiton and he will call back for a follow up. No other inquires during the call.

## 2019-03-31 NOTE — Telephone Encounter (Signed)
I called [facility] again today since they never called me back and Christian Gregory has been seen several times since my phone call and has a 3 month F/U on 06/20/19. - CF

## 2019-04-04 ENCOUNTER — Other Ambulatory Visit: Payer: Self-pay | Admitting: Family Medicine

## 2019-04-04 DIAGNOSIS — K2100 Gastro-esophageal reflux disease with esophagitis, without bleeding: Secondary | ICD-10-CM

## 2019-05-13 ENCOUNTER — Other Ambulatory Visit: Payer: Self-pay | Admitting: Family Medicine

## 2019-05-29 ENCOUNTER — Other Ambulatory Visit: Payer: Self-pay | Admitting: Family Medicine

## 2019-05-29 DIAGNOSIS — K2100 Gastro-esophageal reflux disease with esophagitis, without bleeding: Secondary | ICD-10-CM

## 2019-06-03 ENCOUNTER — Other Ambulatory Visit: Payer: Self-pay | Admitting: *Deleted

## 2019-06-08 ENCOUNTER — Telehealth: Payer: Self-pay | Admitting: Family Medicine

## 2019-06-08 NOTE — Telephone Encounter (Signed)
Scheduled tomorrow at 8:30

## 2019-06-08 NOTE — Telephone Encounter (Signed)
Please call patient and let him know he is overdue for follow-up for his diabetes.  Really like to see him back in the office so that we can see where things are added and if he seems to be responding to the medication or not.

## 2019-06-09 ENCOUNTER — Other Ambulatory Visit: Payer: Self-pay

## 2019-06-09 ENCOUNTER — Encounter: Payer: Self-pay | Admitting: Family Medicine

## 2019-06-09 ENCOUNTER — Ambulatory Visit (INDEPENDENT_AMBULATORY_CARE_PROVIDER_SITE_OTHER): Payer: BC Managed Care – PPO | Admitting: Family Medicine

## 2019-06-09 VITALS — BP 160/81 | HR 87 | Ht 65.0 in | Wt 266.0 lb

## 2019-06-09 DIAGNOSIS — I10 Essential (primary) hypertension: Secondary | ICD-10-CM | POA: Diagnosis not present

## 2019-06-09 DIAGNOSIS — E099 Drug or chemical induced diabetes mellitus without complications: Secondary | ICD-10-CM

## 2019-06-09 DIAGNOSIS — E291 Testicular hypofunction: Secondary | ICD-10-CM | POA: Diagnosis not present

## 2019-06-09 DIAGNOSIS — T380X5D Adverse effect of glucocorticoids and synthetic analogues, subsequent encounter: Secondary | ICD-10-CM

## 2019-06-09 LAB — POCT GLYCOSYLATED HEMOGLOBIN (HGB A1C): Hemoglobin A1C: 6.8 % — AB (ref 4.0–5.6)

## 2019-06-09 MED ORDER — LISINOPRIL 5 MG PO TABS: 5.0000 mg | ORAL_TABLET | Freq: Every day | ORAL | 0 refills | Status: DC

## 2019-06-09 MED ORDER — ALPRAZOLAM 0.5 MG PO TABS: 0.5000 mg | ORAL_TABLET | Freq: Every evening | ORAL | 0 refills | Status: DC | PRN

## 2019-06-09 NOTE — Addendum Note (Signed)
Addended by: Towana Badger on: 06/09/2019 09:00 AM   Modules accepted: Orders

## 2019-06-09 NOTE — Assessment & Plan Note (Signed)
He is actually overdue to repeat lab work but says he will go to the lab today.

## 2019-06-09 NOTE — Progress Notes (Addendum)
Established Patient Office Visit  Subjective:  Patient ID: Christian Gregory, male    DOB: [birth year]  Age: 48 y.o. MRN: [id number]  CC:  Chief Complaint  Patient presents with  . Diabetes    HPI TIMUR NIBERT presents for   Hypertension- Pt denies chest pain, SOB, dizziness, or heart palpitations.  Taking meds as directed w/o problems.  Denies medication side effects.   Diabetes - no hypoglycemic events. No wounds or sores that are not healing well. No increased thirst or urination. Checking glucose at home. Taking medications as prescribed without any side effects. Last a1c was 7.1.  Started on trulicity for glucose control and weight loss.  He has not noticed any weight loss.  He has noticed a few more headaches than usual but no other side effects.  He also recently decreased his caffeine intake so was not sure if that could be triggering it as well but did want to mention it.  F/U hypogonadism - he is still doing the testosterone.  Never went for labs. Haven't check labs in over 6 months.  He has happy with his regimen and feels like he is doing well on it.  He knows he needs to get blood work done.  Past Medical History:  Diagnosis Date  . Anxiety    anger problems  . Eosinophilic esophagitis   . Fatty liver   . History of methicillin resistant staphylococcus aureus (MRSA)   . Hypertension   . Obesity     Past Surgical History:  Procedure Laterality Date  . ESOPHAGEAL DILATION      Family History  Problem Relation Age of Onset  . Hypertension Mother        father  . Stroke Father     Social History   Socioeconomic History  . Marital status: Married    Spouse name: Not on file  . Number of children: Not on file  . Years of education: Not on file  . Highest education level: Not on file  Occupational History  . Not on file  Social Needs  . Financial resource strain: Not on file  . Food insecurity    Worry: Not on file    Inability: Not on file  .  Transportation needs    Medical: Not on file    Non-medical: Not on file  Tobacco Use  . Smoking status: Never Smoker  . Smokeless tobacco: Never Used  Substance and Sexual Activity  . Alcohol use: Yes    Comment: 6 pack of beer 2-3 x a week  . Drug use: No  . Sexual activity: Yes  Lifestyle  . Physical activity    Days per week: Not on file    Minutes per session: Not on file  . Stress: Not on file  Relationships  . Social Herbalist on phone: Not on file    Gets together: Not on file    Attends religious service: Not on file    Active member of club or organization: Not on file    Attends meetings of clubs or organizations: Not on file    Relationship status: Not on file  . Intimate partner violence    Fear of current or ex partner: Not on file    Emotionally abused: Not on file    Physically abused: Not on file    Forced sexual activity: Not on file  Other Topics Concern  . Not on file  Social History Narrative  .  Not on file    Outpatient Medications Prior to Visit  Medication Sig Dispense Refill  . Accu-Chek FastClix Lancets MISC USE UP TO 4 TIMES A DAY FOR BLOOD SUGAR TESTING    . ACCU-CHEK GUIDE test strip USE AS DIRECTED TO TEST BLOOD SUGAR UP TO 4 TIMES A DAY 400 strip PRN  . albuterol (PROVENTIL HFA;VENTOLIN HFA) 108 (90 Base) MCG/ACT inhaler Inhale 2 puffs into the lungs every 6 (six) hours as needed for wheezing or shortness of breath. 1 Inhaler 11  . blood glucose meter kit and supplies KIT Dispense based on patient and insurance preference. Use up to four times daily as directed. (FOR ICD-9 250.00, 250.01). 1 each 0  . DULERA 200-5 MCG/ACT AERO Inhale 2 puffs into the lungs 2 (two) times a day.    . gabapentin (NEURONTIN) 300 MG capsule TAKE ONE CAPSULE BY MOUTH DAILY 90 capsule 2  . Insulin Pen Needle 32G X 4 MM MISC Use to inject into the skin daily 100 each prn  . NUCALA 100 MG/ML SOAJ     . omeprazole (PRILOSEC) 40 MG capsule Take 1 capsule (40  mg total) by mouth daily. Needs appt for refills 60 capsule 0  . oxybutynin (DITROPAN) 5 MG tablet Take by mouth.    . SPIRIVA RESPIMAT 2.5 MCG/ACT AERS Inhale 1 puff into the lungs daily.    Marland Kitchen testosterone cypionate (DEPOTESTOSTERONE CYPIONATE) 200 MG/ML injection INJECT 1 ML (200 MG TOTAL) INTO THE MUSCLE EVERY 14 (FOURTEEN) DAYS. 6 mL 0  . TRULICITY 7.07 EM/7.5QG SOPN INJECT 0.75 MG INTO THE SKIN EVERY 7 (SEVEN) DAYS. 2 pen 1   No facility-administered medications prior to visit.     Allergies  Allergen Reactions  . Penicillins Anaphylaxis and Swelling    REACTION: anaphalatic      ROS Review of Systems    Objective:    Physical Exam  Constitutional: He is oriented to person, place, and time. He appears well-developed and well-nourished.  HENT:  Head: Normocephalic and atraumatic.  Cardiovascular: Normal rate, regular rhythm and normal heart sounds.  Pulmonary/Chest: Effort normal and breath sounds normal.  Neurological: He is alert and oriented to person, place, and time.  Skin: Skin is warm and dry.  Psychiatric: He has a normal mood and affect. His behavior is normal.    BP (!) 160/81   Pulse 87   Ht 5' 5"  (1.651 m)   Wt 266 lb (120.7 kg)   SpO2 98%   BMI 44.26 kg/m  Wt Readings from Last 3 Encounters:  06/09/19 266 lb (120.7 kg)  02/10/19 262 lb (118.8 kg)  01/14/19 262 lb (118.8 kg)     There are no preventive care reminders to display for this patient.  There are no preventive care reminders to display for this patient.  Lab Results  Component Value Date   TSH 2.542 09/02/2012   Lab Results  Component Value Date   WBC 13.1 (H) 09/08/2018   HGB 17.5 (H) 09/08/2018   HCT 50.5 (H) 09/08/2018   MCV 90.8 09/08/2018   PLT 338 09/08/2018   Lab Results  Component Value Date   NA 139 02/04/2018   K 4.5 02/04/2018   CO2 27 02/04/2018   GLUCOSE 140 (H) 02/04/2018   BUN 22 02/04/2018   CREATININE 0.94 02/04/2018   BILITOT 1.0 02/04/2018   ALKPHOS  58 01/05/2017   AST 14 02/04/2018   ALT 25 02/04/2018   PROT 7.3 02/04/2018   ALBUMIN 4.4 01/05/2017  CALCIUM 9.9 02/04/2018   Lab Results  Component Value Date   CHOL 202 (H) 02/04/2018   Lab Results  Component Value Date   HDL 69 02/04/2018   Lab Results  Component Value Date   LDLCALC 115 (H) 02/04/2018   Lab Results  Component Value Date   TRIG 79 02/04/2018   Lab Results  Component Value Date   CHOLHDL 2.9 02/04/2018   Lab Results  Component Value Date   HGBA1C 7.6 (A) 02/10/2019      Assessment & Plan:   Problem List Items Addressed This Visit      Cardiovascular and Mediastinum   ESSENTIAL HYPERTENSION, BENIGN - Primary    Blood pressure has been elevated several times in a row he says even at home it has been running a little bit high.  Would like to start him on a low-dose of lisinopril continue work on healthy diet and regular exercise and weight loss.  He is back off of prednisone now so that may help as well.  It seems to shoot up every time he gets back on prednisone.      Relevant Medications   lisinopril (ZESTRIL) 5 MG tablet   Other Relevant Orders   COMPLETE METABOLIC PANEL WITH GFR   Lipid panel     Endocrine   Steroid-induced diabetes (HCC)    A1c looks great today compared to previous he is made great strides on the Trulicity 1.55 mg.  Continue to work on healthy diet and regular exercise and weight loss.  We will continue with current regimen and follow-up in 3 to 4 months.      Relevant Medications   lisinopril (ZESTRIL) 5 MG tablet   Hypogonadism male    He is actually overdue to repeat lab work but says he will go to the lab today.      Relevant Orders   CBC   PSA      Meds ordered this encounter  Medications  . ALPRAZolam (XANAX) 0.5 MG tablet    Sig: Take 1 tablet (0.5 mg total) by mouth at bedtime as needed.    Dispense:  20 tablet    Refill:  0  . lisinopril (ZESTRIL) 5 MG tablet    Sig: Take 1 tablet (5 mg total) by  mouth daily.    Dispense:  90 tablet    Refill:  0    Follow-up: Return in about 4 months (around 10/10/2019) for Diabetes follow-up.    Beatrice Lecher, MD

## 2019-06-09 NOTE — Assessment & Plan Note (Signed)
A1c looks great today compared to previous he is made great strides on the Trulicity 0.37 mg.  Continue to work on healthy diet and regular exercise and weight loss.  We will continue with current regimen and follow-up in 3 to 4 months.

## 2019-06-09 NOTE — Assessment & Plan Note (Signed)
Blood pressure has been elevated several times in a row he says even at home it has been running a little bit high.  Would like to start him on a low-dose of lisinopril continue work on healthy diet and regular exercise and weight loss.  He is back off of prednisone now so that may help as well.  It seems to shoot up every time he gets back on prednisone.

## 2019-06-10 LAB — COMPLETE METABOLIC PANEL WITH GFR
AG Ratio: 1.6 (calc) (ref 1.0–2.5)
ALT: 52 U/L — ABNORMAL HIGH (ref 9–46)
AST: 24 U/L (ref 10–40)
Albumin: 4.2 g/dL (ref 3.6–5.1)
Alkaline phosphatase (APISO): 40 U/L (ref 36–130)
BUN: 13 mg/dL (ref 7–25)
CO2: 29 mmol/L (ref 20–32)
Calcium: 9.4 mg/dL (ref 8.6–10.3)
Chloride: 101 mmol/L (ref 98–110)
Creat: 0.96 mg/dL (ref 0.60–1.35)
GFR, Est African American: 107 mL/min/{1.73_m2} (ref >=60)
GFR, Est Non African American: 92 mL/min/{1.73_m2} (ref >=60)
Globulin: 2.6 g/dL (calc) (ref 1.9–3.7)
Glucose, Bld: 161 mg/dL — ABNORMAL HIGH (ref 65–99)
Potassium: 4.5 mmol/L (ref 3.5–5.3)
Sodium: 139 mmol/L (ref 135–146)
Total Bilirubin: 0.6 mg/dL (ref 0.2–1.2)
Total Protein: 6.8 g/dL (ref 6.1–8.1)

## 2019-06-10 LAB — CBC
HCT: 47.2 % (ref 38.5–50.0)
Hemoglobin: 16.1 g/dL (ref 13.2–17.1)
MCH: 31.4 pg (ref 27.0–33.0)
MCHC: 34.1 g/dL (ref 32.0–36.0)
MCV: 92.2 fL (ref 80.0–100.0)
MPV: 10.3 fL (ref 7.5–12.5)
Platelets: 340 10*3/uL (ref 140–400)
RBC: 5.12 10*6/uL (ref 4.20–5.80)
RDW: 12.4 % (ref 11.0–15.0)
WBC: 6.2 10*3/uL (ref 3.8–10.8)

## 2019-06-10 LAB — LIPID PANEL
Cholesterol: 223 mg/dL — ABNORMAL HIGH (ref <200)
HDL: 45 mg/dL (ref >=40)
LDL Cholesterol (Calc): 142 mg/dL (calc) — ABNORMAL HIGH
Non-HDL Cholesterol (Calc): 178 mg/dL (calc) — ABNORMAL HIGH (ref <130)
Total CHOL/HDL Ratio: 5 (calc) — ABNORMAL HIGH (ref <5.0)
Triglycerides: 216 mg/dL — ABNORMAL HIGH (ref <150)

## 2019-06-10 LAB — PSA: PSA: 0.5 ng/mL (ref <=4.0)

## 2019-06-17 ENCOUNTER — Encounter: Payer: Self-pay | Admitting: Family Medicine

## 2019-06-17 MED ORDER — TESTOSTERONE CYPIONATE 200 MG/ML IM SOLN: 200.0000 mg | INTRAMUSCULAR | 0 refills | Status: DC

## 2019-06-21 DIAGNOSIS — R41840 Attention and concentration deficit: Secondary | ICD-10-CM | POA: Diagnosis not present

## 2019-06-21 DIAGNOSIS — Z79899 Other long term (current) drug therapy: Secondary | ICD-10-CM | POA: Diagnosis not present

## 2019-06-21 DIAGNOSIS — F419 Anxiety disorder, unspecified: Secondary | ICD-10-CM | POA: Diagnosis not present

## 2019-07-11 ENCOUNTER — Encounter: Payer: Self-pay | Admitting: Family Medicine

## 2019-07-11 ENCOUNTER — Other Ambulatory Visit: Payer: Self-pay

## 2019-07-11 ENCOUNTER — Ambulatory Visit (INDEPENDENT_AMBULATORY_CARE_PROVIDER_SITE_OTHER): Payer: BC Managed Care – PPO | Admitting: Family Medicine

## 2019-07-11 VITALS — Temp 100.7°F

## 2019-07-11 DIAGNOSIS — R197 Diarrhea, unspecified: Secondary | ICD-10-CM

## 2019-07-11 DIAGNOSIS — R059 Cough, unspecified: Secondary | ICD-10-CM

## 2019-07-11 DIAGNOSIS — R509 Fever, unspecified: Secondary | ICD-10-CM | POA: Diagnosis not present

## 2019-07-11 DIAGNOSIS — R05 Cough: Secondary | ICD-10-CM | POA: Diagnosis not present

## 2019-07-11 DIAGNOSIS — Z20828 Contact with and (suspected) exposure to other viral communicable diseases: Secondary | ICD-10-CM

## 2019-07-11 DIAGNOSIS — Z20822 Contact with and (suspected) exposure to covid-19: Secondary | ICD-10-CM

## 2019-07-11 NOTE — Progress Notes (Signed)
Virtual Visit via Video Note  I connected with Christian Gregory on 07/11/19 at 10:10 AM EST by a video enabled telemedicine application and verified that I am speaking with the correct person using two identifiers.   I discussed the limitations of evaluation and management by telemedicine and the availability of in person appointments. The patient expressed understanding and agreed to proceed.  Subjective:    CC: diarrhea, fever  HPI:  Sxs began 2 days ago with watery diarrhea and fever.  Using tylenol.  Highest temp has been 102.2.  He is also had some chest congestion cough and some mild shortness of breath.  He has had a headache that he describes as mild but just coming and going.  No vomiting.  He has chronic loss of taste and smell so this is not new.  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  Body else in the home has been sick.  Wife Anderson Malta was present for the video visit as patient was lying in bed.  Past medical history, Surgical history, Family history not pertinant except as noted below, Social history, Allergies, and medications have been entered into the medical record, reviewed, and corrections made.   Review of Systems: No fevers, chills, night sweats, weight loss, chest pain, or shortness of breath.   Objective:    General: Speaking clearly in complete sentences without any shortness of breath.  Alert and oriented x3.  Normal judgment. No apparent acute distress.     Impression and Recommendations:    Upper respiratory symptoms with diarrhea and fever-suspect Covid.  Recommend that he get tested he says it is going around his workplace.  In the meantime encouraged him to really push his water and fluid intake.  Okay to use Tylenol and ibuprofen alternating as needed for fever relief.  If he develops any worsening cough or shortness of breath and please let us know.  Asthma stay out of work until fever free for at  least 48 hours and negative Covid test.    I discussed the assessment and treatment plan with the patient. The patient was provided an opportunity to ask questions and all were answered. The patient agreed with the plan and demonstrated an understanding of the instructions.   The patient was advised to call back or seek an in-person evaluation if the symptoms worsen or if the condition fails to improve as anticipated.   Beatrice Lecher, MD

## 2019-07-11 NOTE — Progress Notes (Signed)
sxs began on Saturday.

## 2019-07-12 DIAGNOSIS — U071 COVID-19: Secondary | ICD-10-CM | POA: Diagnosis not present

## 2019-07-25 ENCOUNTER — Other Ambulatory Visit: Payer: Self-pay | Admitting: Family Medicine

## 2019-07-25 DIAGNOSIS — K2100 Gastro-esophageal reflux disease with esophagitis, without bleeding: Secondary | ICD-10-CM

## 2019-08-06 ENCOUNTER — Other Ambulatory Visit: Payer: Self-pay | Admitting: Family Medicine

## 2019-08-17 DIAGNOSIS — Z79899 Other long term (current) drug therapy: Secondary | ICD-10-CM | POA: Diagnosis not present

## 2019-08-17 DIAGNOSIS — F902 Attention-deficit hyperactivity disorder, combined type: Secondary | ICD-10-CM | POA: Diagnosis not present

## 2019-08-17 DIAGNOSIS — R41840 Attention and concentration deficit: Secondary | ICD-10-CM | POA: Diagnosis not present

## 2019-08-17 DIAGNOSIS — F419 Anxiety disorder, unspecified: Secondary | ICD-10-CM | POA: Diagnosis not present

## 2019-09-07 ENCOUNTER — Other Ambulatory Visit: Payer: Self-pay | Admitting: Family Medicine

## 2019-09-07 DIAGNOSIS — I10 Essential (primary) hypertension: Secondary | ICD-10-CM

## 2019-09-26 ENCOUNTER — Telehealth: Payer: Self-pay | Admitting: Family Medicine

## 2019-09-26 NOTE — Telephone Encounter (Signed)
Approved today (Trulicity) Effective from 09/26/2019 through 09/24/2020. Pharmacy aware.

## 2019-10-11 ENCOUNTER — Ambulatory Visit: Payer: BC Managed Care – PPO | Admitting: Family Medicine

## 2019-10-15 ENCOUNTER — Encounter: Payer: Self-pay | Admitting: Family Medicine

## 2019-10-18 NOTE — Telephone Encounter (Signed)
Left patient a voicemail with information below. Let patient know to call us back to schedule an appointment. 

## 2019-10-22 ENCOUNTER — Other Ambulatory Visit: Payer: Self-pay | Admitting: Family Medicine

## 2019-10-24 NOTE — Telephone Encounter (Signed)
Needs appoinment before further refills

## 2019-11-14 ENCOUNTER — Other Ambulatory Visit: Payer: Self-pay | Admitting: Family Medicine

## 2019-11-14 DIAGNOSIS — F902 Attention-deficit hyperactivity disorder, combined type: Secondary | ICD-10-CM | POA: Diagnosis not present

## 2019-11-14 DIAGNOSIS — Z79899 Other long term (current) drug therapy: Secondary | ICD-10-CM | POA: Diagnosis not present

## 2019-11-23 ENCOUNTER — Other Ambulatory Visit: Payer: Self-pay | Admitting: Sports Medicine

## 2019-11-23 DIAGNOSIS — G5603 Carpal tunnel syndrome, bilateral upper limbs: Secondary | ICD-10-CM

## 2019-12-06 ENCOUNTER — Ambulatory Visit (INDEPENDENT_AMBULATORY_CARE_PROVIDER_SITE_OTHER): Payer: BC Managed Care – PPO | Admitting: Family Medicine

## 2019-12-06 ENCOUNTER — Encounter: Payer: Self-pay | Admitting: Family Medicine

## 2019-12-06 ENCOUNTER — Other Ambulatory Visit: Payer: Self-pay

## 2019-12-06 VITALS — BP 133/72 | HR 89 | Ht 65.0 in | Wt 255.0 lb

## 2019-12-06 DIAGNOSIS — E785 Hyperlipidemia, unspecified: Secondary | ICD-10-CM | POA: Diagnosis not present

## 2019-12-06 DIAGNOSIS — T380X5D Adverse effect of glucocorticoids and synthetic analogues, subsequent encounter: Secondary | ICD-10-CM

## 2019-12-06 DIAGNOSIS — I10 Essential (primary) hypertension: Secondary | ICD-10-CM | POA: Diagnosis not present

## 2019-12-06 DIAGNOSIS — E099 Drug or chemical induced diabetes mellitus without complications: Secondary | ICD-10-CM

## 2019-12-06 LAB — POCT GLYCOSYLATED HEMOGLOBIN (HGB A1C): Hemoglobin A1C: 6.2 % — AB (ref 4.0–5.6)

## 2019-12-06 MED ORDER — LISINOPRIL 10 MG PO TABS: 5.0000 mg | ORAL_TABLET | Freq: Every day | ORAL | 0 refills | Status: DC

## 2019-12-06 MED ORDER — TRULICITY 1.5 MG/0.5ML ~~LOC~~ SOAJ: 1.5000 mg | SUBCUTANEOUS | 1 refills | Status: DC

## 2019-12-06 NOTE — Assessment & Plan Note (Signed)
Blood pressure looks much better today and he is lost 11 pounds which is awesome.  Would still like to get the blood pressure into the 120s we discussed increasing lisinopril from 5 mg to 10 mg.  New prescription sent to pharmacy.  Follow-up in 3 to 4 months.

## 2019-12-06 NOTE — Progress Notes (Signed)
Established Patient Office Visit  Subjective:  Patient ID: Christian Gregory, male    DOB: July 31, 1971  Age: 49 y.o. MRN: [id number]  CC:  Chief Complaint  Patient presents with  . Diabetes    HPI Christian Gregory presents for   Hypertension- Pt denies chest pain, SOB, dizziness, or heart palpitations.  Taking meds as directed w/o problems.  Denies medication side effects.  At home on lisinopril 6 months ago.  Follow-up steroid-induced diabetes.  He is currently on Trulicity 7.09 mg and doing well.  He is actually down 11 pounds since he was here in October approximately 6 months ago.  Really been trying to work it is diet and exercise routine.  Blood pressure also looks better today it was elevated when he was here in October. We started him on lisinopril.  He is tolerating it well without any side effects or problems.  Past Medical History:  Diagnosis Date  . Anxiety    anger problems  . Eosinophilic esophagitis   . Fatty liver   . History of methicillin resistant staphylococcus aureus (MRSA)   . Hypertension   . Obesity     Past Surgical History:  Procedure Laterality Date  . ESOPHAGEAL DILATION      Family History  Problem Relation Age of Onset  . Hypertension Mother        father  . Stroke Father     Social History   Socioeconomic History  . Marital status: Married    Spouse name: Not on file  . Number of children: Not on file  . Years of education: Not on file  . Highest education level: Not on file  Occupational History  . Not on file  Tobacco Use  . Smoking status: Never Smoker  . Smokeless tobacco: Never Used  Substance and Sexual Activity  . Alcohol use: Yes    Comment: 6 pack of beer 2-3 x a week  . Drug use: No  . Sexual activity: Yes  Other Topics Concern  . Not on file  Social History Narrative  . Not on file   Social Determinants of Health   Financial Resource Strain:   . Difficulty of Paying Living Expenses:   Food Insecurity:   .  Worried About Charity fundraiser in the Last Year:   . Arboriculturist in the Last Year:   Transportation Needs:   . Film/video editor (Medical):   Marland Kitchen Lack of Transportation (Non-Medical):   Physical Activity:   . Days of Exercise per Week:   . Minutes of Exercise per Session:   Stress:   . Feeling of Stress :   Social Connections:   . Frequency of Communication with Friends and Family:   . Frequency of Social Gatherings with Friends and Family:   . Attends Religious Services:   . Active Member of Clubs or Organizations:   . Attends Archivist Meetings:   Marland Kitchen Marital Status:   Intimate Partner Violence:   . Fear of Current or Ex-Partner:   . Emotionally Abused:   Marland Kitchen Physically Abused:   . Sexually Abused:     Outpatient Medications Prior to Visit  Medication Sig Dispense Refill  . Accu-Chek FastClix Lancets MISC USE UP TO 4 TIMES A DAY FOR BLOOD SUGAR TESTING    . ACCU-CHEK GUIDE test strip USE AS DIRECTED TO TEST BLOOD SUGAR UP TO 4 TIMES A DAY 400 strip PRN  . albuterol (PROVENTIL HFA;VENTOLIN HFA) 108 (  90 Base) MCG/ACT inhaler Inhale 2 puffs into the lungs every 6 (six) hours as needed for wheezing or shortness of breath. 1 Inhaler 11  . ALPRAZolam (XANAX) 0.5 MG tablet Take 1 tablet (0.5 mg total) by mouth at bedtime as needed. 20 tablet 0  . blood glucose meter kit and supplies KIT Dispense based on patient and insurance preference. Use up to four times daily as directed. (FOR ICD-9 250.00, 250.01). 1 each 0  . DULERA 200-5 MCG/ACT AERO Inhale 2 puffs into the lungs 2 (two) times a day.    . Insulin Pen Needle 32G X 4 MM MISC Use to inject into the skin daily 100 each prn  . NUCALA 100 MG/ML SOAJ     . SPIRIVA RESPIMAT 2.5 MCG/ACT AERS Inhale 1 puff into the lungs daily.    Marland Kitchen testosterone cypionate (DEPOTESTOSTERONE CYPIONATE) 200 MG/ML injection Inject 1 mL (200 mg total) into the muscle every 14 (fourteen) days. 6 mL 0  . Dulaglutide (TRULICITY) 0.96 GE/3.6OQ  SOPN Inject 0.75 mg into the skin every 7 (seven) days. PLEASE SCHEDULE AN APPOINTMENT FOR FURTHER REFILLS 2 pen 0  . gabapentin (NEURONTIN) 300 MG capsule TAKE ONE CAPSULE BY MOUTH DAILY 90 capsule 2  . lisinopril (ZESTRIL) 5 MG tablet TAKE 1 TABLET BY MOUTH EVERY DAY 90 tablet 0  . omeprazole (PRILOSEC) 40 MG capsule Take 1 capsule (40 mg total) by mouth daily. 90 capsule 1  . oxybutynin (DITROPAN) 5 MG tablet Take by mouth.     No facility-administered medications prior to visit.    Allergies  Allergen Reactions  . Penicillins Anaphylaxis and Swelling    REACTION: anaphalatic      ROS Review of Systems    Objective:    Physical Exam  Constitutional: He is oriented to person, place, and time. He appears well-developed and well-nourished.  HENT:  Head: Normocephalic and atraumatic.  Right Ear: External ear normal.  Left Ear: External ear normal.  Nose: Nose normal.  Mouth/Throat: Oropharynx is clear and moist.  Eyes: Pupils are equal, round, and reactive to light. Conjunctivae and EOM are normal.  Neck: No thyromegaly present.  Cardiovascular: Normal rate, regular rhythm, normal heart sounds and intact distal pulses.  Pulmonary/Chest: Effort normal and breath sounds normal.  Abdominal: Soft. Bowel sounds are normal. He exhibits no distension and no mass. There is no abdominal tenderness. There is no rebound and no guarding.  Musculoskeletal:        General: Normal range of motion.     Cervical back: Normal range of motion and neck supple.  Lymphadenopathy:    He has no cervical adenopathy.  Neurological: He is alert and oriented to person, place, and time. He has normal reflexes.  Skin: Skin is warm and dry.  Psychiatric: He has a normal mood and affect. His behavior is normal. Judgment and thought content normal.    BP 133/72   Pulse 89   Ht _0  (1.651 m)   Wt 255 lb (115.7 kg)   SpO2 98%   BMI 42.43 kg/m  Wt Readings from Last 3 Encounters:  12/06/19 255 lb  (115.7 kg)  06/09/19 266 lb (120.7 kg)  02/10/19 262 lb (118.8 kg)     There are no preventive care reminders to display for this patient.  There are no preventive care reminders to display for this patient.  Lab Results  Component Value Date   TSH 2.542 09/02/2012   Lab Results  Component Value Date  WBC 6.2 06/09/2019   HGB 16.1 06/09/2019   HCT 47.2 06/09/2019   MCV 92.2 06/09/2019   PLT 340 06/09/2019   Lab Results  Component Value Date   NA 139 06/09/2019   K 4.5 06/09/2019   CO2 29 06/09/2019   GLUCOSE 161 (H) 06/09/2019   BUN 13 06/09/2019   CREATININE 0.96 06/09/2019   BILITOT 0.6 06/09/2019   ALKPHOS 58 01/05/2017   AST 24 06/09/2019   ALT 52 (H) 06/09/2019   PROT 6.8 06/09/2019   ALBUMIN 4.4 01/05/2017   CALCIUM 9.4 06/09/2019   Lab Results  Component Value Date   CHOL 223 (H) 06/09/2019   Lab Results  Component Value Date   HDL 45 06/09/2019   Lab Results  Component Value Date   LDLCALC 142 (H) 06/09/2019   Lab Results  Component Value Date   TRIG 216 (H) 06/09/2019   Lab Results  Component Value Date   CHOLHDL 5.0 (H) 06/09/2019   Lab Results  Component Value Date   HGBA1C 6.2 (A) 12/06/2019      Assessment & Plan:   Problem List Items Addressed This Visit      Cardiovascular and Mediastinum   ESSENTIAL HYPERTENSION, BENIGN    Blood pressure looks much better today and he is lost 11 pounds which is awesome.  Would still like to get the blood pressure into the 120s we discussed increasing lisinopril from 5 mg to 10 mg.  New prescription sent to pharmacy.  Follow-up in 3 to 4 months.      Relevant Medications   lisinopril (ZESTRIL) 10 MG tablet   Other Relevant Orders   BASIC METABOLIC PANEL WITH GFR     Endocrine   Steroid-induced diabetes ([place]) - Primary    A1c looks great today at 6.2 better than previous at 6.8+ he has lost weight which is fantastic.  Plan to continue with Trulicity for now and follow back up in 4  months.      Relevant Medications   Dulaglutide (TRULICITY) 1.5 LP/5.3YY SOPN   lisinopril (ZESTRIL) 10 MG tablet   Other Relevant Orders   POCT glycosylated hemoglobin (Hb A1C) (Completed)   BASIC METABOLIC PANEL WITH GFR     Other   Hyperlipidemia    Based on current guidelines would recommend a statin based on his cholesterol.  Unfortunately again I think the diabetes is mostly steroid-induced.      Relevant Medications   lisinopril (ZESTRIL) 10 MG tablet        Meds ordered this encounter  Medications  . Dulaglutide (TRULICITY) 1.5 FR/1.0YT SOPN    Sig: Inject 1.5 mg into the skin once a week.    Dispense:  12 pen    Refill:  1  . lisinopril (ZESTRIL) 10 MG tablet    Sig: Take 0.5 tablets (5 mg total) by mouth daily.    Dispense:  90 tablet    Refill:  0    Follow-up: Return in about 4 months (around 04/06/2020) for Diabetes follow-up, Hypertension.    Beatrice Lecher, MD

## 2019-12-06 NOTE — Assessment & Plan Note (Signed)
Based on current guidelines would recommend a statin based on his cholesterol.  Unfortunately again I think the diabetes is mostly steroid-induced.

## 2019-12-06 NOTE — Assessment & Plan Note (Signed)
A1c looks great today at 6.2 better than previous at 6.8+ he has lost weight which is fantastic.  Plan to continue with Trulicity for now and follow back up in 4 months.

## 2019-12-07 ENCOUNTER — Other Ambulatory Visit: Payer: Self-pay

## 2019-12-07 ENCOUNTER — Other Ambulatory Visit: Payer: Self-pay | Admitting: Family Medicine

## 2019-12-07 LAB — BASIC METABOLIC PANEL WITH GFR
BUN: 15 mg/dL (ref 7–25)
CO2: 29 mmol/L (ref 20–32)
Calcium: 9.7 mg/dL (ref 8.6–10.3)
Chloride: 104 mmol/L (ref 98–110)
Creat: 1.03 mg/dL (ref 0.60–1.35)
GFR, Est African American: 98 mL/min/{1.73_m2} (ref >=60)
GFR, Est Non African American: 85 mL/min/{1.73_m2} (ref >=60)
Glucose, Bld: 151 mg/dL — ABNORMAL HIGH (ref 65–99)
Potassium: 4.9 mmol/L (ref 3.5–5.3)
Sodium: 143 mmol/L (ref 135–146)

## 2019-12-07 MED ORDER — TESTOSTERONE CYPIONATE 200 MG/ML IM SOLN: 200.0000 mg | INTRAMUSCULAR | 0 refills | Status: DC

## 2019-12-07 NOTE — Progress Notes (Signed)
All labs are normal. 

## 2019-12-07 NOTE — Progress Notes (Signed)
Ok, rx sent for testosterone.

## 2019-12-08 ENCOUNTER — Telehealth: Payer: Self-pay | Admitting: Family Medicine

## 2019-12-08 NOTE — Telephone Encounter (Signed)
Received fax for PA on Testosterone sent through cover my meds waiting on determination. - CF 

## 2019-12-13 NOTE — Telephone Encounter (Signed)
Received fax from [facility] and they approved Testosterone Cypionate from 12/08/19 - 08/17/2038. - CF

## 2020-02-06 ENCOUNTER — Telehealth (INDEPENDENT_AMBULATORY_CARE_PROVIDER_SITE_OTHER): Payer: BC Managed Care – PPO | Admitting: Family Medicine

## 2020-02-06 DIAGNOSIS — Z6841 Body Mass Index (BMI) 40.0 and over, adult: Secondary | ICD-10-CM

## 2020-02-06 DIAGNOSIS — I10 Essential (primary) hypertension: Secondary | ICD-10-CM

## 2020-02-06 DIAGNOSIS — R635 Abnormal weight gain: Secondary | ICD-10-CM | POA: Diagnosis not present

## 2020-02-06 MED ORDER — LISINOPRIL 10 MG PO TABS: 10.0000 mg | ORAL_TABLET | Freq: Every day | ORAL | 1 refills | Status: DC

## 2020-02-06 MED ORDER — ALBUTEROL SULFATE HFA 108 (90 BASE) MCG/ACT IN AERS: 2.0000 | INHALATION_SPRAY | Freq: Four times a day (QID) | RESPIRATORY_TRACT | 2 refills | Status: DC | PRN

## 2020-02-06 MED ORDER — AMBULATORY NON FORMULARY MEDICATION: 0 refills | Status: DC

## 2020-02-06 NOTE — Progress Notes (Signed)
Virtual Visit via Video Note  I connected with Christian Gregory on 02/08/20 at  9:50 AM EDT by a video enabled telemedicine application and verified that I am speaking with the correct person using two identifiers.   I discussed the limitations of evaluation and management by telemedicine and the availability of in person appointments. The patient expressed understanding and agreed to proceed.  Patient location: at work Provider location: in office   Established Patient Office Visit  Subjective:  Patient ID: Christian Gregory, male    DOB: [birth year]  Age: 49 y.o. MRN: [id number]  CC: No chief complaint on file.   HPI Christian Gregory presents to discuss weight loss medication. He is particularly interested in [place].  He currently weighs right around 255 pounds with clothes on he actually says his weight was higher and has been trying to lose weight but just feels like he is really plateaued.  He stays very active but cannot do too much because of his ankle he really needs ankle replacement.  More recently he says he is actually planning on really cutting back on his carb intake which has been helpful for him in the past.  He has been on medication such as phentermine and Saxenda in the past.  Past Medical History:  Diagnosis Date  . Anxiety    anger problems  . Eosinophilic esophagitis   . Fatty liver   . History of methicillin resistant staphylococcus aureus (MRSA)   . Hypertension   . Obesity     Past Surgical History:  Procedure Laterality Date  . ESOPHAGEAL DILATION      Family History  Problem Relation Age of Onset  . Hypertension Mother        father  . Stroke Father     Social History   Socioeconomic History  . Marital status: Married    Spouse name: Not on file  . Number of children: Not on file  . Years of education: Not on file  . Highest education level: Not on file  Occupational History  . Not on file  Tobacco Use  . Smoking status: Never Smoker  . Smokeless  tobacco: Never Used  Substance and Sexual Activity  . Alcohol use: Yes    Comment: 6 pack of beer 2-3 x a week  . Drug use: No  . Sexual activity: Yes  Other Topics Concern  . Not on file  Social History Narrative  . Not on file   Social Determinants of Health   Financial Resource Strain:   . Difficulty of Paying Living Expenses:   Food Insecurity:   . Worried About Charity fundraiser in the Last Year:   . Arboriculturist in the Last Year:   Transportation Needs:   . Film/video editor (Medical):   Marland Kitchen Lack of Transportation (Non-Medical):   Physical Activity:   . Days of Exercise per Week:   . Minutes of Exercise per Session:   Stress:   . Feeling of Stress :   Social Connections:   . Frequency of Communication with Friends and Family:   . Frequency of Social Gatherings with Friends and Family:   . Attends Religious Services:   . Active Member of Clubs or Organizations:   . Attends Archivist Meetings:   Marland Kitchen Marital Status:   Intimate Partner Violence:   . Fear of Current or Ex-Partner:   . Emotionally Abused:   Marland Kitchen Physically Abused:   . Sexually Abused:  Outpatient Medications Prior to Visit  Medication Sig Dispense Refill  . Accu-Chek FastClix Lancets MISC USE UP TO 4 TIMES A DAY FOR BLOOD SUGAR TESTING    . ACCU-CHEK GUIDE test strip USE AS DIRECTED TO TEST BLOOD SUGAR UP TO 4 TIMES A DAY 400 strip PRN  . blood glucose meter kit and supplies KIT Dispense based on patient and insurance preference. Use up to four times daily as directed. (FOR ICD-9 250.00, 250.01). 1 each 0  . DULERA 200-5 MCG/ACT AERO Inhale 2 puffs into the lungs 2 (two) times a day.    . Insulin Pen Needle 32G X 4 MM MISC Use to inject into the skin daily 100 each prn  . NUCALA 100 MG/ML SOAJ     . SPIRIVA RESPIMAT 2.5 MCG/ACT AERS Inhale 1 puff into the lungs daily.    Marland Kitchen testosterone cypionate (DEPOTESTOSTERONE CYPIONATE) 200 MG/ML injection Inject 1 mL (200 mg total) into the  muscle every 14 (fourteen) days. 6 mL 0  . albuterol (PROVENTIL HFA;VENTOLIN HFA) 108 (90 Base) MCG/ACT inhaler Inhale 2 puffs into the lungs every 6 (six) hours as needed for wheezing or shortness of breath. 1 Inhaler 11  . Dulaglutide (TRULICITY) 1.5 EH/6.3JS SOPN Inject 1.5 mg into the skin once a week. 12 pen 1  . lisinopril (ZESTRIL) 10 MG tablet Take 0.5 tablets (5 mg total) by mouth daily. 90 tablet 0  . ALPRAZolam (XANAX) 0.5 MG tablet Take 1 tablet (0.5 mg total) by mouth at bedtime as needed. 20 tablet 0   No facility-administered medications prior to visit.    Allergies  Allergen Reactions  . Penicillins Anaphylaxis and Swelling    REACTION: anaphalatic      ROS Review of Systems    Objective:    Physical Exam Vitals reviewed.  Constitutional:      Appearance: He is well-developed.  HENT:     Head: Normocephalic and atraumatic.  Eyes:     Conjunctiva/sclera: Conjunctivae normal.  Pulmonary:     Effort: Pulmonary effort is normal.  Skin:    General: Skin is dry.     Coloration: Skin is not pale.  Neurological:     Mental Status: He is alert and oriented to person, place, and time.  Psychiatric:        Behavior: Behavior normal.     There were no vitals taken for this visit. Wt Readings from Last 3 Encounters:  12/06/19 255 lb (115.7 kg)  06/09/19 266 lb (120.7 kg)  02/10/19 262 lb (118.8 kg)     Health Maintenance Due  Topic Date Due  . Hepatitis C Screening  Never done    There are no preventive care reminders to display for this patient.  Lab Results  Component Value Date   TSH 2.542 09/02/2012   Lab Results  Component Value Date   WBC 6.2 06/09/2019   HGB 16.1 06/09/2019   HCT 47.2 06/09/2019   MCV 92.2 06/09/2019   PLT 340 06/09/2019   Lab Results  Component Value Date   NA 143 12/06/2019   K 4.9 12/06/2019   CO2 29 12/06/2019   GLUCOSE 151 (H) 12/06/2019   BUN 15 12/06/2019   CREATININE 1.03 12/06/2019   BILITOT 0.6  06/09/2019   ALKPHOS 58 01/05/2017   AST 24 06/09/2019   ALT 52 (H) 06/09/2019   PROT 6.8 06/09/2019   ALBUMIN 4.4 01/05/2017   CALCIUM 9.7 12/06/2019   Lab Results  Component Value Date   CHOL  223 (H) 06/09/2019   Lab Results  Component Value Date   HDL 45 06/09/2019   Lab Results  Component Value Date   LDLCALC 142 (H) 06/09/2019   Lab Results  Component Value Date   TRIG 216 (H) 06/09/2019   Lab Results  Component Value Date   CHOLHDL 5.0 (H) 06/09/2019   Lab Results  Component Value Date   HGBA1C 6.2 (A) 12/06/2019      Assessment & Plan:   Problem List Items Addressed This Visit      Cardiovascular and Mediastinum   ESSENTIAL HYPERTENSION, BENIGN    Updated prescription for full tab of 10 mg daily of lisinopril.  The sake was incorrect on the previous prescription we have corrected that.  Continue current regimen.      Relevant Medications   lisinopril (ZESTRIL) 10 MG tablet     Other   Morbid obesity with body mass index of 40.0-44.9 in adult [facility])   Relevant Medications   AMBULATORY NON FORMULARY MEDICATION   Abnormal weight gain - Primary   Relevant Medications   AMBULATORY NON FORMULARY MEDICATION     Starting BMI: 42 Starting weight: 255 lbs.  Change in BMI: N/A Dietary goal: Plans to limit carbs Exercise goal: somewhat limited because of ankle arthritis but stays active. Medication: Interested in [place].. Discussed risks and benefits of the medication.  Monitor for any signs or symptoms of pancreatitis or hypoglycemia.  We will go ahead and send over new prescription to pharmacy.  Follow-up once has been on the medication for 1 month.  We may be able to get him covered for the initial for 6 months for a trial through the drug manufacturers.  Sent fax to obtain authorization number.  Jenny Reichmann will then contact patient to enter medication through patient portal.   Follow-up: We will need to follow-up in about 4 weeks after starting  medication.  Meds ordered this encounter  Medications  . lisinopril (ZESTRIL) 10 MG tablet    Sig: Take 1 tablet (10 mg total) by mouth daily.    Dispense:  90 tablet    Refill:  1  . albuterol (VENTOLIN HFA) 108 (90 Base) MCG/ACT inhaler    Sig: Inhale 2 puffs into the lungs every 6 (six) hours as needed for wheezing or shortness of breath.    Dispense:  18 g    Refill:  2  . AMBULATORY NON FORMULARY MEDICATION    Sig: Medication Name: Wegovy 0.47m SQ weekly    Dispense:  4 pen    Refill:  0    Follow-up: No follow-ups on file.     Time spent in encounter 20 minutes  I discussed the assessment and treatment plan with the patient. The patient was provided an opportunity to ask questions and all were answered. The patient agreed with the plan and demonstrated an understanding of the instructions.   The patient was advised to call back or seek an in-person evaluation if the symptoms worsen or if the condition fails to improve as anticipated.    CBeatrice Lecher MD

## 2020-02-06 NOTE — Assessment & Plan Note (Signed)
Updated prescription for full tab of 10 mg daily of lisinopril.  The sake was incorrect on the previous prescription we have corrected that.  Continue current regimen.

## 2020-02-07 ENCOUNTER — Encounter: Payer: Self-pay | Admitting: Family Medicine

## 2020-02-08 ENCOUNTER — Encounter: Payer: Self-pay | Admitting: Family Medicine

## 2020-02-08 NOTE — Telephone Encounter (Signed)
Christian Patten, do you recall faxing this?

## 2020-02-14 ENCOUNTER — Encounter: Payer: Self-pay | Admitting: Family Medicine

## 2020-02-14 DIAGNOSIS — F902 Attention-deficit hyperactivity disorder, combined type: Secondary | ICD-10-CM | POA: Diagnosis not present

## 2020-02-14 DIAGNOSIS — Z79899 Other long term (current) drug therapy: Secondary | ICD-10-CM | POA: Diagnosis not present

## 2020-02-15 ENCOUNTER — Other Ambulatory Visit: Payer: Self-pay | Admitting: Family Medicine

## 2020-02-15 DIAGNOSIS — K2100 Gastro-esophageal reflux disease with esophagitis, without bleeding: Secondary | ICD-10-CM

## 2020-03-12 ENCOUNTER — Other Ambulatory Visit: Payer: Self-pay | Admitting: Family Medicine

## 2020-03-21 ENCOUNTER — Other Ambulatory Visit: Payer: Self-pay | Admitting: Family Medicine

## 2020-03-21 ENCOUNTER — Encounter: Payer: Self-pay | Admitting: Family Medicine

## 2020-03-23 ENCOUNTER — Other Ambulatory Visit: Payer: Self-pay | Admitting: Family Medicine

## 2020-04-06 ENCOUNTER — Ambulatory Visit: Payer: BC Managed Care – PPO | Admitting: Family Medicine

## 2020-04-22 ENCOUNTER — Other Ambulatory Visit: Payer: Self-pay | Admitting: Family Medicine

## 2020-04-25 ENCOUNTER — Telehealth: Payer: Self-pay

## 2020-04-25 ENCOUNTER — Encounter: Payer: Self-pay | Admitting: Family Medicine

## 2020-04-25 NOTE — Telephone Encounter (Signed)
Olufemi states he is due for an increase of the [facility] to 1 mg. He needs it sent to [facility] on [address]. Pended pharmacy.

## 2020-04-25 NOTE — Telephone Encounter (Signed)
He needs to schedule follow-up appointment I have not seen him since June he is really supposed to follow-up monthly for at least nurse visit weight checks.

## 2020-04-26 ENCOUNTER — Telehealth: Payer: Self-pay | Admitting: Family Medicine

## 2020-04-26 NOTE — Telephone Encounter (Signed)
Patient advised via MyChart.

## 2020-04-26 NOTE — Telephone Encounter (Signed)
Agapito Games, MD     5:19 PM Note He needs to schedule follow-up appointment I have not seen him since June he is really supposed to follow-up monthly for at least nurse visit weight checks.

## 2020-04-26 NOTE — Telephone Encounter (Signed)
Thank you. Will wait to see what PCP says about nurse visit.

## 2020-04-26 NOTE — Telephone Encounter (Signed)
Appointment has been made for nurse visit. No further questions at this time.

## 2020-04-26 NOTE — Telephone Encounter (Signed)
Ok for nurse visit for this month and schedule with me for next month.  This is why I don't like sending 90 days supply on weight loss med. AVS form 6/21 clearly says 4 week f/u. Ok to refill med x 30 days

## 2020-04-26 NOTE — Telephone Encounter (Signed)
Patient calling in wanting to know if he can get blood work done. States that he was suppose to do this at his last visit but canceled do to football practice. Did not rescheduled and I offered the next open appointment and patient stated that it was too far out. Please advise.

## 2020-04-26 NOTE — Telephone Encounter (Signed)
OK for nurse visit this time

## 2020-04-26 NOTE — Telephone Encounter (Signed)
See other messages for this patient. He is due for appt and he is aware of that.

## 2020-04-27 ENCOUNTER — Ambulatory Visit (INDEPENDENT_AMBULATORY_CARE_PROVIDER_SITE_OTHER): Payer: BC Managed Care – PPO | Admitting: Family Medicine

## 2020-04-27 VITALS — BP 156/89 | HR 79 | Wt 252.0 lb

## 2020-04-27 DIAGNOSIS — I10 Essential (primary) hypertension: Secondary | ICD-10-CM | POA: Diagnosis not present

## 2020-04-27 DIAGNOSIS — Z6841 Body Mass Index (BMI) 40.0 and over, adult: Secondary | ICD-10-CM

## 2020-04-27 DIAGNOSIS — R635 Abnormal weight gain: Secondary | ICD-10-CM

## 2020-04-27 MED ORDER — LOSARTAN POTASSIUM 25 MG PO TABS: 25.0000 mg | ORAL_TABLET | Freq: Every day | ORAL | 1 refills | Status: DC

## 2020-04-27 NOTE — Progress Notes (Signed)
BMI > 40.  He has been on [facility] for a little over 2 months.  The last weight that we have on file was from April was 255.  He reports that that was his starting weight at home in June as well.  He is down 3 pounds to 252.  Starting BMI: 42 Current BMI: 41.9 Previous weight: 255 pounds Current weight: 10 and 52 pounds Change in weight: Down 3 pounds Dietary goal: Should be working on limiting carbohydrate intake.  Follow-up: 1 month    Hypertension-uncontrolled.  Unclear what side effect he was having on lisinopril so we need to clarify that to see if it needs to be added to his allergy or intolerance list.  I will send over a different prescription in its place as his blood pressure is very uncontrolled.  Nani Gasser, MD

## 2020-04-27 NOTE — Progress Notes (Signed)
Patient is here for weight check for [facility] medication refill. Patient states he stopped taking Lisinopril 10 mg 3 weeks ago due to side effects. Patient was advised Dr. Linford Arnold will be notified about this matter, and to schedule a follow up appointment for fasting labs and medication discussion.

## 2020-04-30 ENCOUNTER — Ambulatory Visit (INDEPENDENT_AMBULATORY_CARE_PROVIDER_SITE_OTHER): Payer: BC Managed Care – PPO | Admitting: Family Medicine

## 2020-04-30 ENCOUNTER — Encounter: Payer: Self-pay | Admitting: Family Medicine

## 2020-04-30 VITALS — BP 126/80 | HR 79 | Ht 65.0 in | Wt 246.0 lb

## 2020-04-30 DIAGNOSIS — T380X5D Adverse effect of glucocorticoids and synthetic analogues, subsequent encounter: Secondary | ICD-10-CM

## 2020-04-30 DIAGNOSIS — T50905A Adverse effect of unspecified drugs, medicaments and biological substances, initial encounter: Secondary | ICD-10-CM | POA: Diagnosis not present

## 2020-04-30 DIAGNOSIS — Z1211 Encounter for screening for malignant neoplasm of colon: Secondary | ICD-10-CM | POA: Diagnosis not present

## 2020-04-30 DIAGNOSIS — I10 Essential (primary) hypertension: Secondary | ICD-10-CM

## 2020-04-30 DIAGNOSIS — E099 Drug or chemical induced diabetes mellitus without complications: Secondary | ICD-10-CM

## 2020-04-30 DIAGNOSIS — R635 Abnormal weight gain: Secondary | ICD-10-CM

## 2020-04-30 DIAGNOSIS — Z6841 Body Mass Index (BMI) 40.0 and over, adult: Secondary | ICD-10-CM

## 2020-04-30 LAB — POCT GLYCOSYLATED HEMOGLOBIN (HGB A1C): Hemoglobin A1C: 6.3 % — AB (ref 4.0–5.6)

## 2020-04-30 MED ORDER — WEGOVY 0.5 MG/0.5ML ~~LOC~~ SOAJ
1.0000 mg | SUBCUTANEOUS | 1 refills | Status: DC
Start: 2020-04-30 — End: 2020-05-28

## 2020-04-30 NOTE — Assessment & Plan Note (Signed)
BP actually looks pretty good today.  Discussed options we will try losartan instead.  If that is not effective consider diuretic.

## 2020-04-30 NOTE — Progress Notes (Signed)
Established Patient Office Visit  Subjective:  Patient ID: Christian Gregory, male    DOB: Jan 16, 1971  Age: 49 y.o. MRN: [id number]  CC:  Chief Complaint  Patient presents with  . Diabetes  . Hypertension    HPI Christian Gregory presents for Hypertension- Pt denies chest pain, SOB, dizziness, or heart palpitations.  Taking meds as directed w/o problems.  Denies medication side effects.  Is having side effects on the lisinopril, ED.  No problems at 5 mg dose but after we increased him to 10 mg he started to notice symptoms..  Diabetes - no hypoglycemic events. No wounds or sores that are not healing well. No increased thirst or urination. Checking glucose at home. Taking medications as prescribed without any side effects.  Follow-up abnormal weight gain gain-has been on work IV for little over 2 months.  He was down about 3 pounds.  He says he has cut back on his soda so he is still drinking about 2/day but he has improved he is also been trying to decrease his carbohydrate intake.  He says he is still working on that.   Past Medical History:  Diagnosis Date  . Anxiety    anger problems  . Eosinophilic esophagitis   . Fatty liver   . History of methicillin resistant staphylococcus aureus (MRSA)   . Hypertension   . Obesity     Past Surgical History:  Procedure Laterality Date  . ESOPHAGEAL DILATION      Family History  Problem Relation Age of Onset  . Hypertension Mother        father  . Stroke Father     Social History   Socioeconomic History  . Marital status: Married    Spouse name: Not on file  . Number of children: Not on file  . Years of education: Not on file  . Highest education level: Not on file  Occupational History  . Not on file  Tobacco Use  . Smoking status: Never Smoker  . Smokeless tobacco: Never Used  Substance and Sexual Activity  . Alcohol use: Yes    Comment: 6 pack of beer 2-3 x a week  . Drug use: No  . Sexual activity: Yes  Other Topics  Concern  . Not on file  Social History Narrative  . Not on file   Social Determinants of Health   Financial Resource Strain:   . Difficulty of Paying Living Expenses: Not on file  Food Insecurity:   . Worried About Charity fundraiser in the Last Year: Not on file  . Ran Out of Food in the Last Year: Not on file  Transportation Needs:   . Lack of Transportation (Medical): Not on file  . Lack of Transportation (Non-Medical): Not on file  Physical Activity:   . Days of Exercise per Week: Not on file  . Minutes of Exercise per Session: Not on file  Stress:   . Feeling of Stress : Not on file  Social Connections:   . Frequency of Communication with Friends and Family: Not on file  . Frequency of Social Gatherings with Friends and Family: Not on file  . Attends Religious Services: Not on file  . Active Member of Clubs or Organizations: Not on file  . Attends Archivist Meetings: Not on file  . Marital Status: Not on file  Intimate Partner Violence:   . Fear of Current or Ex-Partner: Not on file  . Emotionally Abused: Not on  file  . Physically Abused: Not on file  . Sexually Abused: Not on file    Outpatient Medications Prior to Visit  Medication Sig Dispense Refill  . Accu-Chek FastClix Lancets MISC USE UP TO 4 TIMES A DAY FOR BLOOD SUGAR TESTING    . ACCU-CHEK GUIDE test strip USE AS DIRECTED TO TEST BLOOD SUGAR UP TO 4 TIMES A DAY 400 strip PRN  . albuterol (VENTOLIN HFA) 108 (90 Base) MCG/ACT inhaler TAKE 2 PUFFS BY MOUTH EVERY 6 HOURS AS NEEDED FOR WHEEZE OR SHORTNESS OF BREATH 8.5 g 2  . blood glucose meter kit and supplies KIT Dispense based on patient and insurance preference. Use up to four times daily as directed. (FOR ICD-9 250.00, 250.01). 1 each 0  . DULERA 200-5 MCG/ACT AERO Inhale 2 puffs into the lungs 2 (two) times a day.    . Insulin Pen Needle 32G X 4 MM MISC Use to inject into the skin daily 100 each prn  . NUCALA 100 MG/ML SOAJ     . predniSONE  (DELTASONE) 10 MG tablet Take 10 mg by mouth daily.    Marland Kitchen SPIRIVA RESPIMAT 2.5 MCG/ACT AERS Inhale 1 puff into the lungs daily.    Marland Kitchen testosterone cypionate (DEPOTESTOSTERONE CYPIONATE) 200 MG/ML injection INJECT 1 ML (200 MG TOTAL) INTO THE MUSCLE EVERY 14 (FOURTEEN) DAYS. 6 mL 0  . VYVANSE 40 MG capsule Take 40 mg by mouth at bedtime.    . WEGOVY 0.5 MG/0.5ML SOAJ INJECT 0.5 MG INTO THE SKIN EVERY 7 DAYS    . losartan (COZAAR) 25 MG tablet Take 1 tablet (25 mg total) by mouth daily. (Patient not taking: Reported on 04/30/2020) 30 tablet 1  . AMBULATORY NON FORMULARY MEDICATION Medication Name: Wegovy 0.66m SQ weekly 4 pen 0  . omeprazole (PRILOSEC) 40 MG capsule TAKE 1 CAPSULE BY MOUTH EVERY DAY 90 capsule 1  . Semaglutide-Weight Management (WEGOVY) 0.25 MG/0.5ML SOAJ Inject 0.5 mg into the skin every 7 (seven) days. 2 mL 0   No facility-administered medications prior to visit.    Allergies  Allergen Reactions  . Penicillins Anaphylaxis and Swelling    REACTION: anaphalatic    . Lisinopril Other (See Comments)    ED    ROS Review of Systems    Objective:    Physical Exam Constitutional:      Appearance: He is well-developed.  HENT:     Head: Normocephalic and atraumatic.  Cardiovascular:     Rate and Rhythm: Normal rate and regular rhythm.     Heart sounds: Normal heart sounds.  Pulmonary:     Effort: Pulmonary effort is normal.     Breath sounds: Normal breath sounds.  Skin:    General: Skin is warm and dry.  Neurological:     Mental Status: He is alert and oriented to person, place, and time.  Psychiatric:        Behavior: Behavior normal.     BP 126/80   Pulse 79   Ht _0  (1.651 m)   Wt 246 lb (111.6 kg)   SpO2 100%   BMI 40.94 kg/m  Wt Readings from Last 3 Encounters:  04/30/20 246 lb (111.6 kg)  04/27/20 252 lb (114.3 kg)  12/06/19 255 lb (115.7 kg)     Health Maintenance Due  Topic Date Due  . Hepatitis C Screening  Never done  . COLONOSCOPY   Never done    There are no preventive care reminders to display for this patient.  Lab Results  Component Value Date   TSH 2.542 09/02/2012   Lab Results  Component Value Date   WBC 6.2 06/09/2019   HGB 16.1 06/09/2019   HCT 47.2 06/09/2019   MCV 92.2 06/09/2019   PLT 340 06/09/2019   Lab Results  Component Value Date   NA 143 12/06/2019   K 4.9 12/06/2019   CO2 29 12/06/2019   GLUCOSE 151 (H) 12/06/2019   BUN 15 12/06/2019   CREATININE 1.03 12/06/2019   BILITOT 0.6 06/09/2019   ALKPHOS 58 01/05/2017   AST 24 06/09/2019   ALT 52 (H) 06/09/2019   PROT 6.8 06/09/2019   ALBUMIN 4.4 01/05/2017   CALCIUM 9.7 12/06/2019   Lab Results  Component Value Date   CHOL 223 (H) 06/09/2019   Lab Results  Component Value Date   HDL 45 06/09/2019   Lab Results  Component Value Date   LDLCALC 142 (H) 06/09/2019   Lab Results  Component Value Date   TRIG 216 (H) 06/09/2019   Lab Results  Component Value Date   CHOLHDL 5.0 (H) 06/09/2019   Lab Results  Component Value Date   HGBA1C 6.3 (A) 04/30/2020      Assessment & Plan:   Problem List Items Addressed This Visit      Cardiovascular and Mediastinum   ESSENTIAL HYPERTENSION, BENIGN - Primary    BP actually looks pretty good today.  Discussed options we will try losartan instead.  If that is not effective consider diuretic.        Endocrine   Steroid-induced diabetes ([place])    A1c still looks good stable.  Getting ready to start an ARB.  Currently on we will go IV for weight loss but also helping keep his A1c under good control.      Relevant Orders   POCT glycosylated hemoglobin (Hb A1C) (Completed)     Other   Morbid obesity with body mass index of 40.0-44.9 in adult [facility])    So far doing well with good go V.  Still working on some dietary changes as well as getting regular exercise but he does have some goals in mind and has been try to reduce carb intake.  We will go ahead and increase we will gave you to  1 mg weekly.  Follow-up in 4 weeks for nurse visit.      Relevant Medications   WEGOVY 0.5 MG/0.5ML SOAJ   Abnormal weight gain    The note below.       Other Visit Diagnoses    Screening for malignant neoplasm of colon       Relevant Orders   Ambulatory referral to Gastroenterology   Medication side effect, initial encounter          Meds ordered this encounter  Medications  . WEGOVY 0.5 MG/0.5ML SOAJ    Sig: Inject 1 mg into the skin every 7 (seven) days.    Dispense:  2 mL    Refill:  1    Follow-up: Return in about 4 weeks (around 05/28/2020) for Hypertension and fasting labs. Beatrice Lecher, MD

## 2020-04-30 NOTE — Assessment & Plan Note (Signed)
A1c still looks good stable.  Getting ready to start an ARB.  Currently on we will go IV for weight loss but also helping keep his A1c under good control.

## 2020-04-30 NOTE — Assessment & Plan Note (Signed)
The note below.

## 2020-04-30 NOTE — Patient Instructions (Signed)
Go ahead and try the losartan for the next 30 days and see which you think.  If it is a Nogo let me know.

## 2020-04-30 NOTE — Assessment & Plan Note (Signed)
So far doing well with good go V.  Still working on some dietary changes as well as getting regular exercise but he does have some goals in mind and has been try to reduce carb intake.  We will go ahead and increase we will gave you to 1 mg weekly.  Follow-up in 4 weeks for nurse visit.

## 2020-05-20 ENCOUNTER — Encounter: Payer: Self-pay | Admitting: Family Medicine

## 2020-05-21 ENCOUNTER — Other Ambulatory Visit: Payer: Self-pay

## 2020-05-21 ENCOUNTER — Ambulatory Visit (INDEPENDENT_AMBULATORY_CARE_PROVIDER_SITE_OTHER): Payer: BC Managed Care – PPO | Admitting: Physician Assistant

## 2020-05-21 ENCOUNTER — Encounter: Payer: Self-pay | Admitting: Physician Assistant

## 2020-05-21 VITALS — BP 170/90 | HR 78 | Ht 65.0 in | Wt 247.0 lb

## 2020-05-21 DIAGNOSIS — I10 Essential (primary) hypertension: Secondary | ICD-10-CM | POA: Diagnosis not present

## 2020-05-21 DIAGNOSIS — L0291 Cutaneous abscess, unspecified: Secondary | ICD-10-CM | POA: Diagnosis not present

## 2020-05-21 MED ORDER — CHLORHEXIDINE GLUCONATE 0.12 % MT SOLN: 15.0000 mL | Freq: Two times a day (BID) | OROMUCOSAL | 1 refills | Status: DC

## 2020-05-21 MED ORDER — HYDROCODONE-ACETAMINOPHEN 5-325 MG PO TABS: 1.0000 | ORAL_TABLET | Freq: Three times a day (TID) | ORAL | 0 refills | Status: AC | PRN

## 2020-05-21 MED ORDER — DOXYCYCLINE HYCLATE 100 MG PO TABS: 100.0000 mg | ORAL_TABLET | Freq: Two times a day (BID) | ORAL | 0 refills | Status: DC

## 2020-05-21 NOTE — Telephone Encounter (Signed)
Patient has appt

## 2020-05-21 NOTE — Patient Instructions (Signed)
Double cozaar daily. BP recheck in one week.

## 2020-05-21 NOTE — Progress Notes (Addendum)
Subjective:    Patient ID: Christian Gregory, male    DOB: Jun 29, 1971, 49 y.o.   MRN: [id number]  Pt is a 49 yo male with dental pain and swelling just above his front two teeth to the left fr the last 4-5 days.  He has had abscess before that needed I and D.  He states that he has had a fair bit of dental work done and believes this may be the cause of the infection source.  He felt like he had chills Saturday and felt fatigued but this resolved with drinking water. Patient says that the pain was so severe he had to take 1 hydrocodone last night, Sunday 05/20/2020, to be able to sleep. NSAIDs help minimally. Pain is worsening. He is now having more pressure on the left manible and sinus area.    .. Active Ambulatory Problems    Diagnosis Date Noted  . OBESITY 11/01/2007  . ESSENTIAL HYPERTENSION, BENIGN 11/01/2007  . ESOPHAGEAL STRICTURE 08/30/2007  . FATTY LIVER DISEASE 11/01/2007  . MALLET FINGER, ACQUIRED 05/28/2010  . Hypogonadism male 09/20/2012  . Steroid-induced diabetes (HCC) 02/08/2013  . Left lumbar radiculopathy 08/25/2013  . Mood disorder (HCC) 09/21/2013  . Separated shoulder 07/24/2014  . Carpal tunnel syndrome, bilateral 08/09/2014  . Hyperlipidemia 10/02/2015  . 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HCC) 01/26/2017  . Nephrolithiasis 03/31/2018  . Post-traumatic osteoarthritis of right ankle 01/14/2019  . Abnormal weight gain 02/06/2020  . Morbid obesity with body mass index of 40.0-44.9 in adult [facility]) 02/06/2020  . Abscess 05/21/2020  . Uncontrolled hypertension 05/21/2020   Resolved Ambulatory Problems    Diagnosis Date Noted  . Cellulitis and abscess of trunk 03/10/2010  . ANKLE SPRAIN, RIGHT 02/21/2010  . Contusion of lower leg 11/01/2009  . CELLULITIS AND ABSCESS OF LEG EXCEPT FOOT 08/23/2010  . Acute bronchitis 03/16/2015   Past Medical History:  Diagnosis Date  . Anxiety   . Eosinophilic esophagitis   . Fatty liver   . History of methicillin resistant  staphylococcus aureus (MRSA)   . Hypertension   . Obesity        Review of Systems  Constitutional: Positive for chills and fatigue. Negative for fever.  HENT: Positive for dental problem. Negative for drooling, rhinorrhea, sinus pressure, sinus pain, sore throat, trouble swallowing and voice change.   Respiratory: Negative.   Neurological: Negative for syncope, light-headedness and headaches.       Objective:   Physical Exam Constitutional:      Appearance: Normal appearance. He is obese.  HENT:     Mouth/Throat:     Mouth: Mucous membranes are moist.     Pharynx: Posterior oropharyngeal erythema present.     Comments: Small palpable abscess located on the gums by the left incisors and frenulum. Painful to the touch Cardiovascular:     Rate and Rhythm: Normal rate and regular rhythm.  Pulmonary:     Effort: Pulmonary effort is normal.     Breath sounds: Normal breath sounds.  Skin:    Capillary Refill: Capillary refill takes less than 2 seconds.  Neurological:     Mental Status: He is alert and oriented to person, place, and time.  Psychiatric:        Mood and Affect: Mood normal.        Behavior: Behavior normal.           Assessment & Plan:  .Marland KitchenBaraa was seen today for dental pain.  Diagnoses and all orders for this visit:  Abscess -  doxycycline (VIBRA-TABS) 100 MG tablet; Take 1 tablet (100 mg total) by mouth 2 (two) times daily. -     chlorhexidine (PERIDEX) 0.12 % solution; Use as directed 15 mLs in the mouth or throat 2 (two) times daily. For 3 days then once a week. -     HYDROcodone-acetaminophen (NORCO/VICODIN) 5-325 MG tablet; Take 1 tablet by mouth every 8 (eight) hours as needed for up to 5 days for moderate pain.  Uncontrolled hypertension  Incision and Drainage Procedure Note  Pre-operative Diagnosis: Oral abscess  Post-operative Diagnosis: same  Indications: Pain at site of lesion  Anesthesia: 1% plain lidocaine  Procedure Details   The procedure, risks and complications have been discussed in detail (including, but not limited to airway compromise, infection, bleeding) with the patient, and the patient has signed consent to the procedure.  The skin was sterilely prepped and draped over the affected area in the usual fashion. After adequate local anesthesia, I&D with a #2cc syringe and 25g needle blade was performed on the left, upper, Oral mucosa near the upper incisor and frenulum. Purulent drainage: present The patient was observed until stable.  Findings: Small 1 cm abscess located on the patients gums near the left upper incisor and frenulum. Scant amount of purulent discharge removed.  EBL: minimal < 0.5 cc's  Drains: none  Condition: Tolerated procedure well   Complications: none.  Doxycycline given for abscess. peridex mouthwash for 3-4 days then once a month.  Follow up with any fever, chills, nausea, headache.  norco given small quantity for acute pain.  Marland Kitchen.PDMP reviewed during this encounter. No concerns.   Elevated BP could be due to pain/infection. Increase cozaar to 50mg  and follow up BP in 1-2 weeks. Discussed importance of BP control.   Marland Kitchen PA-C, have reviewed and agree with the above documentation in it's entirety.

## 2020-05-22 ENCOUNTER — Encounter: Payer: Self-pay | Admitting: Family Medicine

## 2020-05-22 ENCOUNTER — Other Ambulatory Visit: Payer: Self-pay | Admitting: Family Medicine

## 2020-05-22 DIAGNOSIS — I10 Essential (primary) hypertension: Secondary | ICD-10-CM

## 2020-05-22 MED ORDER — AMLODIPINE BESYLATE 5 MG PO TABS: 5.0000 mg | ORAL_TABLET | Freq: Every day | ORAL | 3 refills | Status: DC

## 2020-05-24 ENCOUNTER — Encounter: Payer: Self-pay | Admitting: Family Medicine

## 2020-05-24 DIAGNOSIS — Z6841 Body Mass Index (BMI) 40.0 and over, adult: Secondary | ICD-10-CM

## 2020-05-24 NOTE — Telephone Encounter (Signed)
Nutritionist referral pended.

## 2020-05-25 ENCOUNTER — Other Ambulatory Visit: Payer: Self-pay | Admitting: Family Medicine

## 2020-05-25 DIAGNOSIS — E099 Drug or chemical induced diabetes mellitus without complications: Secondary | ICD-10-CM

## 2020-05-25 NOTE — Telephone Encounter (Signed)
Ref placed.

## 2020-05-28 ENCOUNTER — Ambulatory Visit (INDEPENDENT_AMBULATORY_CARE_PROVIDER_SITE_OTHER): Payer: BC Managed Care – PPO | Admitting: Family Medicine

## 2020-05-28 ENCOUNTER — Ambulatory Visit: Payer: BC Managed Care – PPO | Admitting: Family Medicine

## 2020-05-28 ENCOUNTER — Telehealth: Payer: Self-pay | Admitting: Family Medicine

## 2020-05-28 ENCOUNTER — Encounter: Payer: Self-pay | Admitting: Family Medicine

## 2020-05-28 VITALS — BP 134/83 | HR 76 | Ht 65.0 in | Wt 247.0 lb

## 2020-05-28 DIAGNOSIS — Z125 Encounter for screening for malignant neoplasm of prostate: Secondary | ICD-10-CM

## 2020-05-28 DIAGNOSIS — E785 Hyperlipidemia, unspecified: Secondary | ICD-10-CM | POA: Diagnosis not present

## 2020-05-28 DIAGNOSIS — I10 Essential (primary) hypertension: Secondary | ICD-10-CM

## 2020-05-28 DIAGNOSIS — E291 Testicular hypofunction: Secondary | ICD-10-CM

## 2020-05-28 DIAGNOSIS — Z6841 Body Mass Index (BMI) 40.0 and over, adult: Secondary | ICD-10-CM

## 2020-05-28 MED ORDER — WEGOVY 1.7 MG/0.75ML ~~LOC~~ SOAJ: 1.7000 mg | SUBCUTANEOUS | 0 refills | Status: DC

## 2020-05-28 NOTE — Progress Notes (Signed)
Established Patient Office Visit  Subjective:  Patient ID: Christian Gregory, male    DOB: 1971-06-09  Age: 49 y.o. MRN: [id number]  CC:  Chief Complaint  Patient presents with  . Hypertension    HPI Christian Gregory presents for follow-up hypertension.  He was previously on lisinopril and had side effects including ED.  So we switched him to losartan he said it made him feel dizzy, lightheaded and "sick".  Sent in a prescription for amlodipine. He is tolerating it well.  He has been checking his blood pressure at home with a wrist cuff he says usually soon as he walks in the door he checks it and sometimes in the morning when he is rushing come with your he will check it and has been getting very elevated numbers.  He was seen by one of my partners about a week ago for a dental abscess.  He says he is feeling much better and doing much better overall.  Obesity/BMI 41-he says he has been doing well with the we will go V thus far he has not had any significant side effects or nausea he does feel like it is been curbing his appetite and making it a little easier to eat less.  He still trying to work on having more high-protein snacks at work and decreasing his carb intake he is cut back on alcohol.  He still drinks 1 soda a day.  He is interested in doing a nutrition consult.  Still struggling to do meal prep.  He does not have a lot of time currently.  Past Medical History:  Diagnosis Date  . Anxiety    anger problems  . Eosinophilic esophagitis   . Fatty liver   . History of methicillin resistant staphylococcus aureus (MRSA)   . Hypertension   . Obesity     Past Surgical History:  Procedure Laterality Date  . ESOPHAGEAL DILATION      Family History  Problem Relation Age of Onset  . Hypertension Mother        father  . Stroke Father     Social History   Socioeconomic History  . Marital status: Married    Spouse name: Not on file  . Number of children: Not on file  . Years of  education: Not on file  . Highest education level: Not on file  Occupational History  . Not on file  Tobacco Use  . Smoking status: Never Smoker  . Smokeless tobacco: Never Used  Substance and Sexual Activity  . Alcohol use: Yes    Comment: 6 pack of beer 2-3 x a week  . Drug use: No  . Sexual activity: Yes  Other Topics Concern  . Not on file  Social History Narrative  . Not on file   Social Determinants of Health   Financial Resource Strain:   . Difficulty of Paying Living Expenses: Not on file  Food Insecurity:   . Worried About Charity fundraiser in the Last Year: Not on file  . Ran Out of Food in the Last Year: Not on file  Transportation Needs:   . Lack of Transportation (Medical): Not on file  . Lack of Transportation (Non-Medical): Not on file  Physical Activity:   . Days of Exercise per Week: Not on file  . Minutes of Exercise per Session: Not on file  Stress:   . Feeling of Stress : Not on file  Social Connections:   . Frequency of  Communication with Friends and Family: Not on file  . Frequency of Social Gatherings with Friends and Family: Not on file  . Attends Religious Services: Not on file  . Active Member of Clubs or Organizations: Not on file  . Attends Archivist Meetings: Not on file  . Marital Status: Not on file  Intimate Partner Violence:   . Fear of Current or Ex-Partner: Not on file  . Emotionally Abused: Not on file  . Physically Abused: Not on file  . Sexually Abused: Not on file    Outpatient Medications Prior to Visit  Medication Sig Dispense Refill  . Accu-Chek FastClix Lancets MISC USE UP TO 4 TIMES A DAY FOR BLOOD SUGAR TESTING    . ACCU-CHEK GUIDE test strip USE AS DIRECTED TO TEST BLOOD SUGAR UP TO 4 TIMES A DAY 400 strip PRN  . albuterol (VENTOLIN HFA) 108 (90 Base) MCG/ACT inhaler TAKE 2 PUFFS BY MOUTH EVERY 6 HOURS AS NEEDED FOR WHEEZE OR SHORTNESS OF BREATH 8.5 g 2  . amLODipine (NORVASC) 5 MG tablet Take 1 tablet (5 mg  total) by mouth daily. 30 tablet 3  . blood glucose meter kit and supplies KIT Dispense based on patient and insurance preference. Use up to four times daily as directed. (FOR ICD-9 250.00, 250.01). 1 each 0  . chlorhexidine (PERIDEX) 0.12 % solution Use as directed 15 mLs in the mouth or throat 2 (two) times daily. For 3 days then once a week. 473 mL 1  . doxycycline (VIBRA-TABS) 100 MG tablet Take 1 tablet (100 mg total) by mouth 2 (two) times daily. 20 tablet 0  . DULERA 200-5 MCG/ACT AERO Inhale 2 puffs into the lungs 2 (two) times a day.    . Insulin Pen Needle 32G X 4 MM MISC Use to inject into the skin daily 100 each prn  . NUCALA 100 MG/ML SOAJ     . SPIRIVA RESPIMAT 2.5 MCG/ACT AERS Inhale 1 puff into the lungs daily.    Marland Kitchen testosterone cypionate (DEPOTESTOSTERONE CYPIONATE) 200 MG/ML injection INJECT 1 ML (200 MG TOTAL) INTO THE MUSCLE EVERY 14 (FOURTEEN) DAYS. 6 mL 0  . VYVANSE 40 MG capsule Take 40 mg by mouth at bedtime.    . WEGOVY 0.5 MG/0.5ML SOAJ Inject 1 mg into the skin every 7 (seven) days. 2 mL 1   No facility-administered medications prior to visit.    Allergies  Allergen Reactions  . Penicillins Anaphylaxis and Swelling    REACTION: anaphalatic    . Lisinopril Other (See Comments)    ED  . Losartan Other (See Comments)    Dizziness and leg pain    ROS Review of Systems    Objective:    Physical Exam Constitutional:      Appearance: He is well-developed.  HENT:     Head: Normocephalic and atraumatic.  Cardiovascular:     Rate and Rhythm: Normal rate and regular rhythm.     Heart sounds: Normal heart sounds.  Pulmonary:     Effort: Pulmonary effort is normal.     Breath sounds: Normal breath sounds.  Skin:    General: Skin is warm and dry.  Neurological:     Mental Status: He is alert and oriented to person, place, and time.  Psychiatric:        Behavior: Behavior normal.     BP 134/83   Pulse 76   Ht 5' 5"  (1.651 m)   Wt 247 lb (112 kg)  SpO2 99%   BMI 41.10 kg/m  Wt Readings from Last 3 Encounters:  05/28/20 247 lb (112 kg)  05/21/20 247 lb (112 kg)  04/30/20 246 lb (111.6 kg)     Health Maintenance Due  Topic Date Due  . Hepatitis C Screening  Never done  . URINE MICROALBUMIN  Never done  . COLONOSCOPY  Never done    There are no preventive care reminders to display for this patient.  Lab Results  Component Value Date   TSH 2.542 09/02/2012   Lab Results  Component Value Date   WBC 6.2 06/09/2019   HGB 16.1 06/09/2019   HCT 47.2 06/09/2019   MCV 92.2 06/09/2019   PLT 340 06/09/2019   Lab Results  Component Value Date   NA 143 12/06/2019   K 4.9 12/06/2019   CO2 29 12/06/2019   GLUCOSE 151 (H) 12/06/2019   BUN 15 12/06/2019   CREATININE 1.03 12/06/2019   BILITOT 0.6 06/09/2019   ALKPHOS 58 01/05/2017   AST 24 06/09/2019   ALT 52 (H) 06/09/2019   PROT 6.8 06/09/2019   ALBUMIN 4.4 01/05/2017   CALCIUM 9.7 12/06/2019   Lab Results  Component Value Date   CHOL 223 (H) 06/09/2019   Lab Results  Component Value Date   HDL 45 06/09/2019   Lab Results  Component Value Date   LDLCALC 142 (H) 06/09/2019   Lab Results  Component Value Date   TRIG 216 (H) 06/09/2019   Lab Results  Component Value Date   CHOLHDL 5.0 (H) 06/09/2019   Lab Results  Component Value Date   HGBA1C 6.3 (A) 04/30/2020      Assessment & Plan:   Problem List Items Addressed This Visit      Cardiovascular and Mediastinum   ESSENTIAL HYPERTENSION, BENIGN - Primary    Blood pressure looks great today on current regimen.  I would like to see it just a little bit lower with a systolic less than 569.  But again continue to work on weight loss I think that will make a big difference for now we will continue with amlodipine.  Losartan added to intolerance list.      Relevant Orders   Lipid Panel w/reflex Direct LDL   COMPLETE METABOLIC PANEL WITH GFR   PSA   Testosterone Total,Free,Bio, Males   CBC      Endocrine   Hypogonadism male   Relevant Orders   Lipid Panel w/reflex Direct LDL   COMPLETE METABOLIC PANEL WITH GFR   PSA   Testosterone Total,Free,Bio, Males   CBC     Other   Morbid obesity with body mass index of 40.0-44.9 in adult [facility])    He is doing well on the [place]. Up to 22m dose and tolerating well. Increase to 1.720min about a week when complete the 95m30mose for total of 4 week.  If he has any problems or nausea then we may need to back down.  Nutrition referral was placed on December 18 he does not hear back from them by the end of this week he can give us Kore[place] back.      Relevant Medications   Semaglutide-Weight Management (WEGOVY) 1.7 MG/0.75ML SOAJ   Hyperlipidemia   Relevant Orders   Lipid Panel w/reflex Direct LDL   COMPLETE METABOLIC PANEL WITH GFR   PSA   Testosterone Total,Free,Bio, Males   CBC    Other Visit Diagnoses    Screening PSA (prostate specific antigen)  Relevant Orders   Lipid Panel w/reflex Direct LDL   COMPLETE METABOLIC PANEL WITH GFR   PSA   Testosterone Total,Free,Bio, Males   CBC   BMI 40.0-44.9, adult (HCC)       Relevant Medications   Semaglutide-Weight Management (WEGOVY) 1.7 MG/0.75ML SOAJ      Meds ordered this encounter  Medications  . Semaglutide-Weight Management (WEGOVY) 1.7 MG/0.75ML SOAJ    Sig: Inject 1.7 mg into the skin every 7 (seven) days.    Dispense:  3 mL    Refill:  0    Follow-up: Return in about 4 weeks (around 06/25/2020) for nurse visit for BP and weight.Beatrice Lecher, MD

## 2020-05-28 NOTE — Telephone Encounter (Signed)
Arline Asp,  We were able to add on the Dx for diabetes for him so please review and send back to nutrition. Thank you

## 2020-05-28 NOTE — Assessment & Plan Note (Signed)
He is doing well on the [facility]. Up to 1mg  dose and tolerating well. Increase to 1.7mg  in about a week when complete the 1mg  dose for total of 4 week.  If he has any problems or nausea then we may need to back down.  Nutrition referral was placed on December 18 he does not hear back from them by the end of this week he can give call back.

## 2020-05-28 NOTE — Assessment & Plan Note (Addendum)
Blood pressure looks great today on current regimen.  I would like to see it just a little bit lower with a systolic less than 130.  But again continue to work on weight loss I think that will make a big difference for now we will continue with amlodipine.  Losartan added to intolerance list.

## 2020-05-28 NOTE — Telephone Encounter (Signed)
Dr. Harvie Bridge from [facility] called and stated the diagnosis on Mr. Gracia referral could be used as a second diagnosis but he would need to have some other diagnosis as primary so something like, Diabetes or Obesity. Please correct referral and I will send so they can schedule patient.   Arline Asp

## 2020-05-29 NOTE — Telephone Encounter (Signed)
Sending referral with new diagnosis to [facility] they will call and schedule with patient for good appointment time - CF

## 2020-05-31 ENCOUNTER — Encounter: Payer: Self-pay | Admitting: Family Medicine

## 2020-06-01 MED ORDER — METOPROLOL SUCCINATE ER 25 MG PO TB24: 25.0000 mg | ORAL_TABLET | Freq: Every day | ORAL | 2 refills | Status: DC

## 2020-06-04 NOTE — Telephone Encounter (Signed)
Labs order so we can recheck testosterone

## 2020-06-11 DIAGNOSIS — F902 Attention-deficit hyperactivity disorder, combined type: Secondary | ICD-10-CM | POA: Diagnosis not present

## 2020-06-11 DIAGNOSIS — Z79899 Other long term (current) drug therapy: Secondary | ICD-10-CM | POA: Diagnosis not present

## 2020-06-25 ENCOUNTER — Ambulatory Visit: Payer: BC Managed Care – PPO

## 2020-06-27 DIAGNOSIS — Z125 Encounter for screening for malignant neoplasm of prostate: Secondary | ICD-10-CM | POA: Diagnosis not present

## 2020-06-27 DIAGNOSIS — E291 Testicular hypofunction: Secondary | ICD-10-CM | POA: Diagnosis not present

## 2020-06-27 DIAGNOSIS — E785 Hyperlipidemia, unspecified: Secondary | ICD-10-CM | POA: Diagnosis not present

## 2020-06-27 DIAGNOSIS — I10 Essential (primary) hypertension: Secondary | ICD-10-CM | POA: Diagnosis not present

## 2020-06-28 LAB — COMPLETE METABOLIC PANEL WITH GFR
AG Ratio: 1.8 (calc) (ref 1.0–2.5)
ALT: 45 U/L (ref 9–46)
AST: 20 U/L (ref 10–35)
Albumin: 4.4 g/dL (ref 3.6–5.1)
Alkaline phosphatase (APISO): 45 U/L (ref 35–144)
BUN: 14 mg/dL (ref 7–25)
CO2: 30 mmol/L (ref 20–32)
Calcium: 9.5 mg/dL (ref 8.6–10.3)
Chloride: 104 mmol/L (ref 98–110)
Creat: 1.07 mg/dL (ref 0.70–1.33)
GFR, Est African American: 93 mL/min/{1.73_m2} (ref >=60)
GFR, Est Non African American: 81 mL/min/{1.73_m2} (ref >=60)
Globulin: 2.4 g/dL (calc) (ref 1.9–3.7)
Glucose, Bld: 137 mg/dL — ABNORMAL HIGH (ref 65–99)
Potassium: 4.4 mmol/L (ref 3.5–5.3)
Sodium: 141 mmol/L (ref 135–146)
Total Bilirubin: 0.9 mg/dL (ref 0.2–1.2)
Total Protein: 6.8 g/dL (ref 6.1–8.1)

## 2020-06-28 LAB — LIPID PANEL W/REFLEX DIRECT LDL
Cholesterol: 196 mg/dL (ref <200)
HDL: 43 mg/dL (ref >=40)
LDL Cholesterol (Calc): 126 mg/dL (calc) — ABNORMAL HIGH
Non-HDL Cholesterol (Calc): 153 mg/dL (calc) — ABNORMAL HIGH (ref <130)
Total CHOL/HDL Ratio: 4.6 (calc) (ref <5.0)
Triglycerides: 159 mg/dL — ABNORMAL HIGH (ref <150)

## 2020-06-28 LAB — PSA: PSA: 0.49 ng/mL (ref <=4.0)

## 2020-06-28 LAB — TESTOSTERONE TOTAL,FREE,BIO, MALES
Albumin: 4.4 g/dL (ref 3.6–5.1)
Sex Hormone Binding: 17 nmol/L (ref 10–50)
Testosterone, Bioavailable: 600.5 ng/dL — ABNORMAL HIGH (ref >=110.0)
Testosterone, Free: 298.3 pg/mL — ABNORMAL HIGH (ref 46.0–224.0)
Testosterone: 1099 ng/dL — ABNORMAL HIGH (ref 250–827)

## 2020-06-28 LAB — CBC
HCT: 49.5 % (ref 38.5–50.0)
Hemoglobin: 17 g/dL (ref 13.2–17.1)
MCH: 31.3 pg (ref 27.0–33.0)
MCHC: 34.3 g/dL (ref 32.0–36.0)
MCV: 91.2 fL (ref 80.0–100.0)
MPV: 10.2 fL (ref 7.5–12.5)
Platelets: 284 10*3/uL (ref 140–400)
RBC: 5.43 10*6/uL (ref 4.20–5.80)
RDW: 12.1 % (ref 11.0–15.0)
WBC: 8.9 10*3/uL (ref 3.8–10.8)

## 2020-06-29 ENCOUNTER — Encounter: Payer: Self-pay | Admitting: Family Medicine

## 2020-06-29 ENCOUNTER — Ambulatory Visit (INDEPENDENT_AMBULATORY_CARE_PROVIDER_SITE_OTHER): Payer: BC Managed Care – PPO | Admitting: Family Medicine

## 2020-06-29 VITALS — BP 147/77 | HR 74 | Ht 65.0 in | Wt 240.0 lb

## 2020-06-29 DIAGNOSIS — Z6839 Body mass index (BMI) 39.0-39.9, adult: Secondary | ICD-10-CM

## 2020-06-29 DIAGNOSIS — I10 Essential (primary) hypertension: Secondary | ICD-10-CM

## 2020-06-29 NOTE — Progress Notes (Signed)
Pt is down 7lbs. Bp is slightly elevated this afternoon.  He stated that he rushed to get here and he had to rush out to drive to [place] to pick a car up so I was unable to do a 10 min recheck.  I advised him to keep an eye on his pressures at home.  He stated that he bought a cuff and has been checking them and they average 130s/70s.

## 2020-06-29 NOTE — Progress Notes (Signed)
Agree with documentation as above.  Needs 1 month follow-up.  Please schedule with me.  Is doing a fantastic job.  Nani Gasser, MD.

## 2020-07-03 ENCOUNTER — Encounter: Payer: Self-pay | Admitting: Family Medicine

## 2020-07-04 NOTE — Telephone Encounter (Signed)
Wegovy???? Does he want to go up to the next dose or stay on same dose?

## 2020-07-05 ENCOUNTER — Other Ambulatory Visit: Payer: Self-pay | Admitting: *Deleted

## 2020-07-05 DIAGNOSIS — Z6839 Body mass index (BMI) 39.0-39.9, adult: Secondary | ICD-10-CM

## 2020-07-05 MED ORDER — WEGOVY 2.4 MG/0.75ML ~~LOC~~ SOAJ: 2.4000 mg | SUBCUTANEOUS | 0 refills | Status: DC

## 2020-07-05 NOTE — Telephone Encounter (Signed)
Pt called. He wants to go up to the next dose.  Please send script to [facility].  Thank you.

## 2020-07-05 NOTE — Telephone Encounter (Signed)
Pt would like to move up to the 2.4mg .

## 2020-07-31 ENCOUNTER — Other Ambulatory Visit: Payer: Self-pay | Admitting: Family Medicine

## 2020-07-31 DIAGNOSIS — I10 Essential (primary) hypertension: Secondary | ICD-10-CM

## 2020-08-02 ENCOUNTER — Encounter: Payer: Self-pay | Admitting: Family Medicine

## 2020-08-02 ENCOUNTER — Other Ambulatory Visit: Payer: Self-pay | Admitting: Family Medicine

## 2020-08-02 MED ORDER — TESTOSTERONE CYPIONATE 200 MG/ML IM SOLN
200.0000 mg | INTRAMUSCULAR | 0 refills | Status: DC
Start: 2020-08-02 — End: 2020-12-27

## 2020-08-02 NOTE — Telephone Encounter (Signed)
Rx renewed

## 2020-08-02 NOTE — Telephone Encounter (Signed)
[place] patient Last written 03/14/2020 #6 mL no refills Last appt 06/29/2020 Labs up to date - but was high last check

## 2020-08-13 ENCOUNTER — Encounter: Payer: Self-pay | Admitting: Family Medicine

## 2020-08-13 DIAGNOSIS — Z6839 Body mass index (BMI) 39.0-39.9, adult: Secondary | ICD-10-CM

## 2020-08-13 MED ORDER — WEGOVY 2.4 MG/0.75ML ~~LOC~~ SOAJ: 2.4000 mg | SUBCUTANEOUS | 0 refills | Status: DC

## 2020-08-13 NOTE — Telephone Encounter (Signed)
RF sent. Needs to schedule a January followup

## 2020-08-14 NOTE — Telephone Encounter (Signed)
I called pt and left him a VM letting him know refill was sent and to schedule a January appt for a f/u with [facility]

## 2020-08-19 ENCOUNTER — Other Ambulatory Visit: Payer: Self-pay | Admitting: Family Medicine

## 2020-08-26 ENCOUNTER — Other Ambulatory Visit: Payer: Self-pay | Admitting: Family Medicine

## 2020-08-27 ENCOUNTER — Other Ambulatory Visit: Payer: Self-pay | Admitting: Family Medicine

## 2020-08-27 DIAGNOSIS — K2100 Gastro-esophageal reflux disease with esophagitis, without bleeding: Secondary | ICD-10-CM

## 2020-08-31 ENCOUNTER — Other Ambulatory Visit: Payer: Self-pay

## 2020-08-31 ENCOUNTER — Encounter: Payer: Self-pay | Admitting: Family Medicine

## 2020-08-31 ENCOUNTER — Ambulatory Visit (INDEPENDENT_AMBULATORY_CARE_PROVIDER_SITE_OTHER): Payer: BC Managed Care – PPO | Admitting: Family Medicine

## 2020-08-31 VITALS — BP 142/72 | HR 99 | Ht 65.0 in | Wt 237.0 lb

## 2020-08-31 DIAGNOSIS — E099 Drug or chemical induced diabetes mellitus without complications: Secondary | ICD-10-CM | POA: Diagnosis not present

## 2020-08-31 DIAGNOSIS — T380X5D Adverse effect of glucocorticoids and synthetic analogues, subsequent encounter: Secondary | ICD-10-CM | POA: Diagnosis not present

## 2020-08-31 DIAGNOSIS — R635 Abnormal weight gain: Secondary | ICD-10-CM

## 2020-08-31 DIAGNOSIS — Z6839 Body mass index (BMI) 39.0-39.9, adult: Secondary | ICD-10-CM | POA: Diagnosis not present

## 2020-08-31 DIAGNOSIS — I10 Essential (primary) hypertension: Secondary | ICD-10-CM

## 2020-08-31 DIAGNOSIS — R454 Irritability and anger: Secondary | ICD-10-CM

## 2020-08-31 LAB — POCT GLYCOSYLATED HEMOGLOBIN (HGB A1C): Hemoglobin A1C: 5.5 % (ref 4.0–5.6)

## 2020-08-31 MED ORDER — WEGOVY 2.4 MG/0.75ML ~~LOC~~ SOAJ: 2.4000 mg | SUBCUTANEOUS | 1 refills | Status: DC

## 2020-08-31 MED ORDER — BUPROPION HCL ER (XL) 150 MG PO TB24: 150.0000 mg | ORAL_TABLET | Freq: Every day | ORAL | 1 refills | Status: DC

## 2020-08-31 NOTE — Assessment & Plan Note (Signed)
Irritability-he is open to restarting medication he did mention Xanax but I really want to stay away from that is much as possible because of its habit-forming nature.  We discussed a trial of Wellbutrin discussed that it has mild effects it does not cause erectile dysfunction and should not impact his weight negatively.  Plan to start medication and follow-up in 6 weeks so that we can make sure that he is doing well and make any adjustments needed.

## 2020-08-31 NOTE — Assessment & Plan Note (Signed)
Current weight: 237 pounds Previous weight: 240 pounds Change in weight: Down 3 pounds Goal weight: 180 lbs.  Nutrition goals: Cut back to 1 soda per day, continue to work on cutting back on sugary foods, increasing vegetable intake. Exercise goals: No additional goals set today. Medication: We will go V2 0.4 mg weekly Follow-up: 6 weeks

## 2020-08-31 NOTE — Assessment & Plan Note (Signed)
Blood pressure elevated today did encourage him to go ahead and start the new blood pressure pill if he has any problems please let us know otherwise we will recheck pressure when he follows up in 6 weeks.

## 2020-08-31 NOTE — Assessment & Plan Note (Signed)
A1c down to 5.5 today on the [facility] so it looks absolutely fantastic.  Continue current regimen.  New prescription sent to pharmacy.  Again follow-up in 6 weeks for weight loss as well.

## 2020-08-31 NOTE — Progress Notes (Signed)
Established Patient Office Visit  Subjective:  Patient ID: Christian Gregory, male    DOB: [birth year]  Age: 50 y.o. MRN: [id number]  CC:  Chief Complaint  Patient presents with  . Weight Check    HPI [name] presents for follow-up abnormal weight gain-he has lost about 3 more pounds since he was last here.  He feels like overall he is actually doing really well in the [facility].  He has had to take some prednisone on and off recently for sinus symptoms and cough.  He knows that it causes him to want to eat a little bit more.  He still trying to work on some dietary changes he is really 1 to cut down to about 1 soda per day he is really tried hard to cut back on sugary foods.  Also been trying to portion control.  He rarely drinks alcohol.  Impaired fasting glucose-no increased thirst or urination. No symptoms consistent with hypoglycemia.  Hypertension follow-up-he says he never did start the new blood pressure pill he did not realize that he had not started it but did pick it up a couple of days ago and plans to.  He also wanted to discuss his mood today he feels like he is just been much more irritable especially with his son and his wife it is creating a lot of tension in the house.  His wife is going through some things as well emotionally.  So this is created a lot of stress he said he was on Zoloft years ago and did well on it but really does not want to be dependent on it he says he tried a couple different SSRIs and they tend to cause erectile dysfunction.  Past Medical History:  Diagnosis Date  . Anxiety    anger problems  . Eosinophilic esophagitis   . Fatty liver   . History of methicillin resistant staphylococcus aureus (MRSA)   . Hypertension   . Obesity     Past Surgical History:  Procedure Laterality Date  . ESOPHAGEAL DILATION      Family History  Problem Relation Age of Onset  . Hypertension Mother        father  . Stroke Father     Social History    Socioeconomic History  . Marital status: Married    Spouse name: Not on file  . Number of children: Not on file  . Years of education: Not on file  . Highest education level: Not on file  Occupational History  . Not on file  Tobacco Use  . Smoking status: Never Smoker  . Smokeless tobacco: Never Used  Substance and Sexual Activity  . Alcohol use: Yes    Comment: 6 pack of beer 2-3 x a week  . Drug use: No  . Sexual activity: Yes  Other Topics Concern  . Not on file  Social History Narrative  . Not on file   Social Determinants of Health   Financial Resource Strain: Not on file  Food Insecurity: Not on file  Transportation Needs: Not on file  Physical Activity: Not on file  Stress: Not on file  Social Connections: Not on file  Intimate Partner Violence: Not on file    Outpatient Medications Prior to Visit  Medication Sig Dispense Refill  . albuterol (VENTOLIN HFA) 108 (90 Base) MCG/ACT inhaler TAKE 2 PUFFS BY MOUTH EVERY 6 HOURS AS NEEDED FOR WHEEZE OR SHORTNESS OF BREATH 8.5 each 2  . amLODipine (NORVASC)  5 MG tablet TAKE 1 TABLET BY MOUTH EVERY DAY 90 tablet 1  . DULERA 200-5 MCG/ACT AERO Inhale 2 puffs into the lungs 2 (two) times a day.    . Insulin Pen Needle 32G X 4 MM MISC Use to inject into the skin daily 100 each prn  . metoprolol succinate (TOPROL-XL) 25 MG 24 hr tablet TAKE ONE TAB BY MOUTH DAILY 90 tablet 0  . NUCALA 100 MG/ML SOAJ     . testosterone cypionate (DEPOTESTOSTERONE CYPIONATE) 200 MG/ML injection Inject 1 mL (200 mg total) into the muscle every 14 (fourteen) days. 6 mL 0  . VYVANSE 40 MG capsule Take 40 mg by mouth at bedtime.    . Semaglutide-Weight Management (WEGOVY) 2.4 MG/0.75ML SOAJ Inject 2.4 mg into the skin once a week. 3 mL 0  . SPIRIVA RESPIMAT 2.5 MCG/ACT AERS Inhale 1 puff into the lungs daily.    . Accu-Chek FastClix Lancets MISC USE UP TO 4 TIMES A DAY FOR BLOOD SUGAR TESTING    . ACCU-CHEK GUIDE test strip USE AS DIRECTED TO TEST  BLOOD SUGAR UP TO 4 TIMES A DAY 400 strip PRN  . blood glucose meter kit and supplies KIT Dispense based on patient and insurance preference. Use up to four times daily as directed. (FOR ICD-9 250.00, 250.01). 1 each 0  . chlorhexidine (PERIDEX) 0.12 % solution Use as directed 15 mLs in the mouth or throat 2 (two) times daily. For 3 days then once a week. 473 mL 1   No facility-administered medications prior to visit.    Allergies  Allergen Reactions  . Penicillins Anaphylaxis and Swelling    REACTION: anaphalatic    . Lisinopril Other (See Comments)    ED  . Losartan Other (See Comments)    Dizziness and leg pain    ROS Review of Systems    Objective:    Physical Exam  BP (!) 142/72   Pulse 99   Ht 5' 5"  (1.651 m)   Wt 237 lb (107.5 kg)   SpO2 99%   BMI 39.44 kg/m  Wt Readings from Last 3 Encounters:  08/31/20 237 lb (107.5 kg)  06/29/20 240 lb (108.9 kg)  05/28/20 247 lb (112 kg)     Health Maintenance Due  Topic Date Due  . Hepatitis C Screening  Never done  . COVID-19 Vaccine (1) Never done  . URINE MICROALBUMIN  Never done  . COLONOSCOPY (Pts 45-67yr Insurance coverage will need to be confirmed)  Never done    There are no preventive care reminders to display for this patient.  Lab Results  Component Value Date   TSH 2.542 09/02/2012   Lab Results  Component Value Date   WBC 8.9 06/27/2020   HGB 17.0 06/27/2020   HCT 49.5 06/27/2020   MCV 91.2 06/27/2020   PLT 284 06/27/2020   Lab Results  Component Value Date   NA 141 06/27/2020   K 4.4 06/27/2020   CO2 30 06/27/2020   GLUCOSE 137 (H) 06/27/2020   BUN 14 06/27/2020   CREATININE 1.07 06/27/2020   BILITOT 0.9 06/27/2020   ALKPHOS 58 01/05/2017   AST 20 06/27/2020   ALT 45 06/27/2020   PROT 6.8 06/27/2020   ALBUMIN 4.4 01/05/2017   CALCIUM 9.5 06/27/2020   Lab Results  Component Value Date   CHOL 196 06/27/2020   Lab Results  Component Value Date   HDL 43 06/27/2020   Lab  Results  Component Value Date  [place] 126 (H) 06/27/2020   Lab Results  Component Value Date   TRIG 159 (H) 06/27/2020   Lab Results  Component Value Date   CHOLHDL 4.6 06/27/2020   Lab Results  Component Value Date   HGBA1C 5.5 08/31/2020      Assessment & Plan:   Problem List Items Addressed This Visit      Cardiovascular and Mediastinum   Uncontrolled hypertension   ESSENTIAL HYPERTENSION, BENIGN    Blood pressure elevated today did encourage him to go ahead and start the new blood pressure pill if he has any problems please let us know otherwise we will recheck pressure when he follows up in 6 weeks.        Endocrine   Steroid-induced diabetes ([place])    A1c down to 5.5 today on the [facility] so it looks absolutely fantastic.  Continue current regimen.  New prescription sent to pharmacy.  Again follow-up in 6 weeks for weight loss as well.      Relevant Orders   POCT glycosylated hemoglobin (Hb A1C) (Completed)     Other   Irritability    Irritability-he is open to restarting medication he did mention Xanax but I really want to stay away from that is much as possible because of its habit-forming nature.  We discussed a trial of Wellbutrin discussed that it has mild effects it does not cause erectile dysfunction and should not impact his weight negatively.  Plan to start medication and follow-up in 6 weeks so that we can make sure that he is doing well and make any adjustments needed.      Abnormal weight gain - Primary    Current weight: 237 pounds Previous weight: 240 pounds Change in weight: Down 3 pounds Goal weight: 180 lbs.  Nutrition goals: Cut back to 1 soda per day, continue to work on cutting back on sugary foods, increasing vegetable intake. Exercise goals: No additional goals set today. Medication: We will go V2 0.4 mg weekly Follow-up: 6 weeks       Other Visit Diagnoses    BMI 39.0-39.9,adult       Relevant Medications   Semaglutide-Weight  Management (WEGOVY) 2.4 MG/0.75ML SOAJ      Meds ordered this encounter  Medications  . Semaglutide-Weight Management (WEGOVY) 2.4 MG/0.75ML SOAJ    Sig: Inject 2.4 mg into the skin once a week.    Dispense:  3 mL    Refill:  1  . buPROPion (WELLBUTRIN XL) 150 MG 24 hr tablet    Sig: Take 1 tablet (150 mg total) by mouth daily.    Dispense:  30 tablet    Refill:  1    Follow-up: Return in about 6 weeks (around 10/12/2020) for  New start medication.    Beatrice Lecher, MD

## 2020-09-11 ENCOUNTER — Encounter: Payer: Self-pay | Admitting: Family Medicine

## 2020-09-13 DIAGNOSIS — Z79899 Other long term (current) drug therapy: Secondary | ICD-10-CM | POA: Diagnosis not present

## 2020-09-13 DIAGNOSIS — F902 Attention-deficit hyperactivity disorder, combined type: Secondary | ICD-10-CM | POA: Diagnosis not present

## 2020-09-18 ENCOUNTER — Other Ambulatory Visit: Payer: Self-pay | Admitting: Family Medicine

## 2020-09-18 DIAGNOSIS — I10 Essential (primary) hypertension: Secondary | ICD-10-CM

## 2020-09-18 DIAGNOSIS — K2100 Gastro-esophageal reflux disease with esophagitis, without bleeding: Secondary | ICD-10-CM

## 2020-09-21 ENCOUNTER — Telehealth: Payer: Self-pay | Admitting: Family Medicine

## 2020-09-21 ENCOUNTER — Other Ambulatory Visit: Payer: Self-pay | Admitting: Family Medicine

## 2020-09-21 ENCOUNTER — Encounter: Payer: Self-pay | Admitting: Family Medicine

## 2020-09-21 DIAGNOSIS — E099 Drug or chemical induced diabetes mellitus without complications: Secondary | ICD-10-CM

## 2020-09-21 DIAGNOSIS — T380X5D Adverse effect of glucocorticoids and synthetic analogues, subsequent encounter: Secondary | ICD-10-CM

## 2020-09-21 MED ORDER — OZEMPIC (0.25 OR 0.5 MG/DOSE) 2 MG/1.5ML ~~LOC~~ SOPN: 0.2500 mg | PEN_INJECTOR | SUBCUTANEOUS | 2 refills | Status: DC

## 2020-09-21 NOTE — Telephone Encounter (Signed)
Sent new rx for Ozempic earlier today.

## 2020-09-21 NOTE — Telephone Encounter (Signed)
Please call patient we did receive official notification that the authorization was denied.  So we are going to try to send over Ozempic.  At the same chemical it is just technically used for diabetes versus used for weight loss and will see if they will cover that instead.

## 2020-09-24 ENCOUNTER — Other Ambulatory Visit: Payer: Self-pay | Admitting: Family Medicine

## 2020-09-24 DIAGNOSIS — T380X5D Adverse effect of glucocorticoids and synthetic analogues, subsequent encounter: Secondary | ICD-10-CM

## 2020-09-24 DIAGNOSIS — E099 Drug or chemical induced diabetes mellitus without complications: Secondary | ICD-10-CM

## 2020-09-25 NOTE — Telephone Encounter (Signed)
Do we have a list? I am assuming metformin?

## 2020-09-25 NOTE — Telephone Encounter (Signed)
The plan requires a step therapy for diabetes.

## 2020-09-25 NOTE — Telephone Encounter (Signed)
Yes he has diabetes.  Can you let the pharmacy know

## 2020-09-26 MED ORDER — METFORMIN HCL ER 500 MG PO TB24: 500.0000 mg | ORAL_TABLET | Freq: Every day | ORAL | 3 refills | Status: DC

## 2020-09-26 NOTE — Addendum Note (Signed)
Addended by: Nani Gasser D on: 09/26/2020 07:54 AM   Modules accepted: Orders

## 2020-09-26 NOTE — Telephone Encounter (Signed)
Metformin sent to pharmacy

## 2020-09-30 ENCOUNTER — Other Ambulatory Visit: Payer: Self-pay | Admitting: Family Medicine

## 2020-10-02 ENCOUNTER — Encounter: Payer: Self-pay | Admitting: Family Medicine

## 2020-10-12 ENCOUNTER — Other Ambulatory Visit: Payer: Self-pay

## 2020-10-12 ENCOUNTER — Ambulatory Visit (INDEPENDENT_AMBULATORY_CARE_PROVIDER_SITE_OTHER): Payer: BC Managed Care – PPO | Admitting: Family Medicine

## 2020-10-12 ENCOUNTER — Encounter: Payer: Self-pay | Admitting: Family Medicine

## 2020-10-12 VITALS — BP 126/81 | HR 64 | Ht 65.0 in | Wt 236.0 lb

## 2020-10-12 DIAGNOSIS — I7789 Other specified disorders of arteries and arterioles: Secondary | ICD-10-CM | POA: Diagnosis not present

## 2020-10-12 DIAGNOSIS — T380X5D Adverse effect of glucocorticoids and synthetic analogues, subsequent encounter: Secondary | ICD-10-CM | POA: Diagnosis not present

## 2020-10-12 DIAGNOSIS — F902 Attention-deficit hyperactivity disorder, combined type: Secondary | ICD-10-CM | POA: Diagnosis not present

## 2020-10-12 DIAGNOSIS — E099 Drug or chemical induced diabetes mellitus without complications: Secondary | ICD-10-CM

## 2020-10-12 DIAGNOSIS — D7218 Eosinophilia in diseases classified elsewhere: Secondary | ICD-10-CM

## 2020-10-12 MED ORDER — AMPHETAMINE-DEXTROAMPHET ER 30 MG PO CP24: 30.0000 mg | ORAL_CAPSULE | ORAL | 0 refills | Status: DC

## 2020-10-12 MED ORDER — SPIRIVA HANDIHALER 18 MCG IN CAPS: 18.0000 ug | ORAL_CAPSULE | Freq: Every day | RESPIRATORY_TRACT | 4 refills | Status: DC

## 2020-10-12 NOTE — Assessment & Plan Note (Signed)
Far doing well on Ozempic.  Plan to follow-up in 3 months.  Continue to work on [employer], portion control and regular exercise.

## 2020-10-12 NOTE — Assessment & Plan Note (Signed)
Refer him to pulmonology locally in [place] close to where he works.  To see if he could be restarted on the Nucala.  Interim I did send in a prescription for the Spiriva he has been using the capsules and disc.

## 2020-10-12 NOTE — Assessment & Plan Note (Signed)
Diagnosed at [facility].  Recently switched to Adderall 30 mg.  New prescription sent to pharmacy to be filled when due.  I will see him back in 3 months.

## 2020-10-12 NOTE — Progress Notes (Signed)
Acute Office Visit  Subjective:    Patient ID: Christian Gregory, male    DOB: [birth year], 50 y.o.   MRN: [id number]  No chief complaint on file.   HPI Patient is in today for Ozempic x 2 doses.  So far he has tolerated the medication well without any side effects he has not had any significant nausea it took several weeks actually get the medication approved which is why he just now started it.  Have a couple of concerns he would like to discuss as well.  Not go to be going back to [facility] for to see the pulmonologist he would like to find something here locally if possible.  He does need a refill on his Spiriva which was being prescribed by them as well as the Nucala.  His nasal congestion and pressure and drainage has really ramped up recently he has been off the new collar for a while and would like to get back on it.  He also wanted to know if I would be willing to take over his Adderall.  He was referred to [facility] in July 2021 and tested positive for ADHD.  He has been on Vyvanse most of the year but more recently was switched to Adderall 30 mg XR.  He wants know we would be able to take over that prescription for him without having to go to the specialist which has been expensive.  Past Medical History:  Diagnosis Date  . Anxiety    anger problems  . Eosinophilic esophagitis   . Fatty liver   . History of methicillin resistant staphylococcus aureus (MRSA)   . Hypertension   . Obesity     Past Surgical History:  Procedure Laterality Date  . ESOPHAGEAL DILATION      Family History  Problem Relation Age of Onset  . Hypertension Mother        father  . Stroke Father     Social History   Socioeconomic History  . Marital status: Married    Spouse name: Not on file  . Number of children: Not on file  . Years of education: Not on file  . Highest education level: Not on file  Occupational History  . Not on file  Tobacco Use  . Smoking status: Never  Smoker  . Smokeless tobacco: Never Used  Substance and Sexual Activity  . Alcohol use: Yes    Comment: 6 pack of beer 2-3 x a week  . Drug use: No  . Sexual activity: Yes  Other Topics Concern  . Not on file  Social History Narrative  . Not on file   Social Determinants of Health   Financial Resource Strain: Not on file  Food Insecurity: Not on file  Transportation Needs: Not on file  Physical Activity: Not on file  Stress: Not on file  Social Connections: Not on file  Intimate Partner Violence: Not on file    Outpatient Medications Prior to Visit  Medication Sig Dispense Refill  . amphetamine-dextroamphetamine (ADDERALL XR) 30 MG 24 hr capsule Take 30 mg by mouth daily.    Marland Kitchen albuterol (VENTOLIN HFA) 108 (90 Base) MCG/ACT inhaler TAKE 2 PUFFS BY MOUTH EVERY 6 HOURS AS NEEDED FOR WHEEZE OR SHORTNESS OF BREATH 8.5 each 2  . amLODipine (NORVASC) 5 MG tablet TAKE 1 TABLET BY MOUTH EVERY DAY 90 tablet 1  . buPROPion (WELLBUTRIN XL) 150 MG 24 hr tablet Take 1 tablet (150 mg total) by mouth daily.  30 tablet 1  . DULERA 200-5 MCG/ACT AERO Inhale 2 puffs into the lungs 2 (two) times a day.    . Insulin Pen Needle 32G X 4 MM MISC Use to inject into the skin daily 100 each prn  . metFORMIN (GLUCOPHAGE XR) 500 MG 24 hr tablet Take 1 tablet (500 mg total) by mouth daily with breakfast. 30 tablet 3  . metoprolol succinate (TOPROL-XL) 25 MG 24 hr tablet TAKE ONE TAB BY MOUTH DAILY 90 tablet 0  . NUCALA 100 MG/ML SOAJ     . omeprazole (PRILOSEC) 40 MG capsule TAKE 1 CAPSULE BY MOUTH EVERY DAY 90 capsule 1  . Semaglutide,0.25 or 0.5MG /DOS, (OZEMPIC, 0.25 OR 0.5 MG/DOSE,) 2 MG/1.5ML SOPN Inject 0.25 mg into the skin once a week. Then increase to 0.5mg  weekly after one month 1.5 mL 2  . testosterone cypionate (DEPOTESTOSTERONE CYPIONATE) 200 MG/ML injection Inject 1 mL (200 mg total) into the muscle every 14 (fourteen) days. 6 mL 0  . VYVANSE 40 MG capsule Take 40 mg by mouth at bedtime.     No  facility-administered medications prior to visit.    Allergies  Allergen Reactions  . Penicillins Anaphylaxis and Swelling    REACTION: anaphalatic    . Lisinopril Other (See Comments)    ED  . Losartan Other (See Comments)    Dizziness and leg pain    Review of Systems     Objective:    Physical Exam Constitutional:      Appearance: He is well-developed and well-nourished.  HENT:     Head: Normocephalic and atraumatic.  Cardiovascular:     Rate and Rhythm: Normal rate and regular rhythm.     Heart sounds: Normal heart sounds.  Pulmonary:     Effort: Pulmonary effort is normal.     Breath sounds: Wheezing present.     Comments: Diffuse mild expiratory wheeze. Skin:    General: Skin is warm and dry.  Neurological:     Mental Status: He is alert and oriented to person, place, and time.  Psychiatric:        Mood and Affect: Mood and affect normal.        Behavior: Behavior normal.     BP 126/81   Pulse 64   Ht 5\' 5"  (1.651 m)   Wt 236 lb (107 kg)   SpO2 99%   BMI 39.27 kg/m  Wt Readings from Last 3 Encounters:  10/12/20 236 lb (107 kg)  08/31/20 237 lb (107.5 kg)  06/29/20 240 lb (108.9 kg)    Health Maintenance Due  Topic Date Due  . Hepatitis C Screening  Never done  . COVID-19 Vaccine (1) Never done  . URINE MICROALBUMIN  Never done  . COLONOSCOPY (Pts 45-45yrs Insurance coverage will need to be confirmed)  Never done    There are no preventive care reminders to display for this patient.   Lab Results  Component Value Date   TSH 2.542 09/02/2012   Lab Results  Component Value Date   WBC 8.9 06/27/2020   HGB 17.0 06/27/2020   HCT 49.5 06/27/2020   MCV 91.2 06/27/2020   PLT 284 06/27/2020   Lab Results  Component Value Date   NA 141 06/27/2020   K 4.4 06/27/2020   CO2 30 06/27/2020   GLUCOSE 137 (H) 06/27/2020   BUN 14 06/27/2020   CREATININE 1.07 06/27/2020   BILITOT 0.9 06/27/2020   ALKPHOS 58 01/05/2017   AST 20 06/27/2020  ALT 45 06/27/2020   PROT 6.8 06/27/2020   ALBUMIN 4.4 01/05/2017   CALCIUM 9.5 06/27/2020   Lab Results  Component Value Date   CHOL 196 06/27/2020   Lab Results  Component Value Date   HDL 43 06/27/2020   Lab Results  Component Value Date   LDLCALC 126 (H) 06/27/2020   Lab Results  Component Value Date   TRIG 159 (H) 06/27/2020   Lab Results  Component Value Date   CHOLHDL 4.6 06/27/2020   Lab Results  Component Value Date   HGBA1C 5.5 08/31/2020       Assessment & Plan:   Problem List Items Addressed This Visit      Cardiovascular and Mediastinum   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H[facility])    Refer him to pulmonology locally in [place] close to where he works.  To see if he could be restarted on the Nucala.  Interim I did send in a prescription for the Spiriva he has been using the capsules and disc.      Relevant Orders   Ambulatory referral to Pulmonology     Endocrine   Steroid-induced diabetes ([facility])    Far doing well on Ozempic.  Plan to follow-up in 3 months.  Continue to work on [employer], portion control and regular exercise.        Other   Attention deficit hyperactivity disorder (ADHD), combined type - Primary    Diagnosed at [facility].  Recently switched to Adderall 30 mg.  New prescription sent to pharmacy to be filled when due.  I will see him back in 3 months.      Relevant Medications   amphetamine-dextroamphetamine (ADDERALL XR) 30 MG 24 hr capsule       Meds ordered this encounter  Medications  . tiotropium (SPIRIVA HANDIHALER) 18 MCG inhalation capsule    Sig: Place 1 capsule (18 mcg total) into inhaler and inhale daily.    Dispense:  90 capsule    Refill:  4  . amphetamine-dextroamphetamine (ADDERALL XR) 30 MG 24 hr capsule    Sig: Take 1 capsule (30 mg total) by mouth every morning.    Dispense:  30 capsule    Refill:  0    Ok to fill when due     Nani Gasser, MD

## 2020-10-17 ENCOUNTER — Other Ambulatory Visit: Payer: Self-pay | Admitting: Family Medicine

## 2020-10-23 ENCOUNTER — Institutional Professional Consult (permissible substitution): Payer: BC Managed Care – PPO | Admitting: Pulmonary Disease

## 2020-11-06 ENCOUNTER — Ambulatory Visit (INDEPENDENT_AMBULATORY_CARE_PROVIDER_SITE_OTHER): Payer: BC Managed Care – PPO | Admitting: Family Medicine

## 2020-11-06 ENCOUNTER — Other Ambulatory Visit: Payer: Self-pay

## 2020-11-06 ENCOUNTER — Encounter: Payer: Self-pay | Admitting: Family Medicine

## 2020-11-06 ENCOUNTER — Other Ambulatory Visit: Payer: Self-pay | Admitting: Family Medicine

## 2020-11-06 VITALS — BP 130/78 | HR 73 | Ht 65.0 in | Wt 239.0 lb

## 2020-11-06 DIAGNOSIS — Z1211 Encounter for screening for malignant neoplasm of colon: Secondary | ICD-10-CM

## 2020-11-06 DIAGNOSIS — Z0000 Encounter for general adult medical examination without abnormal findings: Secondary | ICD-10-CM

## 2020-11-06 LAB — POCT UA - MICROALBUMIN
Albumin/Creatinine Ratio, Urine, POC: <30
Creatinine, POC: 200 mg/dL
Microalbumin Ur, POC: 10 mg/L

## 2020-11-06 NOTE — Progress Notes (Signed)
Established Patient Office Visit  Subjective:  Patient ID: Christian Gregory, male    DOB: 10/15/1970  Age: 50 y.o. MRN: [id number]  CC:  Chief Complaint  Patient presents with  . Annual Exam    HPI Christian Gregory presents for CPE.  He is doing well overall.  He states really active with his job.  Though he does not have a discrete exercise routine.  He said he did get the phone call about doing his colonoscopy just has not had a chance to call them back.  No recent chest pain or shortness of breath.  No change in bowels.  On his home scale he is down to 230 pounds.  Been doing well with the Ozempic so far for his diabetes he has been tolerating it well without any significant side effects or nausea.  He says in a couple days when he gets his next injection he will go up to the 0.5 mg dose.  He did have some questions about how to administer the shot.  Past Medical History:  Diagnosis Date  . Anxiety    anger problems  . Eosinophilic esophagitis   . Fatty liver   . History of methicillin resistant staphylococcus aureus (MRSA)   . Hypertension   . Obesity     Past Surgical History:  Procedure Laterality Date  . ESOPHAGEAL DILATION      Family History  Problem Relation Age of Onset  . Hypertension Mother        father  . Stroke Father     Social History   Socioeconomic History  . Marital status: Married    Spouse name: Not on file  . Number of children: Not on file  . Years of education: Not on file  . Highest education level: Not on file  Occupational History  . Not on file  Tobacco Use  . Smoking status: Never Smoker  . Smokeless tobacco: Never Used  Substance and Sexual Activity  . Alcohol use: Yes    Comment: 6 pack of beer 2-3 x a week  . Drug use: No  . Sexual activity: Yes  Other Topics Concern  . Not on file  Social History Narrative  . Not on file   Social Determinants of Health   Financial Resource Strain: Not on file  Food Insecurity: Not on  file  Transportation Needs: Not on file  Physical Activity: Not on file  Stress: Not on file  Social Connections: Not on file  Intimate Partner Violence: Not on file    Outpatient Medications Prior to Visit  Medication Sig Dispense Refill  . albuterol (VENTOLIN HFA) 108 (90 Base) MCG/ACT inhaler TAKE 2 PUFFS BY MOUTH EVERY 6 HOURS AS NEEDED FOR WHEEZE OR SHORTNESS OF BREATH 8.5 each 2  . amphetamine-dextroamphetamine (ADDERALL XR) 30 MG 24 hr capsule Take 1 capsule (30 mg total) by mouth every morning. 30 capsule 0  . DULERA 200-5 MCG/ACT AERO Inhale 2 puffs into the lungs 2 (two) times a day.    . Insulin Pen Needle 32G X 4 MM MISC Use to inject into the skin daily 100 each prn  . metoprolol succinate (TOPROL-XL) 25 MG 24 hr tablet TAKE ONE TAB BY MOUTH DAILY 90 tablet 0  . omeprazole (PRILOSEC) 40 MG capsule TAKE 1 CAPSULE BY MOUTH EVERY DAY 90 capsule 1  . Semaglutide,0.25 or 0.5MG /DOS, (OZEMPIC, 0.25 OR 0.5 MG/DOSE,) 2 MG/1.5ML SOPN Inject 0.25 mg into the skin once a week. Then increase  to 0.5mg  weekly after one month 1.5 mL 2  . testosterone cypionate (DEPOTESTOSTERONE CYPIONATE) 200 MG/ML injection Inject 1 mL (200 mg total) into the muscle every 14 (fourteen) days. 6 mL 0  . tiotropium (SPIRIVA HANDIHALER) 18 MCG inhalation capsule Place 1 capsule (18 mcg total) into inhaler and inhale daily. 90 capsule 4  . buPROPion (WELLBUTRIN XL) 150 MG 24 hr tablet Take 1 tablet (150 mg total) by mouth daily. 30 tablet 1  . amLODipine (NORVASC) 5 MG tablet TAKE 1 TABLET BY MOUTH EVERY DAY 90 tablet 1  . metFORMIN (GLUCOPHAGE XR) 500 MG 24 hr tablet Take 1 tablet (500 mg total) by mouth daily with breakfast. 30 tablet 3  . NUCALA 100 MG/ML SOAJ      No facility-administered medications prior to visit.    Allergies  Allergen Reactions  . Penicillins Anaphylaxis and Swelling    REACTION: anaphalatic    . Lisinopril Other (See Comments)    ED  . Losartan Other (See Comments)    Dizziness  and leg pain    ROS Review of Systems    Objective:    Physical Exam Constitutional:      Appearance: He is well-developed.  HENT:     Head: Normocephalic and atraumatic.     Right Ear: External ear normal.     Left Ear: External ear normal.     Nose: Nose normal.  Eyes:     Conjunctiva/sclera: Conjunctivae normal.     Pupils: Pupils are equal, round, and reactive to light.  Neck:     Thyroid: No thyromegaly.  Cardiovascular:     Rate and Rhythm: Normal rate and regular rhythm.     Heart sounds: Normal heart sounds.  Pulmonary:     Effort: Pulmonary effort is normal.     Breath sounds: Normal breath sounds.  Abdominal:     General: Bowel sounds are normal. There is no distension.     Palpations: Abdomen is soft. There is no mass.     Tenderness: There is no abdominal tenderness. There is no guarding or rebound.  Musculoskeletal:        General: Normal range of motion.     Cervical back: Normal range of motion and neck supple.  Lymphadenopathy:     Cervical: No cervical adenopathy.  Skin:    General: Skin is warm and dry.  Neurological:     Mental Status: He is alert and oriented to person, place, and time.     Deep Tendon Reflexes: Reflexes are normal and symmetric.  Psychiatric:        Behavior: Behavior normal.        Thought Content: Thought content normal.        Judgment: Judgment normal.     BP 130/78   Pulse 73   Ht 5\' 5"  (1.651 m)   Wt 239 lb (108.4 kg)   SpO2 99%   BMI 39.77 kg/m  Wt Readings from Last 3 Encounters:  11/06/20 239 lb (108.4 kg)  10/12/20 236 lb (107 kg)  08/31/20 237 lb (107.5 kg)     Health Maintenance Due  Topic Date Due  . Hepatitis C Screening  Never done  . COVID-19 Vaccine (1) Never done  . COLONOSCOPY (Pts 45-27yrs Insurance coverage will need to be confirmed)  Never done    There are no preventive care reminders to display for this patient.  Lab Results  Component Value Date   TSH 2.542 09/02/2012   Lab  Results  Component Value Date   WBC 8.9 06/27/2020   HGB 17.0 06/27/2020   HCT 49.5 06/27/2020   MCV 91.2 06/27/2020   PLT 284 06/27/2020   Lab Results  Component Value Date   NA 141 06/27/2020   K 4.4 06/27/2020   CO2 30 06/27/2020   GLUCOSE 137 (H) 06/27/2020   BUN 14 06/27/2020   CREATININE 1.07 06/27/2020   BILITOT 0.9 06/27/2020   ALKPHOS 58 01/05/2017   AST 20 06/27/2020   ALT 45 06/27/2020   PROT 6.8 06/27/2020   ALBUMIN 4.4 01/05/2017   CALCIUM 9.5 06/27/2020   Lab Results  Component Value Date   CHOL 196 06/27/2020   Lab Results  Component Value Date   HDL 43 06/27/2020   Lab Results  Component Value Date   LDLCALC 126 (H) 06/27/2020   Lab Results  Component Value Date   TRIG 159 (H) 06/27/2020   Lab Results  Component Value Date   CHOLHDL 4.6 06/27/2020   Lab Results  Component Value Date   HGBA1C 5.5 08/31/2020      Assessment & Plan:   Problem List Items Addressed This Visit   None   Visit Diagnoses    Wellness examination    -  Primary   Relevant Orders   CBC   COMPLETE METABOLIC PANEL WITH GFR   PSA   POCT UA - Microalbumin (Completed)   Screening for malignant neoplasm of colon       Relevant Orders   Ambulatory referral to Gastroenterology     Keep up a regular exercise program and make sure you are eating a healthy diet Try to eat 4 servings of dairy a day, or if you are lactose intolerant take a calcium with vitamin D daily.  Your vaccines are up to date.  Encouraged him to schedule his colonoscopy.    No orders of the defined types were placed in this encounter.   Health Maintenance  Topic Date Due  . Hepatitis C Screening  Never done  . COVID-19 Vaccine (1) Never done  . COLONOSCOPY (Pts 45-23yrs Insurance coverage will need to be confirmed)  Never done  . INFLUENZA VACCINE  11/15/2020 (Originally 03/18/2020)  . URINE MICROALBUMIN  11/06/2021  . TETANUS/TDAP  07/20/2028  . HIV Screening  Completed  . HPV VACCINES   Aged Out    Follow-up: No follow-ups on file.    Nani Gasser, MD

## 2020-11-06 NOTE — Patient Instructions (Signed)

## 2020-11-07 LAB — CBC
HCT: 51.6 % — ABNORMAL HIGH (ref 38.5–50.0)
Hemoglobin: 17.7 g/dL — ABNORMAL HIGH (ref 13.2–17.1)
MCH: 31.5 pg (ref 27.0–33.0)
MCHC: 34.3 g/dL (ref 32.0–36.0)
MCV: 91.8 fL (ref 80.0–100.0)
MPV: 10.2 fL (ref 7.5–12.5)
Platelets: 394 10*3/uL (ref 140–400)
RBC: 5.62 10*6/uL (ref 4.20–5.80)
RDW: 12.1 % (ref 11.0–15.0)
WBC: 9.6 10*3/uL (ref 3.8–10.8)

## 2020-11-07 LAB — COMPLETE METABOLIC PANEL WITH GFR
AG Ratio: 1.5 (calc) (ref 1.0–2.5)
ALT: 57 U/L — ABNORMAL HIGH (ref 9–46)
AST: 30 U/L (ref 10–35)
Albumin: 4.4 g/dL (ref 3.6–5.1)
Alkaline phosphatase (APISO): 54 U/L (ref 35–144)
BUN: 20 mg/dL (ref 7–25)
CO2: 29 mmol/L (ref 20–32)
Calcium: 9.6 mg/dL (ref 8.6–10.3)
Chloride: 100 mmol/L (ref 98–110)
Creat: 1.06 mg/dL (ref 0.70–1.33)
GFR, Est African American: 94 mL/min/{1.73_m2} (ref >=60)
GFR, Est Non African American: 81 mL/min/{1.73_m2} (ref >=60)
Globulin: 2.9 g/dL (calc) (ref 1.9–3.7)
Glucose, Bld: 111 mg/dL — ABNORMAL HIGH (ref 65–99)
Potassium: 4.6 mmol/L (ref 3.5–5.3)
Sodium: 138 mmol/L (ref 135–146)
Total Bilirubin: 0.6 mg/dL (ref 0.2–1.2)
Total Protein: 7.3 g/dL (ref 6.1–8.1)

## 2020-11-07 LAB — PSA: PSA: 0.69 ng/mL (ref <=4.0)

## 2020-11-19 ENCOUNTER — Ambulatory Visit (INDEPENDENT_AMBULATORY_CARE_PROVIDER_SITE_OTHER): Payer: BC Managed Care – PPO | Admitting: Pulmonary Disease

## 2020-11-19 ENCOUNTER — Other Ambulatory Visit: Payer: Self-pay

## 2020-11-19 ENCOUNTER — Encounter: Payer: Self-pay | Admitting: Pulmonary Disease

## 2020-11-19 VITALS — BP 138/86 | HR 80 | Temp 97.9°F | Ht 65.0 in | Wt 244.6 lb

## 2020-11-19 DIAGNOSIS — J4540 Moderate persistent asthma, uncomplicated: Secondary | ICD-10-CM

## 2020-11-19 DIAGNOSIS — D7218 Eosinophilia in diseases classified elsewhere: Secondary | ICD-10-CM

## 2020-11-19 DIAGNOSIS — M301 Polyarteritis with lung involvement [Churg-Strauss]: Secondary | ICD-10-CM

## 2020-11-19 MED ORDER — TRELEGY ELLIPTA 200-62.5-25 MCG/INH IN AEPB: 1.0000 | INHALATION_SPRAY | Freq: Every day | RESPIRATORY_TRACT | 11 refills | Status: DC

## 2020-11-19 NOTE — Patient Instructions (Addendum)
Nice to meet you  Use Trelegy 1 puff daily - this replaces the Spiriva and Dulera (has same medications in one inhaler). If breathing worsens we can go back to the [place].   We will do paperwork to resume the Nucala.  This may take some time to go through but we will work on getting the medicine to use it is possible.  Return to clinic for follow-up in 3 months with Dr. Judeth Horn

## 2020-11-20 ENCOUNTER — Other Ambulatory Visit: Payer: Self-pay | Admitting: Family Medicine

## 2020-11-20 ENCOUNTER — Encounter: Payer: Self-pay | Admitting: Family Medicine

## 2020-11-20 DIAGNOSIS — F902 Attention-deficit hyperactivity disorder, combined type: Secondary | ICD-10-CM

## 2020-11-20 MED ORDER — AMPHETAMINE-DEXTROAMPHET ER 30 MG PO CP24: 30.0000 mg | ORAL_CAPSULE | ORAL | 0 refills | Status: DC

## 2020-11-22 ENCOUNTER — Other Ambulatory Visit (HOSPITAL_COMMUNITY): Payer: Self-pay

## 2020-11-22 ENCOUNTER — Telehealth: Payer: Self-pay | Admitting: Pharmacy Technician

## 2020-11-22 NOTE — Telephone Encounter (Signed)
Received New start paperwork for NUCALA. Will update as we work through the benefits process. 

## 2020-11-22 NOTE — Telephone Encounter (Signed)
Submitted a Prior Authorization request to [employer] for NUCALA via Fax. Will update once we receive a response.   Phone# [phone]

## 2020-11-23 NOTE — Telephone Encounter (Signed)
Faxed PA request's clinical questions to [facility] Nucala for continuation of therapy although patient has been out of medication for 3 months.  Fax: [phone]

## 2020-11-29 NOTE — Telephone Encounter (Signed)
Patient should already have Nucala copy card, since he is continuation of therapy. No pharmacy restrictions.

## 2020-12-04 ENCOUNTER — Other Ambulatory Visit (HOSPITAL_COMMUNITY): Payer: Self-pay

## 2020-12-04 NOTE — Telephone Encounter (Signed)
Left VM for patient. If he's been off of Nucala for >3 months, then he will have to come to clinic with Epipen to re-start.  Nucala copay card info: ID: [id number] BIN: [id number] PCN: [id number] Group: [id number]  Chesley Mires, PharmD, MPH Clinical Pharmacist (Rheumatology and Pulmonology)

## 2020-12-07 NOTE — Telephone Encounter (Signed)
Left VM with patient regarding Nucala re-start - requested return call and left my direct office number.  Chesley Mires, PharmD, MPH Clinical Pharmacist (Rheumatology and Pulmonology)

## 2020-12-11 ENCOUNTER — Other Ambulatory Visit (HOSPITAL_COMMUNITY): Payer: Self-pay

## 2020-12-11 MED ORDER — EPINEPHRINE 0.3 MG/0.3ML IJ SOAJ: 0.3000 mg | INTRAMUSCULAR | 2 refills | Status: DC | PRN

## 2020-12-11 NOTE — Telephone Encounter (Signed)
Patient returned call and scheduled visit with pharmacy team to re-start Nucala on 12/17/20 @3 :20pm. Epipen rx sent to pharmacy. Patient aware of monitoring time and Epipen requirement.  , PharmD, MPH Clinical Pharmacist (Rheumatology and Pulmonology)

## 2020-12-11 NOTE — Telephone Encounter (Signed)
Patient reached out stating Epipen is not covered. Per test claim, his Epipen is >$400 b/c he has met not yet met deductible.  Texted him Goodrx copay card information. He will f/u  Goodrx copay at $115.99 BIN: [id number] PCN: [id number] Group: [id number] Member ID:  [id number]  Knox Saliva, PharmD, MPH Clinical Pharmacist (Rheumatology and Pulmonology)

## 2020-12-17 ENCOUNTER — Other Ambulatory Visit: Payer: Self-pay

## 2020-12-17 ENCOUNTER — Ambulatory Visit: Payer: BC Managed Care – PPO | Admitting: Pharmacist

## 2020-12-17 ENCOUNTER — Other Ambulatory Visit (HOSPITAL_COMMUNITY): Payer: Self-pay

## 2020-12-17 DIAGNOSIS — Z7189 Other specified counseling: Secondary | ICD-10-CM

## 2020-12-17 DIAGNOSIS — Z79899 Other long term (current) drug therapy: Secondary | ICD-10-CM

## 2020-12-17 MED ORDER — NUCALA 100 MG/ML ~~LOC~~ SOAJ: 100.0000 mg | SUBCUTANEOUS | 5 refills | Status: DC

## 2020-12-17 MED ORDER — ALBUTEROL SULFATE HFA 108 (90 BASE) MCG/ACT IN AERS: INHALATION_SPRAY | RESPIRATORY_TRACT | 2 refills | Status: DC

## 2020-12-17 NOTE — Patient Instructions (Addendum)
Prescription for Nucala sent to [facility]. Your next dose is due 5/30, and every 4 weeks thereafter Their phone number is: [phone]   Your Nucala should be affordable. Please ensure the pharmacy is also using your copay card information as below: Nucala copay card: ID: [id number] BIN: [id number] PCN: [id number] Group: [id number]  Please try re-filling your Epipen after you get your Nucala filled to see if it will be covered at reduced cost.  Remember the 5 C's:  COUNTER - leave on the counter at least 30 minutes but up to overnight to bring medication to room temperature. This may help prevent stinging  COLD - place something cold (like an ice gel pack or cold water bottle) on the injection site just before cleansing with alcohol. This may help reduce pain  CLARITIN - use Claritin (generic name is loratadine) for the first two weeks of treatment or the day of, the day before, and the day after injecting. This will help to minimize injection site reactions  CORTISONE CREAM - apply if injection site is irritated and itching  CALL ME - if injection site reaction is bigger than the size of your fist, looks infected, blisters, or if you develop hives

## 2020-12-17 NOTE — Progress Notes (Signed)
HPI Patient presents today to [facility] to see pharmacy team to restart Nucala.  Past medical history includes asthma, uncontrolled HTN, eosinophilic vasculitis, HLD, ADHD.  He states that he stopped Nucala d/t lapse in provider continuity. He has been off of Nucala for at least 3 months.  He has Epipen on hand today - states he paid someone he knows for this Epipen. We discussed that it is office policy that he have his own. Epipen is in date. He has had anaphylactic reaction to penicillin.  Respiratory Medications Current: recently started on Trelegy Ellipta (high dose) once daily. Previous: switched from [facility] + Symbicort on 11/19/20 Patient reports no known adherence challenges  OBJECTIVE Allergies  Allergen Reactions  . Penicillins Anaphylaxis and Swelling    REACTION: anaphalatic    . Lisinopril Other (See Comments)    ED  . Losartan Other (See Comments)    Dizziness and leg pain    Outpatient Encounter Medications as of 12/17/2020  Medication Sig  . albuterol (VENTOLIN HFA) 108 (90 Base) MCG/ACT inhaler TAKE 2 PUFFS BY MOUTH EVERY 6 HOURS AS NEEDED FOR WHEEZE OR SHORTNESS OF BREATH  . amphetamine-dextroamphetamine (ADDERALL XR) 30 MG 24 hr capsule Take 1 capsule (30 mg total) by mouth every morning.  Marland Kitchen buPROPion (WELLBUTRIN XL) 150 MG 24 hr tablet TAKE 1 TABLET BY MOUTH EVERY DAY  . EPINEPHrine 0.3 mg/0.3 mL IJ SOAJ injection Inject 0.3 mg into the muscle as needed for anaphylaxis.  Marland Kitchen Fluticasone-Umeclidin-Vilant (TRELEGY ELLIPTA) 200-62.5-25 MCG/INH AEPB Inhale 1 puff into the lungs daily.  . Insulin Pen Needle 32G X 4 MM MISC Use to inject into the skin daily  . metoprolol succinate (TOPROL-XL) 25 MG 24 hr tablet TAKE 1 TABLET BY MOUTH EVERY DAY  . omeprazole (PRILOSEC) 40 MG capsule TAKE 1 CAPSULE BY MOUTH EVERY DAY  . Semaglutide,0.25 or 0.5MG /DOS, (OZEMPIC, 0.25 OR 0.5 MG/DOSE,) 2 MG/1.5ML SOPN Inject 0.25 mg into the skin once a week. Then increase to 0.5mg   weekly after one month  . testosterone cypionate (DEPOTESTOSTERONE CYPIONATE) 200 MG/ML injection Inject 1 mL (200 mg total) into the muscle every 14 (fourteen) days.   No facility-administered encounter medications on file as of 12/17/2020.     Immunization History  Administered Date(s) Administered  . Influenza,inj,Quad PF,6+ Mos 07/07/2017  . Influenza-Unspecified 06/01/2018  . Tdap 07/20/2018     Eosinophils Most recent blood eosinophil count was 567 cells/microL taken on 11/21/2015.   IgE: 35 on 10/04/18  Assessment   1. Biologics training . Mepolizumab (Nucala) o MOA: Not fully understood. It does act an interleukin-5 (IL-5) antagonist monoclonal antibody that reduces the production and survival of eosinophils by blocking the binding of IL-5 to the alpha chain of the receptor complex on the eosinophil cell surface. o Response to therapy: ??? o Side effects: headache (19%), injection site reaxtion (7-15%), antibody development (6%), backache (5%), fatigue (5%) o Dosing: 100-300 mg subQ every 4 weeks o Administration:  1.  For the 300 mg dose, administer as 3 separate 100 mg injections into the upper arm, thigh, or abdomen ?5 cm (~2 inches) apart if >1 injection administered at same site.  2. Do not shake the reconstituted solution as this could lead to product foaming or precipitation. 3. The solution should be clear to opalescent and colorless to pale yellow or pale brown, essentially particle free. Small air bubbles, however, are expected and acceptable. If particulate matter remains in the solution or if the solution appears cloudy or  milky, discard the solution.   He self-administered in the left upper thigh using Nucala 100mg /mL autoinjector sample. NDC: Lot: [id number] Exp: 12/2021  2. Medication Reconciliation  A drug regimen assessment was performed, including review of allergies, interactions, disease-state management, dosing and immunization history.  Medications were reviewed with the patient, including name, instructions, indication, goals of therapy, potential side effects, importance of adherence, and safe use.  Drug interaction(s): none identified  3. Immunizations  Patient has not received influenza and COVID19 vaccines and declined today. He does not have any questions about vaccinations.  PLAN - Continue Nucala 100mg  every 28 days. Next dose due 5/30 then every 4 weeks thereafter.  - He will continue Trelegy high dose 1 puff once daily as prescribed. - Patient advised to have [facility]-process his Epipen prescription after Nucala prescription has processed as Nucala copay card will pay towards his deductible.  All questions encouraged and answered.  Instructed patient to reach out with any further questions or concerns.  Thank you for allowing pharmacy to participate in this patient's care.  , PharmD, MPH Clinical Pharmacist (Rheumatology and Pulmonology)

## 2020-12-21 ENCOUNTER — Other Ambulatory Visit: Payer: Self-pay | Admitting: *Deleted

## 2020-12-21 DIAGNOSIS — F902 Attention-deficit hyperactivity disorder, combined type: Secondary | ICD-10-CM

## 2020-12-21 MED ORDER — AMPHETAMINE-DEXTROAMPHET ER 30 MG PO CP24: 30.0000 mg | ORAL_CAPSULE | ORAL | 0 refills | Status: DC

## 2020-12-23 ENCOUNTER — Other Ambulatory Visit: Payer: Self-pay | Admitting: Family Medicine

## 2020-12-27 ENCOUNTER — Encounter: Payer: Self-pay | Admitting: Family Medicine

## 2020-12-27 DIAGNOSIS — E291 Testicular hypofunction: Secondary | ICD-10-CM

## 2020-12-27 MED ORDER — TESTOSTERONE CYPIONATE 200 MG/ML IM SOLN: 200.0000 mg | INTRAMUSCULAR | 0 refills | Status: DC

## 2020-12-27 NOTE — Telephone Encounter (Signed)
Medication sent but he does need to go for testosterone level before his next injection if possible or at least before the one after that.  So that we can make sure that his level is not too high like it was back in the fall.

## 2020-12-27 NOTE — Telephone Encounter (Signed)
Ok ot update med list.  Thanks

## 2021-01-10 ENCOUNTER — Ambulatory Visit (INDEPENDENT_AMBULATORY_CARE_PROVIDER_SITE_OTHER): Payer: BC Managed Care – PPO | Admitting: Family Medicine

## 2021-01-10 ENCOUNTER — Encounter: Payer: Self-pay | Admitting: Family Medicine

## 2021-01-10 ENCOUNTER — Other Ambulatory Visit: Payer: Self-pay

## 2021-01-10 VITALS — BP 135/77 | HR 77 | Ht 65.0 in | Wt 241.0 lb

## 2021-01-10 DIAGNOSIS — R0981 Nasal congestion: Secondary | ICD-10-CM

## 2021-01-10 DIAGNOSIS — F902 Attention-deficit hyperactivity disorder, combined type: Secondary | ICD-10-CM

## 2021-01-10 DIAGNOSIS — I7789 Other specified disorders of arteries and arterioles: Secondary | ICD-10-CM

## 2021-01-10 DIAGNOSIS — E099 Drug or chemical induced diabetes mellitus without complications: Secondary | ICD-10-CM | POA: Diagnosis not present

## 2021-01-10 DIAGNOSIS — T380X5D Adverse effect of glucocorticoids and synthetic analogues, subsequent encounter: Secondary | ICD-10-CM

## 2021-01-10 DIAGNOSIS — E291 Testicular hypofunction: Secondary | ICD-10-CM | POA: Diagnosis not present

## 2021-01-10 DIAGNOSIS — D7218 Eosinophilia in diseases classified elsewhere: Secondary | ICD-10-CM

## 2021-01-10 DIAGNOSIS — I10 Essential (primary) hypertension: Secondary | ICD-10-CM

## 2021-01-10 MED ORDER — AZELASTINE HCL 0.1 % NA SOLN: 2.0000 | Freq: Two times a day (BID) | NASAL | 2 refills | Status: DC

## 2021-01-10 MED ORDER — AMPHETAMINE-DEXTROAMPHET ER 30 MG PO CP24: 30.0000 mg | ORAL_CAPSULE | ORAL | 0 refills | Status: DC

## 2021-01-10 NOTE — Assessment & Plan Note (Signed)
On Nucala

## 2021-01-10 NOTE — Assessment & Plan Note (Signed)
Well controlled. Continue current regimen. Follow up in  6 mo. RF sent for 90 days. BP ok will monitor since borderline today.

## 2021-01-10 NOTE — Progress Notes (Signed)
Established Patient Office Visit  Subjective:  Patient ID: Christian Gregory, male    DOB: 03/18/1971  Age: 50 y.o. MRN: [id number]  CC:  Chief Complaint  Patient presents with  . ADD    HPI [name] presents for   ADD - Reports symptoms are well controlled on current regime. Denies any problems with insomnia, chest pain, palpitations, or SOB.     F/U eosinophilic vasculitis - he is back on Nucala and has been working the [employer] . previously followed at [facility].  Still getting a lot of nasal drainage and congestion from the drainage.  Inaherl changed to Trelegy. Doing well on that.      Past Medical History:  Diagnosis Date  . Anxiety    anger problems  . Eosinophilic esophagitis   . Fatty liver   . History of methicillin resistant staphylococcus aureus (MRSA)   . Hypertension   . Obesity     Past Surgical History:  Procedure Laterality Date  . ESOPHAGEAL DILATION      Family History  Problem Relation Age of Onset  . Hypertension Mother        father  . Stroke Father     Social History   Socioeconomic History  . Marital status: Married    Spouse name: Not on file  . Number of children: Not on file  . Years of education: Not on file  . Highest education level: Not on file  Occupational History  . Not on file  Tobacco Use  . Smoking status: Never Smoker  . Smokeless tobacco: Never Used  Substance and Sexual Activity  . Alcohol use: Yes    Comment: 6 pack of beer 2-3 x a week  . Drug use: No  . Sexual activity: Yes  Other Topics Concern  . Not on file  Social History Narrative  . Not on file   Social Determinants of Health   Financial Resource Strain: Not on file  Food Insecurity: Not on file  Transportation Needs: Not on file  Physical Activity: Not on file  Stress: Not on file  Social Connections: Not on file  Intimate Partner Violence: Not on file    Outpatient Medications Prior to Visit  Medication Sig Dispense Refill  .  albuterol (VENTOLIN HFA) 108 (90 Base) MCG/ACT inhaler TAKE 2 PUFFS BY MOUTH EVERY 6 HOURS AS NEEDED FOR WHEEZE OR SHORTNESS OF BREATH 8.5 each 2  . buPROPion (WELLBUTRIN XL) 150 MG 24 hr tablet TAKE 1 TABLET BY MOUTH EVERY DAY 90 tablet 1  . EPINEPHrine 0.3 mg/0.3 mL IJ SOAJ injection Inject 0.3 mg into the muscle as needed for anaphylaxis. 1 each 2  . Fluticasone-Umeclidin-Vilant (TRELEGY ELLIPTA) 200-62.5-25 MCG/INH AEPB Inhale 1 puff into the lungs daily. 60 each 11  . Insulin Pen Needle 32G X 4 MM MISC Use to inject into the skin daily 100 each prn  . Mepolizumab (NUCALA) 100 MG/ML SOAJ Inject 1 mL (100 mg total) into the skin every 28 (twenty-eight) days. Deliver to patient's home for self-administration 1 mL 5  . metoprolol succinate (TOPROL-XL) 25 MG 24 hr tablet TAKE 1 TABLET BY MOUTH EVERY DAY 90 tablet 0  . Semaglutide,0.25 or 0.5MG /DOS, (OZEMPIC, 0.25 OR 0.5 MG/DOSE,) 2 MG/1.5ML SOPN Inject 0.25 mg into the skin once a week. Then increase to 0.5mg  weekly after one month 1.5 mL 2  . testosterone cypionate (DEPOTESTOSTERONE CYPIONATE) 200 MG/ML injection Inject 1 mL (200 mg total) into the muscle every 14 (  fourteen) days. 6 mL 0  . amphetamine-dextroamphetamine (ADDERALL XR) 30 MG 24 hr capsule Take 1 capsule (30 mg total) by mouth every morning. 30 capsule 0   No facility-administered medications prior to visit.    Allergies  Allergen Reactions  . Penicillins Anaphylaxis and Swelling    REACTION: anaphalatic    . Lisinopril Other (See Comments)    ED  . Losartan Other (See Comments)    Dizziness and leg pain    ROS Review of Systems    Objective:    Physical Exam Constitutional:      Appearance: He is well-developed.  HENT:     Head: Normocephalic and atraumatic.     Comments: Nasal deformity and swelling laterally Cardiovascular:     Rate and Rhythm: Normal rate and regular rhythm.     Heart sounds: Normal heart sounds.  Pulmonary:     Effort: Pulmonary effort is  normal.     Breath sounds: Normal breath sounds.  Skin:    General: Skin is warm and dry.  Neurological:     Mental Status: He is alert and oriented to person, place, and time.  Psychiatric:        Behavior: Behavior normal.     BP 135/77   Pulse 77   Ht 5\' 5"  (1.651 m)   Wt 241 lb (109.3 kg)   SpO2 97%   BMI 40.10 kg/m  Wt Readings from Last 3 Encounters:  01/10/21 241 lb (109.3 kg)  11/19/20 244 lb 9.6 oz (110.9 kg)  11/06/20 239 lb (108.4 kg)     Health Maintenance Due  Topic Date Due  . COVID-19 Vaccine (1) Never done  . Hepatitis C Screening  Never done  . COLONOSCOPY (Pts 45-25yrs Insurance coverage will need to be confirmed)  Never done  . Zoster Vaccines- Shingrix (1 of 2) Never done    There are no preventive care reminders to display for this patient.  Lab Results  Component Value Date   TSH 2.542 09/02/2012   Lab Results  Component Value Date   WBC 9.6 11/06/2020   HGB 17.7 (H) 11/06/2020   HCT 51.6 (H) 11/06/2020   MCV 91.8 11/06/2020   PLT 394 11/06/2020   Lab Results  Component Value Date   NA 138 11/06/2020   K 4.6 11/06/2020   CO2 29 11/06/2020   GLUCOSE 111 (H) 11/06/2020   BUN 20 11/06/2020   CREATININE 1.06 11/06/2020   BILITOT 0.6 11/06/2020   ALKPHOS 58 01/05/2017   AST 30 11/06/2020   ALT 57 (H) 11/06/2020   PROT 7.3 11/06/2020   ALBUMIN 4.4 01/05/2017   CALCIUM 9.6 11/06/2020   Lab Results  Component Value Date   CHOL 196 06/27/2020   Lab Results  Component Value Date   HDL 43 06/27/2020   Lab Results  Component Value Date   LDLCALC 126 (H) 06/27/2020   Lab Results  Component Value Date   TRIG 159 (H) 06/27/2020   Lab Results  Component Value Date   CHOLHDL 4.6 06/27/2020   Lab Results  Component Value Date   HGBA1C 5.5 08/31/2020      Assessment & Plan:   Problem List Items Addressed This Visit      Cardiovascular and Mediastinum   ESSENTIAL HYPERTENSION, BENIGN    Well controlled. Continue current  regimen. Follow up in  6 mo. Discussed gaol of SBP < 130.      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HCC)    On Nucala  Endocrine   Steroid-induced diabetes (HCC)    Continue Ozempic. He is down another 4 lbs.  Plans on starting a weight loss program with a custom diet plan.        Hypogonadism male    Due to recheck testosterone levels before additional injectsion.        Relevant Orders   Testosterone Total,Free,Bio, Males     Other   Attention deficit hyperactivity disorder (ADHD), combined type - Primary    Well controlled. Continue current regimen. Follow up in  6 mo. RF sent for 90 days. BP ok will monitor since borderline today.       Relevant Medications   amphetamine-dextroamphetamine (ADDERALL XR) 30 MG 24 hr capsule (Start on 01/20/2021)    Other Visit Diagnoses    Nasal congestion          Nasal congestion - recommend trial of Astelin to see if helps. Discussed referrals back to Allergist. He is interested in oral tx for allergies vs allergy shots.  Will check on that.  .   Meds ordered this encounter  Medications  . azelastine (ASTELIN) 0.1 % nasal spray    Sig: Place 2 sprays into both nostrils 2 (two) times daily. Use in each nostril as directed    Dispense:  30 mL    Refill:  2  . amphetamine-dextroamphetamine (ADDERALL XR) 30 MG 24 hr capsule    Sig: Take 1 capsule (30 mg total) by mouth every morning.    Dispense:  30 capsule    Refill:  0  . amphetamine-dextroamphetamine (ADDERALL XR) 30 MG 24 hr capsule    Sig: Take 1 capsule (30 mg total) by mouth every morning.    Dispense:  30 capsule    Refill:  0  . amphetamine-dextroamphetamine (ADDERALL XR) 30 MG 24 hr capsule    Sig: Take 1 capsule (30 mg total) by mouth every morning.    Dispense:  30 capsule    Refill:  0    Follow-up: Return in about 4 months (around 05/13/2021) for ADD.    Nani Gasser, MD

## 2021-01-10 NOTE — Assessment & Plan Note (Signed)
Due to recheck testosterone levels before additional injectsion.

## 2021-01-10 NOTE — Assessment & Plan Note (Addendum)
Well controlled. Continue current regimen. Follow up in  6 mo. Discussed gaol of SBP < 130.

## 2021-01-10 NOTE — Assessment & Plan Note (Signed)
Continue Ozempic. He is down another 4 lbs.  Plans on starting a weight loss program with a custom diet plan.

## 2021-01-18 ENCOUNTER — Other Ambulatory Visit: Payer: Self-pay | Admitting: Family Medicine

## 2021-01-18 DIAGNOSIS — E099 Drug or chemical induced diabetes mellitus without complications: Secondary | ICD-10-CM

## 2021-01-18 DIAGNOSIS — T380X5D Adverse effect of glucocorticoids and synthetic analogues, subsequent encounter: Secondary | ICD-10-CM

## 2021-01-23 ENCOUNTER — Ambulatory Visit (INDEPENDENT_AMBULATORY_CARE_PROVIDER_SITE_OTHER): Payer: BC Managed Care – PPO | Admitting: Physician Assistant

## 2021-01-23 ENCOUNTER — Other Ambulatory Visit: Payer: Self-pay

## 2021-01-23 VITALS — BP 134/79 | HR 70 | Temp 98.4°F | Ht 65.0 in | Wt 239.0 lb

## 2021-01-23 DIAGNOSIS — L03115 Cellulitis of right lower limb: Secondary | ICD-10-CM

## 2021-01-23 DIAGNOSIS — Z22322 Carrier or suspected carrier of Methicillin resistant Staphylococcus aureus: Secondary | ICD-10-CM

## 2021-01-23 DIAGNOSIS — L02415 Cutaneous abscess of right lower limb: Secondary | ICD-10-CM | POA: Diagnosis not present

## 2021-01-23 MED ORDER — DOXYCYCLINE HYCLATE 100 MG PO TABS: 100.0000 mg | ORAL_TABLET | Freq: Two times a day (BID) | ORAL | 0 refills | Status: DC

## 2021-01-23 NOTE — Progress Notes (Signed)
   Subjective:    Patient ID: Christian Gregory, male    DOB: 1971/03/21, 50 y.o.   MRN: [id number]  HPI Pt is a 50 yo male with hx of MRSA and recurrent skin infections and abscess who presents to the clinic with abscess concern.   He had a pustule just below his right knee with surrounding warmth and redness. It is tender to touch. No fever, chills, n/v/d. He has expressed lots of drainage out of the knee.   .. Active Ambulatory Problems    Diagnosis Date Noted   OBESITY 11/01/2007   ESSENTIAL HYPERTENSION, BENIGN 11/01/2007   ESOPHAGEAL STRICTURE 08/30/2007   FATTY LIVER DISEASE 11/01/2007   MALLET FINGER, ACQUIRED 05/28/2010   Hypogonadism male 09/20/2012   Steroid-induced diabetes (HCC) 02/08/2013   Left lumbar radiculopathy 08/25/2013   Mood disorder (HCC) 09/21/2013   Separated shoulder 07/24/2014   Carpal tunnel syndrome, bilateral 08/09/2014   Hyperlipidemia 10/02/2015   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HCC) 01/26/2017   Nephrolithiasis 03/31/2018   Post-traumatic osteoarthritis of right ankle 01/14/2019   Abnormal weight gain 02/06/2020   Morbid obesity with body mass index of 40.0-44.9 in adult [facility]) 02/06/2020   Uncontrolled hypertension 05/21/2020   Irritability 08/31/2020   Attention deficit hyperactivity disorder (ADHD), combined type 10/12/2020   Resolved Ambulatory Problems    Diagnosis Date Noted   Cellulitis and abscess of trunk 03/10/2010   ANKLE SPRAIN, RIGHT 02/21/2010   Contusion of lower leg 11/01/2009   CELLULITIS AND ABSCESS OF LEG EXCEPT FOOT 08/23/2010   Acute bronchitis 03/16/2015   Abscess 05/21/2020   Past Medical History:  Diagnosis Date   Anxiety    Eosinophilic esophagitis    Fatty liver    History of methicillin resistant staphylococcus aureus (MRSA)    Hypertension    Obesity        Review of Systems See HPI.     Objective:   Physical Exam  Pustule with no active draining and surrounding warmth, tenderness and erythema 13cm by 11cm.  Area was marked.       Assessment & Plan:  .Marland KitchenAidric was seen today for skin problem.  Diagnoses and all orders for this visit:  Cellulitis and abscess of right leg -     doxycycline (VIBRA-TABS) 100 MG tablet; Take 1 tablet (100 mg total) by mouth 2 (two) times daily.  MRSA carrier -     doxycycline (VIBRA-TABS) 100 MG tablet; Take 1 tablet (100 mg total) by mouth 2 (two) times daily.  Continue to allow to drain.  Start doxycycline.  Warm compresses and keep leg elevated.  Follow up if not improving or moving beyond the marked lines.

## 2021-01-23 NOTE — Patient Instructions (Signed)
Cellulitis, Adult  Cellulitis is a skin infection. The infected area is often warm, red, swollen, and sore. It occurs most often in the arms and lower legs. It is very important to get treated for this condition. What are the causes? This condition is caused by bacteria. The bacteria enter through a break in the skin, such as a cut, burn, insect bite, open sore, or crack. What increases the risk? This condition is more likely to occur in people who:  Have a weak body defense system (immune system).  Have open cuts, burns, bites, or scrapes on the skin.  Are older than 51 years of age.  Have a blood sugar problem (diabetes).  Have a long-lasting (chronic) liver disease (cirrhosis) or kidney disease.  Are very overweight (obese).  Have a skin problem, such as: ? Itchy rash (eczema). ? Slow movement of blood in the veins (venous stasis). ? Fluid buildup below the skin (edema).  Have been treated with high-energy rays (radiation).  Use IV drugs. What are the signs or symptoms? Symptoms of this condition include:  Skin that is: ? Red. ? Streaking. ? Spotting. ? Swollen. ? Sore or painful when you touch it. ? Warm.  A fever.  Chills.  Blisters. How is this diagnosed? This condition is diagnosed based on:  Medical history.  Physical exam.  Blood tests.  Imaging tests. How is this treated? Treatment for this condition may include:  Medicines to treat infections or allergies.  Home care, such as: ? Rest. ? Placing cold or warm cloths (compresses) on the skin.  Hospital care, if the condition is very bad. Follow these instructions at home: Medicines  Take over-the-counter and prescription medicines only as told by your doctor.  If you were prescribed an antibiotic medicine, take it as told by your doctor. Do not stop taking it even if you start to feel better. General instructions  Drink enough fluid to keep your pee (urine) pale yellow.  Do not  touch or rub the infected area.  Raise (elevate) the infected area above the level of your heart while you are sitting or lying down.  Place cold or warm cloths on the area as told by your doctor.  Keep all follow-up visits as told by your doctor. This is important.   Contact a doctor if:  You have a fever.  You do not start to get better after 1-2 days of treatment.  Your bone or joint under the infected area starts to hurt after the skin has healed.  Your infection comes back. This can happen in the same area or another area.  You have a swollen bump in the area.  You have new symptoms.  You feel ill and have muscle aches and pains. Get help right away if:  Your symptoms get worse.  You feel very sleepy.  You throw up (vomit) or have watery poop (diarrhea) for a long time.  You see red streaks coming from the area.  Your red area gets larger.  Your red area turns dark in color. These symptoms may represent a serious problem that is an emergency. Do not wait to see if the symptoms will go away. Get medical help right away. Call your local emergency services (911 in the [place] Do not drive yourself to the hospital. Summary  Cellulitis is a skin infection. The area is often warm, red, swollen, and sore.  This condition is treated with medicines, rest, and cold and warm cloths.  Take  all medicines only as told by your doctor.  Tell your doctor if symptoms do not start to get better after 1-2 days of treatment. This information is not intended to replace advice given to you by your health care provider. Make sure you discuss any questions you have with your health care provider. Document Revised: 12/24/2017 Document Reviewed: 12/24/2017 Elsevier Patient Education  2021 [employer] Inc[employer] Skin Abscess  A skin abscess is an infected area of your skin that contains pus and other material. An abscess can happen in any part of your body. Some abscesses break open (rupture) on  their own. Most continue to get worse unless they are treated. The infection can spread deeper into the body and into your blood, which can make you feel sick. A skin abscess is caused by germs that enter the skin through a cut or scrape. It can also be caused by blocked oil and sweat glands or infected hair follicles. This condition is usually treated by:  Draining the pus.  Taking antibiotic medicines.  Placing a warm, wet washcloth over the abscess. Follow these instructions at home: Medicines  Take over-the-counter and prescription medicines only as told by your doctor.  If you were prescribed an antibiotic medicine, take it as told by your doctor. Do not stop taking the antibiotic even if you start to feel better.   Abscess care  If you have an abscess that has not drained, place a warm, clean, wet washcloth over the abscess several times a day. Do this as told by your doctor.  Follow instructions from your doctor about how to take care of your abscess. Make sure you: ? Cover the abscess with a bandage (dressing). ? Change your bandage or gauze as told by your doctor. ? Wash your hands with soap and water before you change the bandage or gauze. If you cannot use soap and water, use hand sanitizer.  Check your abscess every day for signs that the infection is getting worse. Check for: ? More redness, swelling, or pain. ? More fluid or blood. ? Warmth. ? More pus or a bad smell.   General instructions  To avoid spreading the infection: ? Do not share personal care items, towels, or hot tubs with others. ? Avoid making skin-to-skin contact with other people.  Keep all follow-up visits as told by your doctor. This is important. Contact a doctor if:  You have more redness, swelling, or pain around your abscess.  You have more fluid or blood coming from your abscess.  Your abscess feels warm when you touch it.  You have more pus or a bad smell coming from your  abscess.  You have a fever.  Your muscles ache.  You have chills.  You feel sick. Get help right away if:  You have very bad (severe) pain.  You see red streaks on your skin spreading away from the abscess. Summary  A skin abscess is an infected area of your skin that contains pus and other material.  The abscess is caused by germs that enter the skin through a cut or scrape. It can also be caused by blocked oil and sweat glands or infected hair follicles.  Follow your doctor's instructions on caring for your abscess, taking medicines, preventing infections, and keeping follow-up visits. This information is not intended to replace advice given to you by your health care provider. Make sure you discuss any questions you have with your health care provider. Document Revised: 03/10/2019 Document Reviewed: 09/17/2017  Elsevier Patient Education  2021 Elsevier Inc.  

## 2021-01-24 ENCOUNTER — Encounter: Payer: Self-pay | Admitting: Physician Assistant

## 2021-01-26 ENCOUNTER — Encounter: Payer: Self-pay | Admitting: Family Medicine

## 2021-01-28 MED ORDER — TRAMADOL HCL 50 MG PO TABS: 50.0000 mg | ORAL_TABLET | Freq: Three times a day (TID) | ORAL | 0 refills | Status: AC | PRN

## 2021-01-28 NOTE — Telephone Encounter (Signed)
Tramadol sent. Not sure if still in pain since sent over the weekend.

## 2021-02-15 ENCOUNTER — Other Ambulatory Visit: Payer: Self-pay | Admitting: Family Medicine

## 2021-02-25 ENCOUNTER — Ambulatory Visit (INDEPENDENT_AMBULATORY_CARE_PROVIDER_SITE_OTHER): Payer: BC Managed Care – PPO | Admitting: Family Medicine

## 2021-02-25 ENCOUNTER — Encounter: Payer: Self-pay | Admitting: Family Medicine

## 2021-02-25 ENCOUNTER — Other Ambulatory Visit: Payer: Self-pay

## 2021-02-25 DIAGNOSIS — K122 Cellulitis and abscess of mouth: Secondary | ICD-10-CM | POA: Diagnosis not present

## 2021-02-25 MED ORDER — HYDROCODONE-ACETAMINOPHEN 5-325 MG PO TABS: 1.0000 | ORAL_TABLET | Freq: Three times a day (TID) | ORAL | 0 refills | Status: DC | PRN

## 2021-02-25 MED ORDER — CLINDAMYCIN HCL 300 MG PO CAPS: 300.0000 mg | ORAL_CAPSULE | Freq: Three times a day (TID) | ORAL | 0 refills | Status: AC

## 2021-02-25 NOTE — Assessment & Plan Note (Signed)
This is a pleasant 50 year old male, he has a history of some dental work being done, he gets frequent gingival abscesses. He has had them drained by providers here in the office. Today we cleaned the area and drained it with an 18-gauge needle, this will be sent off for culture, adding clindamycin, hydrocodone. Return to see [place] in about a week.

## 2021-02-25 NOTE — Progress Notes (Signed)
Acute Office Visit  Subjective:    Patient ID: Christian Gregory, male    DOB: [birth year], 50 y.o.   MRN: [id number]  Chief Complaint  Patient presents with   Oral Pain    HPI Patient is in today for oral abscess.   Four days ago, patient noticed some pain and swelling to upper gum near frenulum. States he has a bridge for front teeth that left a gap in gums which sometimes becomes filled with food despite trying to flush out. Pain is 7/10 and he believes it is infected. States someone in our office drained something similar in the past and he would like to have it drained, otherwise he plans to drain it himself because it is so painful. He has not yet had any drainage from it, fevers, malaise, etc. Reports he sees a dentist regularly.     Past Medical History:  Diagnosis Date   Anxiety    anger problems   Eosinophilic esophagitis    Fatty liver    History of methicillin resistant staphylococcus aureus (MRSA)    Hypertension    Obesity     Past Surgical History:  Procedure Laterality Date   ESOPHAGEAL DILATION      Family History  Problem Relation Age of Onset   Hypertension Mother        father   Stroke Father     Social History   Socioeconomic History   Marital status: Married    Spouse name: Not on file   Number of children: Not on file   Years of education: Not on file   Highest education level: Not on file  Occupational History   Not on file  Tobacco Use   Smoking status: Never   Smokeless tobacco: Never  Substance and Sexual Activity   Alcohol use: Yes    Comment: 6 pack of beer 2-3 x a week   Drug use: No   Sexual activity: Yes  Other Topics Concern   Not on file  Social History Narrative   Not on file   Social Determinants of Health   Financial Resource Strain: Not on file  Food Insecurity: Not on file  Transportation Needs: Not on file  Physical Activity: Not on file  Stress: Not on file  Social Connections: Not on file  Intimate Partner  Violence: Not on file    Outpatient Medications Prior to Visit  Medication Sig Dispense Refill   albuterol (VENTOLIN HFA) 108 (90 Base) MCG/ACT inhaler TAKE 2 PUFFS BY MOUTH EVERY 6 HOURS AS NEEDED FOR WHEEZE OR SHORTNESS OF BREATH 8.5 each 2   amphetamine-dextroamphetamine (ADDERALL XR) 30 MG 24 hr capsule Take 1 capsule (30 mg total) by mouth every morning. 30 capsule 0   [START ON 03/10/2021] amphetamine-dextroamphetamine (ADDERALL XR) 30 MG 24 hr capsule Take 1 capsule (30 mg total) by mouth every morning. 30 capsule 0   amphetamine-dextroamphetamine (ADDERALL XR) 30 MG 24 hr capsule Take 1 capsule (30 mg total) by mouth every morning. 30 capsule 0   azelastine (ASTELIN) 0.1 % nasal spray Place 2 sprays into both nostrils 2 (two) times daily. Use in each nostril as directed 30 mL 2   buPROPion (WELLBUTRIN XL) 150 MG 24 hr tablet TAKE 1 TABLET BY MOUTH EVERY DAY 90 tablet 1   doxycycline (VIBRA-TABS) 100 MG tablet Take 1 tablet (100 mg total) by mouth 2 (two) times daily. 20 tablet 0   EPINEPHrine 0.3 mg/0.3 mL IJ SOAJ injection Inject 0.3 mg  into the muscle as needed for anaphylaxis. 1 each 2   Fluticasone-Umeclidin-Vilant (TRELEGY ELLIPTA) 200-62.5-25 MCG/INH AEPB Inhale 1 puff into the lungs daily. 60 each 11   Insulin Pen Needle 32G X 4 MM MISC Use to inject into the skin daily 100 each prn   Mepolizumab (NUCALA) 100 MG/ML SOAJ Inject 1 mL (100 mg total) into the skin every 28 (twenty-eight) days. Deliver to patient's home for self-administration 1 mL 5   metoprolol succinate (TOPROL-XL) 25 MG 24 hr tablet TAKE 1 TABLET BY MOUTH EVERY DAY 90 tablet 0   OZEMPIC, 0.25 OR 0.5 MG/DOSE, 2 MG/1.5ML SOPN INJECT 0.25 MG INTO THE SKIN ONCE A WEEK. THEN INCREASE TO 0.5MG  WEEKLY AFTER ONE MONTH 1.5 mL 2   testosterone cypionate (DEPOTESTOSTERONE CYPIONATE) 200 MG/ML injection Inject 1 mL (200 mg total) into the muscle every 14 (fourteen) days. 6 mL 0   No facility-administered medications prior to  visit.    Allergies  Allergen Reactions   Penicillins Anaphylaxis and Swelling    REACTION: anaphalatic     Lisinopril Other (See Comments)    ED   Losartan Other (See Comments)    Dizziness and leg pain    Review of Systems All review of systems negative except what is listed in the HPI     Objective:    Physical Exam Constitutional:      Appearance: Normal appearance.  HENT:     Head:     Comments: < 1 cm area of mild erythema and inflammation to upper gum near frenulum. See picture.  Lymphadenopathy:     Cervical: No cervical adenopathy.  Neurological:     Mental Status: He is alert.        BP (!) 165/98   Pulse 61   Temp 97.9 F (36.6 C)   Resp 16   SpO2 100%  Wt Readings from Last 3 Encounters:  01/23/21 239 lb (108.4 kg)  01/10/21 241 lb (109.3 kg)  11/19/20 244 lb 9.6 oz (110.9 kg)    Health Maintenance Due  Topic Date Due   COVID-19 Vaccine (1) Never done   Pneumococcal Vaccine 24-79 Years old (1 - PCV) Never done   Hepatitis C Screening  Never done   Zoster Vaccines- Shingrix (1 of 2) Never done   COLONOSCOPY (Pts 45-70yrs Insurance coverage will need to be confirmed)  Never done    There are no preventive care reminders to display for this patient.   Lab Results  Component Value Date   TSH 2.542 09/02/2012   Lab Results  Component Value Date   WBC 9.6 11/06/2020   HGB 17.7 (H) 11/06/2020   HCT 51.6 (H) 11/06/2020   MCV 91.8 11/06/2020   PLT 394 11/06/2020   Lab Results  Component Value Date   NA 138 11/06/2020   K 4.6 11/06/2020   CO2 29 11/06/2020   GLUCOSE 111 (H) 11/06/2020   BUN 20 11/06/2020   CREATININE 1.06 11/06/2020   BILITOT 0.6 11/06/2020   ALKPHOS 58 01/05/2017   AST 30 11/06/2020   ALT 57 (H) 11/06/2020   PROT 7.3 11/06/2020   ALBUMIN 4.4 01/05/2017   CALCIUM 9.6 11/06/2020   Lab Results  Component Value Date   CHOL 196 06/27/2020   Lab Results  Component Value Date   HDL 43 06/27/2020   Lab Results   Component Value Date   LDLCALC 126 (H) 06/27/2020   Lab Results  Component Value Date   TRIG 159 (H) 06/27/2020  Lab Results  Component Value Date   CHOLHDL 4.6 06/27/2020   Lab Results  Component Value Date   HGBA1C 5.5 08/31/2020       Assessment & Plan:   1. Abscess of oral tissue Dr. Karie Schwalbe drained abscess. See his procedure note. Patient tolerated well. Sending culture. Clindamycin and Norco sent in. Encouraged salt water rinses to keep the area clean. Recommend close follow-up with dentist. Patient aware of signs/symptoms requiring further/urgent evaluation.  BP elevated today, but patient in pain, recheck no better after procedure (also painful). States readings at home are normal. Recommend he keep monitoring home BP and notify us if > 140/90 at home.   - clindamycin (CLEOCIN) 300 MG capsule; Take 1 capsule (300 mg total) by mouth 3 (three) times daily for 10 days.  Dispense: 30 capsule; Refill: 0 - WOUND CULTURE - HYDROcodone-acetaminophen (NORCO/VICODIN) 5-325 MG tablet; Take 1 tablet by mouth every 8 (eight) hours as needed for moderate pain.  Dispense: 15 tablet; Refill: 0  Follow-up if symptoms return.   Lollie Marrow Reola Calkins, DNP, FNP-C

## 2021-02-25 NOTE — Patient Instructions (Signed)
Antibiotics and pain management.  Will send for culture.

## 2021-02-25 NOTE — Progress Notes (Signed)
    Procedures performed today:    Incision and drainage of oral abscess. Risks, benefits, and alternatives explained and consent obtained. Time out conducted. Surface cleaned with alcohol. Area prepped and draped in a sterile fashion. I used an 18-gauge needle to make a stab incision into the abscess. Pus expressed with pressure. Culture obtained. Pt stable. Aftercare and follow-up advised.  Independent interpretation of notes and tests performed by another provider:   None.  Brief History, Exam, Impression, and Recommendations:    Abscess of oral tissue This is a pleasant 50 year old male, he has a history of some dental work being done, he gets frequent gingival abscesses. He has had them drained by providers here in the office. Today we cleaned the area and drained it with an 18-gauge needle, this will be sent off for culture, adding clindamycin, hydrocodone. Return to see [place] in about a week.    ___________________________________________ Ihor Austin. Benjamin Stain, M.D., ABFM., CAQSM. Primary Care and Sports Medicine [facility]  Adjunct Instructor of Family Medicine  [employer]

## 2021-02-26 ENCOUNTER — Encounter: Payer: Self-pay | Admitting: Family Medicine

## 2021-02-27 ENCOUNTER — Other Ambulatory Visit: Payer: Self-pay | Admitting: Family Medicine

## 2021-02-27 LAB — EXTRA SPECIMEN

## 2021-02-27 LAB — WOUND CULTURE

## 2021-03-18 ENCOUNTER — Other Ambulatory Visit: Payer: Self-pay | Admitting: Family Medicine

## 2021-03-18 ENCOUNTER — Other Ambulatory Visit: Payer: Self-pay | Admitting: Pulmonary Disease

## 2021-03-18 DIAGNOSIS — T380X5D Adverse effect of glucocorticoids and synthetic analogues, subsequent encounter: Secondary | ICD-10-CM

## 2021-03-18 DIAGNOSIS — E099 Drug or chemical induced diabetes mellitus without complications: Secondary | ICD-10-CM

## 2021-04-17 ENCOUNTER — Other Ambulatory Visit: Payer: Self-pay | Admitting: Family Medicine

## 2021-05-03 ENCOUNTER — Other Ambulatory Visit: Payer: Self-pay | Admitting: Family Medicine

## 2021-05-14 NOTE — Progress Notes (Signed)
@Patient  ID: , male    DOB: 12/03/1970, 50 y.o.   MRN: [id number]  Chief Complaint  Patient presents with   Consult    Referred by PCP for asthma. States he last received a Nucala back in December 2021 at [facility]. Was seen by a pulmonologist there but could make it back for the injections. Increased wheezing.     Referring provider: [facility], *  HPI:   50 year old man with past medical history of asthma and EDPA treated with Nucala lost to follow-up at [facility] who is here for consultation for evaluation of asthma.  PCP note reviewed.  Most recent pulmonary note at [facility] reviewed.  Patient with longstanding history of asthma.  Recurrent exacerbations.  Was placed on Nucala in the past.  This greatly reduced burden of symptoms.  Really was not using inhalers very much.  Was lost to follow-up at [facility].  Nucala last dose in 2021.  In the interim, he has developed worsening shortness of breath.  Worsening cough.  Wheeze.  Worse in the evenings.  Symptoms worse with inclines or stairs.  Worse when outside.  Worse with change of seasons.  No other relieving or exacerbating factors.  Most recent CT chest 02/06/2016 reviewed and interpreted as clear lungs bilaterally.  Patient has recurrent sinusitis.  Underwent sinus surgery August 2017.  No eosinophils.  Despite this he can have frequent exacerbations.  Need to start antibiotics frequently.  History with high-dose steroids and methotrexate in the past.  Methotrexate irritated his stomach.  This is back in 2018.  ANCA was positive at that time.  E GPA  was clinically diagnosed based on clinical criteria.  Unfortunately his was not very steroid responsive.  1 point time he was on prednisone 10 mg daily as well as Imuran 50 mg daily.  This got a large acute pulmonary notes.  Eventually he was transitioned to [facility] with better control of sinus as well as breathing issues.  Tried multiple different inhalers.  Currently not taking any other  immunosuppressive medicines  PMH: Asthma, attention deficit disorder Surgical history: Esophageal dilation Family history: Hypertension in father, CVA Father Social history: Never smoker, lives in [place] / Pulmonary Flowsheets:   ACT:  No flowsheet data found.  MMRC: No flowsheet data found.  Epworth:  No flowsheet data found.  Tests:   FENO:  No results found for: NITRICOXIDE  PFT: No flowsheet data found.  WALK:  No flowsheet data found.  Imaging: Personally reviewed as per EMR discussion this note  Lab Results: Personally reviewed CBC    Component Value Date/Time   WBC 9.6 11/06/2020 0000   RBC 5.62 11/06/2020 0000   HGB 17.7 (H) 11/06/2020 0000   HCT 51.6 (H) 11/06/2020 0000   PLT 394 11/06/2020 0000   MCV 91.8 11/06/2020 0000   MCH 31.5 11/06/2020 0000   MCHC 34.3 11/06/2020 0000   RDW 12.1 11/06/2020 0000   LYMPHSABS 2,378 01/05/2017 1534   MONOABS 902 01/05/2017 1534   EOSABS 656 (H) 01/05/2017 1534   BASOSABS 82 01/05/2017 1534    BMET    Component Value Date/Time   NA 138 11/06/2020 0000   K 4.6 11/06/2020 0000   CL 100 11/06/2020 0000   CO2 29 11/06/2020 0000   GLUCOSE 111 (H) 11/06/2020 0000   BUN 20 11/06/2020 0000   CREATININE 1.06 11/06/2020 0000   CALCIUM 9.6 11/06/2020 0000   GFRNONAA 81 11/06/2020 0000   GFRAA 94 11/06/2020  0000    BNP No results found for: BNP  ProBNP No results found for: PROBNP  Specialty Problems   None   Allergies  Allergen Reactions   Penicillins Anaphylaxis and Swelling    REACTION: anaphalatic     Lisinopril Other (See Comments)    ED   Losartan Other (See Comments)    Dizziness and leg pain    Immunization History  Administered Date(s) Administered   Influenza,inj,Quad PF,6+ Mos 07/07/2017   Influenza-Unspecified 06/01/2018   Tdap 07/20/2018    Past Medical History:  Diagnosis Date   Anxiety    anger problems   Eosinophilic esophagitis    Fatty liver     History of methicillin resistant staphylococcus aureus (MRSA)    Hypertension    Obesity     Tobacco History: Social History   Tobacco Use  Smoking Status Never  Smokeless Tobacco Never   Counseling given: Not Answered   Continue to not smoke  Outpatient Encounter Medications as of 11/19/2020  Medication Sig   Fluticasone-Umeclidin-Vilant (TRELEGY ELLIPTA) 200-62.5-25 MCG/INH AEPB Inhale 1 puff into the lungs daily.   Insulin Pen Needle 32G X 4 MM MISC Use to inject into the skin daily   [DISCONTINUED] albuterol (VENTOLIN HFA) 108 (90 Base) MCG/ACT inhaler TAKE 2 PUFFS BY MOUTH EVERY 6 HOURS AS NEEDED FOR WHEEZE OR SHORTNESS OF BREATH   [DISCONTINUED] amphetamine-dextroamphetamine (ADDERALL XR) 30 MG 24 hr capsule Take 1 capsule (30 mg total) by mouth every morning.   [DISCONTINUED] buPROPion (WELLBUTRIN XL) 150 MG 24 hr tablet TAKE 1 TABLET BY MOUTH EVERY DAY   [DISCONTINUED] DULERA 200-5 MCG/ACT AERO Inhale 2 puffs into the lungs 2 (two) times a day.   [DISCONTINUED] metoprolol succinate (TOPROL-XL) 25 MG 24 hr tablet TAKE ONE TAB BY MOUTH DAILY   [DISCONTINUED] omeprazole (PRILOSEC) 40 MG capsule TAKE 1 CAPSULE BY MOUTH EVERY DAY   [DISCONTINUED] Semaglutide,0.25 or 0.5MG /DOS, (OZEMPIC, 0.25 OR 0.5 MG/DOSE,) 2 MG/1.5ML SOPN Inject 0.25 mg into the skin once a week. Then increase to 0.5mg  weekly after one month   [DISCONTINUED] testosterone cypionate (DEPOTESTOSTERONE CYPIONATE) 200 MG/ML injection Inject 1 mL (200 mg total) into the muscle every 14 (fourteen) days.   [DISCONTINUED] tiotropium (SPIRIVA HANDIHALER) 18 MCG inhalation capsule Place 1 capsule (18 mcg total) into inhaler and inhale daily.   No facility-administered encounter medications on file as of 11/19/2020.     Review of Systems  Review of Systems  No chest pain with exertion.  No orthopnea or PND.  Comprehensive review of systems otherwise negative. Physical Exam  BP 138/86   Pulse 80   Temp 97.9 F (36.6  C) (Temporal)   Ht 5\' 5"  (1.651 m)   Wt 244 lb 9.6 oz (110.9 kg)   SpO2 98% Comment: on RA  BMI 40.70 kg/m   Wt Readings from Last 5 Encounters:  01/23/21 239 lb (108.4 kg)  01/10/21 241 lb (109.3 kg)  11/19/20 244 lb 9.6 oz (110.9 kg)  11/06/20 239 lb (108.4 kg)  10/12/20 236 lb (107 kg)    BMI Readings from Last 5 Encounters:  01/23/21 39.77 kg/m  01/10/21 40.10 kg/m  11/19/20 40.70 kg/m  11/06/20 39.77 kg/m  10/12/20 39.27 kg/m     Physical Exam General: In exam chair, no acute distress Eyes: EOMI, icterus Neck: Supple, no JVP Pulmonary: Wheezing bilaterally, normal work of breathing Cardiovascular: Regular rate and rhythm, no murmur Abdomen: Nondistended, bowel sounds present MSK: No synovitis, no joint effusion Neuro: Normal gait,  no weakness Psych: Normal mood, full affect.   Assessment & Plan:   Asthma: Present for many years.  Not well controlled on current maintenance inhaler.  Escalate to Trelegy for ICS/LABA/LAMA therapy.  Lost to follow-up in the [facility] system.  Previously administered Nucala.  Eosinophils were reviewed and chronically elevated.  We will start process for Nucala administration.  Dyspnea on exertion: Suspect related to poorly controlled asthma.  See above.  E GPA: Diagnosed clinically at [facility].  ANCA positive in the past.  Significant sinus issues.  Resume Nucala.  May need to reengage rheumatology in the future.   Return in about 3 months (around 02/18/2021).   Karren Burly, MD

## 2021-05-16 ENCOUNTER — Encounter: Payer: Self-pay | Admitting: Family Medicine

## 2021-05-16 ENCOUNTER — Other Ambulatory Visit: Payer: Self-pay | Admitting: Family Medicine

## 2021-05-16 ENCOUNTER — Other Ambulatory Visit: Payer: Self-pay

## 2021-05-16 ENCOUNTER — Ambulatory Visit (INDEPENDENT_AMBULATORY_CARE_PROVIDER_SITE_OTHER): Payer: BC Managed Care – PPO | Admitting: Family Medicine

## 2021-05-16 VITALS — BP 150/93 | HR 78 | Ht 65.0 in | Wt 243.0 lb

## 2021-05-16 DIAGNOSIS — E099 Drug or chemical induced diabetes mellitus without complications: Secondary | ICD-10-CM

## 2021-05-16 DIAGNOSIS — M79632 Pain in left forearm: Secondary | ICD-10-CM

## 2021-05-16 DIAGNOSIS — F902 Attention-deficit hyperactivity disorder, combined type: Secondary | ICD-10-CM

## 2021-05-16 DIAGNOSIS — E291 Testicular hypofunction: Secondary | ICD-10-CM

## 2021-05-16 DIAGNOSIS — I10 Essential (primary) hypertension: Secondary | ICD-10-CM | POA: Diagnosis not present

## 2021-05-16 DIAGNOSIS — E785 Hyperlipidemia, unspecified: Secondary | ICD-10-CM

## 2021-05-16 MED ORDER — AMPHETAMINE-DEXTROAMPHET ER 30 MG PO CP24: 30.0000 mg | ORAL_CAPSULE | ORAL | 0 refills | Status: DC

## 2021-05-16 MED ORDER — SEMAGLUTIDE (1 MG/DOSE) 4 MG/3ML ~~LOC~~ SOPN: 1.0000 mg | PEN_INJECTOR | SUBCUTANEOUS | 2 refills | Status: DC

## 2021-05-16 MED ORDER — TESTOSTERONE CYPIONATE 200 MG/ML IM SOLN: 200.0000 mg | INTRAMUSCULAR | 0 refills | Status: DC

## 2021-05-16 NOTE — Assessment & Plan Note (Signed)
Due to recheck lipids in November.
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
## 2021-05-16 NOTE — Assessment & Plan Note (Signed)
Well controlled. Continue current regimen. Follow up in 4-6 months.  

## 2021-05-16 NOTE — Assessment & Plan Note (Signed)
1 pressure is high today but he reports that at home he has been getting blood pressures in the 130s just encouraged him to follow back up in a couple weeks for nurse visit so we can make sure that it is coming back down and then make any adjustments needed.

## 2021-05-16 NOTE — Assessment & Plan Note (Signed)
To recheck testosterone 7 to 10 days after next injection.  I did go ahead and refill the medication for him.  PSA and CBC are up-to-date.

## 2021-05-16 NOTE — Assessment & Plan Note (Signed)
Discussed options including increasing the Ozempic to 1 mg.  We had actually discussed at the last office visit and unfortunately I did not send over a new prescription so I did update that today.  Plan to recheck A1c at next appointment.  He is also hoping that it will help promote weight loss as well.  He is actually up about 4 pounds over the last 3 months.

## 2021-05-21 ENCOUNTER — Other Ambulatory Visit: Payer: Self-pay | Admitting: Family Medicine

## 2021-05-23 ENCOUNTER — Other Ambulatory Visit: Payer: Self-pay | Admitting: Pulmonary Disease

## 2021-05-23 DIAGNOSIS — D7218 Eosinophilia in diseases classified elsewhere: Secondary | ICD-10-CM

## 2021-05-23 DIAGNOSIS — J4540 Moderate persistent asthma, uncomplicated: Secondary | ICD-10-CM

## 2021-05-24 NOTE — Telephone Encounter (Signed)
Refill for Nucala 100mg  every 4 weeks sent to [facility] for 2 months only. Patietn started Nucala on 12/17/20 and has not had f/u with Dr. 02/16/21 since then.  Routing to triage to assist and Dr. Judeth Horn to advice on what procedures he'd like completed prior to appt  Judeth Horn, PharmD, MPH, BCPS Clinical Pharmacist (Rheumatology and Pulmonology)

## 2021-05-27 NOTE — Telephone Encounter (Signed)
Please have him see me next available, nothing needed prior to visit.

## 2021-06-09 ENCOUNTER — Other Ambulatory Visit: Payer: Self-pay | Admitting: Pulmonary Disease

## 2021-08-06 ENCOUNTER — Other Ambulatory Visit: Payer: Self-pay | Admitting: Family Medicine

## 2021-08-08 ENCOUNTER — Other Ambulatory Visit: Payer: Self-pay | Admitting: Family Medicine

## 2021-08-08 DIAGNOSIS — K2100 Gastro-esophageal reflux disease with esophagitis, without bleeding: Secondary | ICD-10-CM

## 2021-08-23 ENCOUNTER — Other Ambulatory Visit: Payer: Self-pay | Admitting: Family Medicine

## 2021-08-26 ENCOUNTER — Other Ambulatory Visit: Payer: Self-pay | Admitting: Family Medicine

## 2021-08-26 DIAGNOSIS — E291 Testicular hypofunction: Secondary | ICD-10-CM

## 2021-08-27 ENCOUNTER — Telehealth: Payer: Self-pay | Admitting: Pulmonary Disease

## 2021-08-28 NOTE — Telephone Encounter (Addendum)
Returned call to [employer] to [place] regarding PAP application. Per chart review, patient is not receiving through PAP. He is commercially insured. Pharmacy needs refill but patient needs appt for further refills. Routing to front desk to have patient scheduled.  Closed PAP application case - per rep, app is from April 2022  Chesley Mires, PharmD, MPH, BCPS Clinical Pharmacist (Rheumatology and Pulmonology)

## 2021-08-30 DIAGNOSIS — J4540 Moderate persistent asthma, uncomplicated: Secondary | ICD-10-CM

## 2021-08-30 DIAGNOSIS — D7218 Eosinophilia in diseases classified elsewhere: Secondary | ICD-10-CM

## 2021-08-30 DIAGNOSIS — M301 Polyarteritis with lung involvement [Churg-Strauss]: Secondary | ICD-10-CM

## 2021-08-30 NOTE — Telephone Encounter (Signed)
Left voicemail for patient to call back to schedule an appointment and sent mychart message.

## 2021-09-03 ENCOUNTER — Other Ambulatory Visit: Payer: Self-pay

## 2021-09-03 ENCOUNTER — Ambulatory Visit (INDEPENDENT_AMBULATORY_CARE_PROVIDER_SITE_OTHER): Payer: BC Managed Care – PPO | Admitting: Adult Health

## 2021-09-03 ENCOUNTER — Encounter: Payer: Self-pay | Admitting: Adult Health

## 2021-09-03 DIAGNOSIS — J4550 Severe persistent asthma, uncomplicated: Secondary | ICD-10-CM | POA: Diagnosis not present

## 2021-09-03 DIAGNOSIS — D7218 Eosinophilia in diseases classified elsewhere: Secondary | ICD-10-CM | POA: Diagnosis not present

## 2021-09-03 DIAGNOSIS — I7789 Other specified disorders of arteries and arterioles: Secondary | ICD-10-CM

## 2021-09-03 DIAGNOSIS — J310 Chronic rhinitis: Secondary | ICD-10-CM | POA: Insufficient documentation

## 2021-09-03 DIAGNOSIS — J45909 Unspecified asthma, uncomplicated: Secondary | ICD-10-CM | POA: Insufficient documentation

## 2021-09-03 NOTE — Assessment & Plan Note (Signed)
Patient is continue on current regimen.  May add in Zyrtec as needed.

## 2021-09-03 NOTE — Assessment & Plan Note (Signed)
Severe persistent allergic asthma-has been under good control with Trelegy and Nucala.  Unfortunately his Nucala prescription ran out.  And he is having increased symptoms off of this.  Prescription has been refilled patient is to start as soon as available  Plan  Patient Instructions  Continue on TRELEGY 1 puff daily  May use Zyrtec 10mg  At bedtime  As needed  drainage.  Albuterol inhaler As needed   Continue on Nucala monthly .  Follow up with Dr. in 6 months and As needed

## 2021-09-03 NOTE — Assessment & Plan Note (Signed)
Continue on current regimen with Nucala.

## 2021-09-03 NOTE — Progress Notes (Signed)
@Patient  ID: Christian Gregory, male    DOB: [birth year], 51 y.o.   MRN: [id number]  Chief Complaint  Patient presents with   Follow-up    Referring provider: Hali Marry, *  HPI: 51 year old seen for pulmonary consult November 19, 2020 for severe persistent allergic asthma with eosinophilia..  Previously evaluated at [facility] with previous diagnosis of eosinophilic granulomatosis with polyangiitis-previously on methotrexate, Imuran and steroids in 2018 with a positive ANCA.  Transitioned to Nucala History of chronic sinusitis status post sinus surgery August 2017  TEST/EVENTS :   09/03/2021 Follow up : Severe persistent allergic asthma Patient returns for a follow-up visit.  Last seen April 2022.  Patient has severe persistent allergic asthma with eosinophilia.  Previous clinical diagnosis of Eosinophilic granulomatosis with polyangitis with significant improvement on Nucala.  Patient says has been doing well up until about a month ago.  He ran out of his Nucala and needs a refill.  He says since being off he has noticed that his asthma and sinus symptoms have increased with nasal congestion stuffiness and intermittent cough.  Patient says he is very active.  He works full-time as a Engineer, technical sales.  He hunts and fishes.  Patient is a never smoker.  He denies any increased albuterol use.  Patient does do self injections at home with Nucala.  He also has EpiPen at home.   Is never had any known issues with his Nucala.    Allergies  Allergen Reactions   Penicillins Anaphylaxis and Swelling    REACTION: anaphalatic     Lisinopril Other (See Comments)    ED   Losartan Other (See Comments)    Dizziness and leg pain    Immunization History  Administered Date(s) Administered   Influenza,inj,Quad PF,6+ Mos 07/07/2017   Influenza-Unspecified 06/01/2018   Tdap 07/20/2018    Past Medical History:  Diagnosis Date   Anxiety    anger problems   Eosinophilic esophagitis     Fatty liver    History of methicillin resistant staphylococcus aureus (MRSA)    Hypertension    Obesity     Tobacco History: Social History   Tobacco Use  Smoking Status Never  Smokeless Tobacco Never   Counseling given: Not Answered   Outpatient Medications Prior to Visit  Medication Sig Dispense Refill   albuterol (VENTOLIN HFA) 108 (90 Base) MCG/ACT inhaler TAKE 2 PUFFS BY MOUTH EVERY 6 HOURS AS NEEDED FOR WHEEZE OR SHORTNESS OF BREATH 8.5 each 2   amphetamine-dextroamphetamine (ADDERALL XR) 30 MG 24 hr capsule Take 1 capsule (30 mg total) by mouth every morning. 30 capsule 0   EPINEPHrine 0.3 mg/0.3 mL IJ SOAJ injection Inject 0.3 mg into the muscle as needed for anaphylaxis. 1 each 2   Fluticasone-Umeclidin-Vilant (TRELEGY ELLIPTA) 200-62.5-25 MCG/INH AEPB Inhale 1 puff into the lungs daily. 60 each 11   Insulin Pen Needle 32G X 4 MM MISC Use to inject into the skin daily 100 each prn   Mepolizumab (NUCALA) 100 MG/ML SOAJ INJECT THE CONTENTS OF 1 PEN (100 MG) UNDER THE SKIN EVERY 4 WEEKS 1 mL 1   metoprolol succinate (TOPROL-XL) 25 MG 24 hr tablet TAKE 1 TABLET BY MOUTH EVERY DAY 90 tablet 0   omeprazole (PRILOSEC) 40 MG capsule TAKE 1 CAPSULE BY MOUTH EVERY DAY 90 capsule 1   OZEMPIC, 1 MG/DOSE, 4 MG/3ML SOPN INJECT 1 MG AS DIRECTED ONCE A WEEK. 9 mL 0   testosterone cypionate (DEPOTESTOSTERONE CYPIONATE) 200 MG/ML injection  INJECT 1 ML (200 MG TOTAL) INTO THE MUSCLE EVERY 14 DAYS 6 mL 0   amphetamine-dextroamphetamine (ADDERALL XR) 30 MG 24 hr capsule Take 1 capsule (30 mg total) by mouth every morning. (Patient not taking: Reported on 09/03/2021) 30 capsule 0   amphetamine-dextroamphetamine (ADDERALL XR) 30 MG 24 hr capsule Take 1 capsule (30 mg total) by mouth every morning. (Patient not taking: Reported on 09/03/2021) 30 capsule 0   buPROPion (WELLBUTRIN XL) 150 MG 24 hr tablet TAKE 1 TABLET BY MOUTH EVERY DAY (Patient not taking: Reported on 09/03/2021) 90 tablet 1    doxycycline (VIBRA-TABS) 100 MG tablet Take 1 tablet (100 mg total) by mouth 2 (two) times daily. 20 tablet 0   HYDROcodone-acetaminophen (NORCO/VICODIN) 5-325 MG tablet Take 1 tablet by mouth every 8 (eight) hours as needed for moderate pain. (Patient not taking: Reported on 09/03/2021) 15 tablet 0   No facility-administered medications prior to visit.     Review of Systems:   Constitutional:   No  weight loss, night sweats,  Fevers, chills, fatigue, or  lassitude.  HEENT:   No headaches,  Difficulty swallowing,  Tooth/dental problems, or  Sore throat,                No sneezing, itching, ear ache,  +nasal congestion, post nasal drip,   CV:  No chest pain,  Orthopnea, PND, swelling in lower extremities, anasarca, dizziness, palpitations, syncope.   GI  No heartburn, indigestion, abdominal pain, nausea, vomiting, diarrhea, change in bowel habits, loss of appetite, bloody stools.   Resp:   No chest wall deformity  Skin: no rash or lesions.  GU: no dysuria, change in color of urine, no urgency or frequency.  No flank pain, no hematuria   MS:  No joint pain or swelling.  No decreased range of motion.  No back pain.    Physical Exam  BP 134/90 (BP Location: Left Arm, Patient Position: Sitting, Cuff Size: Large)    Pulse 93    Temp 98.3 F (36.8 C) (Oral)    Ht 5' 7.5" (1.715 m)    Wt 247 lb (112 kg)    SpO2 97%    BMI 38.11 kg/m   GEN: A/Ox3; pleasant , NAD, well nourished    HEENT:  Orient/AT,  , NOSE-clear drainage , THROAT-clear, no lesions, no postnasal drip or exudate noted.   NECK:  Supple w/ fair ROM; no JVD; normal carotid impulses w/o bruits; no thyromegaly or nodules palpated; no lymphadenopathy.    RESP  Clear  P & A; w/o, wheezes/ rales/ or rhonchi. no accessory muscle use, no dullness to percussion  CARD:  RRR, no m/r/g, no peripheral edema, pulses intact, no cyanosis or clubbing.  GI:   Soft & nt; nml bowel sounds; no organomegaly or masses detected.   Musco: Warm  bil, no deformities or joint swelling noted.   Neuro: alert, no focal deficits noted.    Skin: Warm, no lesions or rashes    Lab Results:  CBC     BNP No results found for: BNP  ProBNP No results found for: PROBNP  Imaging: No results found.    No flowsheet data found.  No results found for: NITRICOXIDE      Assessment & Plan:   Asthma, allergic Severe persistent allergic asthma-has been under good control with Trelegy and Nucala.  Unfortunately his Nucala prescription ran out.  And he is having increased symptoms off of this.  Prescription has been refilled patient  is to start as soon as available  Plan  Patient Instructions  Continue on TRELEGY 1 puff daily  May use Zyrtec 10mg  At bedtime  As needed  drainage.  Albuterol inhaler As needed   Continue on Nucala monthly .  Follow up with Dr. Silas Flood in 6 months and As needed        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[place]) Continue on current regimen with Nucala.  Chronic rhinitis Patient is continue on current regimen.  May add in Zyrtec as needed.     Rexene Edison, NP 09/03/2021

## 2021-09-03 NOTE — Patient Instructions (Signed)
Continue on TRELEGY 1 puff daily  May use Zyrtec 10mg  At bedtime  As needed  drainage.  Albuterol inhaler As needed   Continue on Nucala monthly .  Follow up with Dr. in 6 months and As needed

## 2021-09-09 ENCOUNTER — Other Ambulatory Visit: Payer: Self-pay | Admitting: Family Medicine

## 2021-09-09 DIAGNOSIS — F902 Attention-deficit hyperactivity disorder, combined type: Secondary | ICD-10-CM

## 2021-09-09 MED ORDER — AMPHETAMINE-DEXTROAMPHET ER 30 MG PO CP24: 30.0000 mg | ORAL_CAPSULE | ORAL | 0 refills | Status: DC

## 2021-09-22 ENCOUNTER — Encounter: Payer: Self-pay | Admitting: Family Medicine

## 2021-09-22 DIAGNOSIS — F902 Attention-deficit hyperactivity disorder, combined type: Secondary | ICD-10-CM

## 2021-09-23 MED ORDER — MYDAYIS 37.5 MG PO CP24: 1.0000 | ORAL_CAPSULE | Freq: Every day | ORAL | 0 refills | Status: DC

## 2021-09-23 MED ORDER — NUCALA 100 MG/ML ~~LOC~~ SOAJ: SUBCUTANEOUS | 5 refills | Status: DC

## 2021-09-23 NOTE — Telephone Encounter (Signed)
Meds ordered this encounter  Medications   Amphet-Dextroamphet 3-Bead ER (MYDAYIS) 37.5 MG CP24    Sig: Take 1 capsule by mouth daily.    Dispense:  30 capsule    Refill:  0

## 2021-09-23 NOTE — Telephone Encounter (Signed)
Please see mychart message sent by pt and advise. 

## 2021-09-26 ENCOUNTER — Telehealth: Payer: Self-pay | Admitting: Family Medicine

## 2021-09-26 ENCOUNTER — Telehealth: Payer: Self-pay

## 2021-09-26 DIAGNOSIS — Z7689 Persons encountering health services in other specified circumstances: Secondary | ICD-10-CM

## 2021-09-26 MED ORDER — METHYLPHENIDATE HCL ER (OSM) 27 MG PO TBCR: 27.0000 mg | EXTENDED_RELEASE_TABLET | ORAL | 0 refills | Status: DC

## 2021-09-26 NOTE — Telephone Encounter (Signed)
Call patient: We did receive the fax from the insurance company.  It does list several medications that can be used for ADD.  Interestingly the Mydayis is listed on there even though when the pharmacy is trying to run it they are saying it needs an authorization.  So that is a little strange but we have still initiated a prior off.  In the meantime we can see if they will cover Concerta.  Again it is in the paperwork that was sent but I still cannot guarantee that they may not require an authorization just like they do for the other 1.  But lets give it a try.  New prescription sent to the pharmacy.  It is also called methylphenidate and has a generic available.

## 2021-09-26 NOTE — Telephone Encounter (Signed)
Mychart message sent to patient regarding prior auth for Mydayis

## 2021-09-26 NOTE — Telephone Encounter (Addendum)
Initiated Prior authorization YIR:[id number] 3-Bead ER (MYDAYIS) 37.5 MG CP24 Via: Covermymeds Case/Key:[id number] Status: approved as of 09/26/21 Reason:Effective from 09/26/2021 through 09/25/2022. Notified Pt via: Mychart

## 2021-09-26 NOTE — Telephone Encounter (Signed)
Called to speak with patient for clarification. Phone rang several times then disconnected. Was unable to leave message

## 2021-09-26 NOTE — Telephone Encounter (Signed)
Fayrene Fearing Pies: Hey . Not sure if you can help. Trying to get scripts filled. Adderal xr is not to be found anywhere. Pharmacist told us an equivalent that is avail. Dr Glade Lloyd sent it in. I was told by her nurse yesterday that I had to call ins and get it added which Ive never had to do when there was a shortage. I did though and they called and sent email for me and Robie both. I havent heard anything back. Any way you could look into this for me ? If not I understand

## 2021-09-27 ENCOUNTER — Encounter: Payer: Self-pay | Admitting: Family Medicine

## 2021-09-27 MED ORDER — WEGOVY 1.7 MG/0.75ML ~~LOC~~ SOAJ: 1.7000 mg | SUBCUTANEOUS | 1 refills | Status: DC

## 2021-09-27 NOTE — Telephone Encounter (Signed)
Patient has been updated of provider's note and recommendation. Patient is agreeable with the plan of care. Patient was informed by the pharmacy that Wegovy rx has been approved by his insurance. Prior auth is valid until 10/07/21. Patient is requesting if provider can send in a refill for Wegovy. Thanks in advance.  °

## 2021-09-27 NOTE — Telephone Encounter (Signed)
Meds ordered this encounter  Medications   methylphenidate (CONCERTA) 27 MG PO CR tablet    Sig: Take 1 tablet (27 mg total) by mouth every morning.    Dispense:  30 tablet    Refill:  0   Semaglutide-Weight Management (WEGOVY) 1.7 MG/0.75ML SOAJ    Sig: Inject 1.7 mg into the skin every 7 (seven) days.    Dispense:  3 mL    Refill:  1

## 2021-09-27 NOTE — Telephone Encounter (Signed)
Patient has been updated of provider's note and recommendation. Patient is agreeable with the plan of care. Patient was informed by the pharmacy that [facility] rx has been approved by his insurance. Prior Josem Kaufmann is valid until 10/07/21. Patient is requesting if provider can send in a refill for [facility]. Thanks in advance.

## 2021-09-30 ENCOUNTER — Encounter: Payer: Self-pay | Admitting: Family Medicine

## 2021-09-30 NOTE — Telephone Encounter (Signed)
Thank you Loyal Gambler !

## 2021-10-09 ENCOUNTER — Encounter: Payer: Self-pay | Admitting: Family Medicine

## 2021-10-10 ENCOUNTER — Encounter: Payer: Self-pay | Admitting: Family Medicine

## 2021-10-12 ENCOUNTER — Encounter: Payer: Self-pay | Admitting: Family Medicine

## 2021-10-14 MED ORDER — AMPHETAMINE-DEXTROAMPHET ER 30 MG PO CP24: 30.0000 mg | ORAL_CAPSULE | ORAL | 0 refills | Status: DC

## 2021-10-21 ENCOUNTER — Telehealth: Payer: Self-pay

## 2021-10-21 NOTE — Telephone Encounter (Signed)
Per PA team, pt needs renewal for Nucala. ? ?Submitted a Prior Authorization request to [facility] for NUCALA via CoverMyMeds. Will update once we receive a response. ? ? ?Key: [id number] ?

## 2021-10-24 NOTE — Telephone Encounter (Signed)
Received request for additional info from [facility]. Completed continuation form and faxed back. Initial cover letter appeared to be for medical benefits, however there were instructions at the bottom of the page referencing the pharmacy benefits continuation form. This first page requested form be faxed to [phone], however the continuation form itself instructed to be sent to [phone]. Sent a fax to both numbers, will await response. ? ?Phone# [phone] ?

## 2021-10-29 NOTE — Telephone Encounter (Signed)
Received notification from [facility] regarding a prior authorization for Christian Gregory. Authorization has been APPROVED from 10/21/2021 to 10/20/2022.   ? ?Authorization # [id number] ?

## 2021-10-31 ENCOUNTER — Telehealth: Payer: Self-pay

## 2021-10-31 NOTE — Telephone Encounter (Addendum)
Initiated Prior authorization BJS:[id number] 1.7MG /0.75ML auto-injectors ?Via: Covermymeds ?Case/Key:[id number] ?Status: denied as of 10/31/2021 ?Reason:weight loss medications are excluded ?Notified Pt via: Mychart ?

## 2021-11-05 ENCOUNTER — Encounter: Payer: Self-pay | Admitting: Family Medicine

## 2021-11-13 ENCOUNTER — Ambulatory Visit: Payer: BC Managed Care – PPO | Admitting: Family Medicine

## 2021-11-15 ENCOUNTER — Telehealth: Payer: Self-pay

## 2021-11-15 ENCOUNTER — Ambulatory Visit: Payer: BC Managed Care – PPO | Admitting: Family Medicine

## 2021-11-15 NOTE — Telephone Encounter (Addendum)
Initiated Prior authorization XTG:GYIRSW 1.7MG /0.75ML auto-injectors ?Via: Covermymeds ?Case/Key:[id number] ?Status: approved as of 11/15/21 ?Reason: Effective from 11/15/2021 through 03/20/2022. ?Notified Pt via: Mychart ?

## 2021-11-17 ENCOUNTER — Other Ambulatory Visit: Payer: Self-pay | Admitting: Family Medicine

## 2021-11-18 ENCOUNTER — Other Ambulatory Visit: Payer: Self-pay | Admitting: *Deleted

## 2021-11-18 ENCOUNTER — Encounter: Payer: Self-pay | Admitting: Family Medicine

## 2021-11-18 DIAGNOSIS — Z7689 Persons encountering health services in other specified circumstances: Secondary | ICD-10-CM

## 2021-11-18 MED ORDER — WEGOVY 1.7 MG/0.75ML ~~LOC~~ SOAJ: 1.7000 mg | SUBCUTANEOUS | 1 refills | Status: DC

## 2021-11-26 ENCOUNTER — Ambulatory Visit: Payer: BC Managed Care – PPO | Admitting: Family Medicine

## 2021-12-16 ENCOUNTER — Encounter: Payer: Self-pay | Admitting: Family Medicine

## 2021-12-16 ENCOUNTER — Other Ambulatory Visit: Payer: Self-pay | Admitting: Pulmonary Disease

## 2021-12-16 MED ORDER — WEGOVY 2.4 MG/0.75ML ~~LOC~~ SOAJ: 2.4000 mg | SUBCUTANEOUS | 0 refills | Status: DC

## 2021-12-16 NOTE — Telephone Encounter (Signed)
New prescription sent for higher dose.  Needs to schedule a follow-up in about 4 weeks. ?

## 2021-12-17 ENCOUNTER — Telehealth: Payer: Self-pay

## 2021-12-17 NOTE — Telephone Encounter (Signed)
Initiated Prior authorization CWU:GQBVQX 2.4MG /0.75ML auto-injectors ?Via: Covermymeds ?Case/Key: [id number] ?Status: cancelled as of 12/17/21 ?Reason:We received your request for :Wegovy 2.4MG /0.75ML auto-injectors patient has already received an approval, Effective from 11/15/2021 through 03/20/2022. Please note re-certification is not due until after  03/21/22.   ?Notified Pt via: Mychart  ?

## 2021-12-19 ENCOUNTER — Ambulatory Visit (INDEPENDENT_AMBULATORY_CARE_PROVIDER_SITE_OTHER): Payer: BC Managed Care – PPO | Admitting: Family Medicine

## 2021-12-19 ENCOUNTER — Encounter: Payer: Self-pay | Admitting: Family Medicine

## 2021-12-19 VITALS — BP 165/84 | HR 72 | Resp 16 | Ht 67.5 in | Wt 241.0 lb

## 2021-12-19 DIAGNOSIS — I10 Essential (primary) hypertension: Secondary | ICD-10-CM

## 2021-12-19 DIAGNOSIS — Z7689 Persons encountering health services in other specified circumstances: Secondary | ICD-10-CM

## 2021-12-19 DIAGNOSIS — F902 Attention-deficit hyperactivity disorder, combined type: Secondary | ICD-10-CM

## 2021-12-19 DIAGNOSIS — E099 Drug or chemical induced diabetes mellitus without complications: Secondary | ICD-10-CM

## 2021-12-19 DIAGNOSIS — R7301 Impaired fasting glucose: Secondary | ICD-10-CM

## 2021-12-19 DIAGNOSIS — J4550 Severe persistent asthma, uncomplicated: Secondary | ICD-10-CM

## 2021-12-19 LAB — POCT UA - MICROALBUMIN
Albumin/Creatinine Ratio, Urine, POC: <30
Creatinine, POC: 50 mg/dL
Microalbumin Ur, POC: 10 mg/L

## 2021-12-19 LAB — POCT GLYCOSYLATED HEMOGLOBIN (HGB A1C): Hemoglobin A1C: 5.7 % — AB (ref 4.0–5.6)

## 2021-12-19 MED ORDER — AMPHETAMINE-DEXTROAMPHET ER 30 MG PO CP24: 30.0000 mg | ORAL_CAPSULE | ORAL | 0 refills | Status: DC

## 2021-12-19 MED ORDER — ALBUTEROL SULFATE HFA 108 (90 BASE) MCG/ACT IN AERS: INHALATION_SPRAY | RESPIRATORY_TRACT | 2 refills | Status: DC

## 2021-12-19 MED ORDER — AMPHETAMINE-DEXTROAMPHET ER 30 MG PO CP24: 30.0000 mg | ORAL_CAPSULE | Freq: Every day | ORAL | 0 refills | Status: DC

## 2021-12-19 NOTE — Assessment & Plan Note (Signed)
Well controlled. Continue current regimen. Follow up in  6 months.  

## 2021-12-19 NOTE — Assessment & Plan Note (Addendum)
Pressure elevated today.  Normally looks much better.  He is due for labs.  We will go ahead and place order today. ?

## 2021-12-19 NOTE — Assessment & Plan Note (Signed)
Been doing well on current regimen. ?

## 2021-12-19 NOTE — Progress Notes (Signed)
? ?Established Patient Office Visit ? ?Subjective   ?Patient ID: Christian Gregory, male    DOB: [birth year]  Age: 51 y.o. MRN: [id number] ? ?Chief Complaint  ?Patient presents with  ? ADHD  ?  Follow up   ? ? ?HPI ? ?Hypertension- Pt denies chest pain, SOB, dizziness, or heart palpitations.  Taking meds as directed w/o problems.  Denies medication side effects.   ? ?ADD - Reports symptoms are well controlled on current regime. Denies any problems with insomnia, chest pain, palpitations, or SOB.   ? ?Steroid Induced diabetes/weight management-currently on Wegovy.  He is doing really well.  Currently on 2.4 mg weekly.  Weight down to 241.  He does feel like it is helpful in curbing appetite.  He has not been drinking any alcohol which she was not drinking too much of before but he is completely cut that out. ? ? ? ?ROS ? ?  ?Objective:  ?  ? ?BP (!) 165/84 (BP Location: Left Arm)   Pulse 72   Resp 16   Ht 5' 7.5" (1.715 m)   Wt 241 lb (109.3 kg)   SpO2 98%   BMI 37.19 kg/m?  ? ? ?Physical Exam ?Constitutional:   ?   Appearance: He is well-developed.  ?HENT:  ?   Head: Normocephalic and atraumatic.  ?Cardiovascular:  ?   Rate and Rhythm: Normal rate and regular rhythm.  ?   Heart sounds: Normal heart sounds.  ?Pulmonary:  ?   Effort: Pulmonary effort is normal.  ?   Breath sounds: Normal breath sounds.  ?Skin: ?   General: Skin is warm and dry.  ?Neurological:  ?   Mental Status: He is alert and oriented to person, place, and time.  ?Psychiatric:     ?   Behavior: Behavior normal.  ? ? ? ?Results for orders placed or performed in visit on 12/19/21  ?POCT HgB A1C  ?Result Value Ref Range  ? Hemoglobin A1C 5.7 (A) 4.0 - 5.6 %  ? HbA1c POC (<> result, manual entry)    ? HbA1c, POC (prediabetic range)    ? HbA1c, POC (controlled diabetic range)    ?POCT UA - Microalbumin  ?Result Value Ref Range  ? Microalbumin Ur, POC 10 mg/L  ? Creatinine, POC 50 mg/dL  ? Albumin/Creatinine Ratio, Urine, POC <30   ? ? ? ? ?The 10-year  ASCVD risk score (Arnett DK, et al., 2019) is: 14.7% ? ?  ?Assessment & Plan:  ? ?Problem List Items Addressed This Visit   ? ?  ? Cardiovascular and Mediastinum  ? ESSENTIAL HYPERTENSION, BENIGN  ?  Pressure elevated today.  Normally looks much better.  He is due for labs.  We will go ahead and place order today. ? ?  ?  ? Relevant Orders  ? Lipid Panel w/reflex Direct LDL  ? COMPLETE METABOLIC PANEL WITH GFR  ? CBC  ?  ? Respiratory  ? Asthma, allergic  ?  Sent on albuterol inhaler.  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 ?  ?  ? Relevant Medications  ? albuterol (VENTOLIN HFA) 108 (90 Base) MCG/ACT inhaler  ?  ? Endocrine  ? Steroid-induced diabetes (HCC)  ?  A1C 5.7 today.  He is back on the [facility] and doing well with it.  Just encouraged him to continue to work on portion control of his carbs and sweets.  He is completely cut out alcohol. ? ?  ?  ?  Relevant Orders  ? Lipid Panel w/reflex Direct LDL  ? COMPLETE METABOLIC PANEL WITH GFR  ? CBC  ?  ? Other  ? Encounter for weight management  ?  Been doing well on current regimen. ? ?  ?  ? Attention deficit hyperactivity disorder (ADHD), combined type - Primary  ?  Well controlled. Continue current regimen. Follow up in  6 months.   ? ?  ?  ? Relevant Medications  ? amphetamine-dextroamphetamine (ADDERALL XR) 30 MG 24 hr capsule (Start on 02/16/2022)  ? amphetamine-dextroamphetamine (ADDERALL XR) 30 MG 24 hr capsule (Start on 01/18/2022)  ? amphetamine-dextroamphetamine (ADDERALL XR) 30 MG 24 hr capsule  ? ?Other Visit Diagnoses   ? ? Impaired fasting glucose      ? Relevant Orders  ? POCT HgB A1C (Completed)  ? POCT UA - Microalbumin (Completed)  ? Lipid Panel w/reflex Direct LDL  ? COMPLETE METABOLIC PANEL WITH GFR  ? CBC  ? ?  ? ? ?Return in about 4 months (around 04/21/2022) for Weight loss medication .  ? ? ?Nani Gasser, MD ? ?

## 2021-12-19 NOTE — Assessment & Plan Note (Addendum)
A1C 5.7 today.  He is back on the [facility] and doing well with it.  Just encouraged him to continue to work on portion control of his carbs and sweets.  He is completely cut out alcohol. ?

## 2021-12-19 NOTE — Assessment & Plan Note (Signed)
Sent on albuterol inhaler.  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

## 2021-12-22 ENCOUNTER — Encounter: Payer: Self-pay | Admitting: Family Medicine

## 2021-12-22 ENCOUNTER — Other Ambulatory Visit: Payer: Self-pay | Admitting: Family Medicine

## 2021-12-22 DIAGNOSIS — I10 Essential (primary) hypertension: Secondary | ICD-10-CM

## 2021-12-22 DIAGNOSIS — E291 Testicular hypofunction: Secondary | ICD-10-CM

## 2021-12-23 MED ORDER — ALPRAZOLAM 0.5 MG PO TABS: 0.5000 mg | ORAL_TABLET | Freq: Every evening | ORAL | 0 refills | Status: DC | PRN

## 2021-12-23 NOTE — Telephone Encounter (Signed)
Please call pt and let him know I did refill meds but he really needs to go for labs. Thank you ?

## 2021-12-24 NOTE — Telephone Encounter (Signed)
Left detailed message on patient's voice mail.

## 2022-01-14 ENCOUNTER — Other Ambulatory Visit: Payer: Self-pay | Admitting: Family Medicine

## 2022-02-14 ENCOUNTER — Other Ambulatory Visit: Payer: Self-pay | Admitting: Family Medicine

## 2022-02-22 ENCOUNTER — Telehealth: Payer: Self-pay | Admitting: Pulmonary Disease

## 2022-02-22 DIAGNOSIS — J4540 Moderate persistent asthma, uncomplicated: Secondary | ICD-10-CM

## 2022-02-22 DIAGNOSIS — D7218 Eosinophilia in diseases classified elsewhere: Secondary | ICD-10-CM

## 2022-02-22 DIAGNOSIS — M301 Polyarteritis with lung involvement [Churg-Strauss]: Secondary | ICD-10-CM

## 2022-02-24 NOTE — Telephone Encounter (Signed)
Refill sent for NUCALA to [facility]: [phone]  Dose: 100 mg SQ every 4 weeks  Last OV: 09/03/21 Provider: Dr. Judeth Horn  Next OV: not yet scheduled but should have been 6 months  Chesley Mires, PharmD, MPH, BCPS Clinical Pharmacist (Rheumatology and Pulmonology)

## 2022-02-24 NOTE — Telephone Encounter (Signed)
Left voicemail for patient to call back to schedule follow up and mailed letter as a reminder to address on file.

## 2022-03-12 ENCOUNTER — Encounter: Payer: Self-pay | Admitting: Pulmonary Disease

## 2022-03-12 ENCOUNTER — Ambulatory Visit (INDEPENDENT_AMBULATORY_CARE_PROVIDER_SITE_OTHER): Payer: BC Managed Care – PPO | Admitting: Pulmonary Disease

## 2022-03-12 VITALS — BP 144/76 | HR 69 | Ht 67.0 in | Wt 241.6 lb

## 2022-03-12 DIAGNOSIS — J309 Allergic rhinitis, unspecified: Secondary | ICD-10-CM | POA: Diagnosis not present

## 2022-03-12 MED ORDER — TRELEGY ELLIPTA 200-62.5-25 MCG/ACT IN AEPB
INHALATION_SPRAY | RESPIRATORY_TRACT | 6 refills | Status: DC
Start: 2022-03-12 — End: 2022-06-24

## 2022-03-12 NOTE — Patient Instructions (Signed)
Nice to see you again  I am glad the Nucala and inhalers are helping  I refilled the Trelegy today  I sent a referral to Dr. Lucie Leather with allergy to help further address the nasal issues  Return to clinic in 6 months or sooner as needed with Dr. Judeth Horn

## 2022-03-13 NOTE — Progress Notes (Signed)
@Patient  ID: , male    DOB: 1971-01-05, 51 y.o.   MRN: [id number]  Chief Complaint  Patient presents with   Follow-up    Pt is here for follow up for severe asthma. Pt states that his breathing is doing well. Pt wants to talk about allergy testing with MH> Pt is on Trelegy daily and Albuterol as needed. Pt is on Nucala shots and has had no issues noted so far.     Referring provider: [id number], *  HPI:   51 y.o. man with past medical history of asthma and EBPA treated with Nucala lost to follow-up at [facility] who is here for follow up for evaluation of asthma.  Most recent note from [facility], NP.  Overall, doing quite well.  Reports adherence to Nucala as well as his maintenance inhaler.  Occasional cough in the morning.  Overall cough is essentially well controlled.  Much better than prior to Nucala.  In the past, he was evaluated by allergy and immunology.  He has long-term issues with sinuses.  Including polyps.  Status post endoscopic sinus surgery.  This was a bad experience not interested in going through that again.  No allergies in the past and a lot of allergies on skin prick testing.  However cannot do daily allergy shots as was recommended.  He is requesting referral to allergist for further evaluation of nasal congestion.  Overall he is congestion much improved on Nucala but he does have residual symptoms.  HPI at initial visit: Patient with longstanding history of asthma.  Recurrent exacerbations.  Was placed on Nucala in the past.  This greatly reduced burden of symptoms.  Really was not using inhalers very much.  Was lost to follow-up at [facility].  Nucala last dose in 2021.  In the interim, he has developed worsening shortness of breath.  Worsening cough.  Wheeze.  Worse in the evenings.  Symptoms worse with inclines or stairs.  Worse when outside.  Worse with change of seasons.  No other relieving or exacerbating factors.  Most recent CT chest 02/06/2016  reviewed and interpreted as clear lungs bilaterally.  Patient has recurrent sinusitis.  Underwent sinus surgery August 2017.  No eosinophils.  Despite this he can have frequent exacerbations.  Need to start antibiotics frequently.  History with high-dose steroids and methotrexate in the past.  Methotrexate irritated his stomach.  This is back in 2018.  ANCA was positive at that time.  E GPA  was clinically diagnosed based on clinical criteria.  Unfortunately his was not very steroid responsive.  1 point time he was on prednisone 10 mg daily as well as Imuran 50 mg daily.  This got a large acute pulmonary notes.  Eventually he was transitioned to [facility] with better control of sinus as well as breathing issues.  Tried multiple different inhalers.  Currently not taking any other immunosuppressive medicines  PMH: Asthma, attention deficit disorder Surgical history: Esophageal dilation Family history: Hypertension in father, CVA Father Social history: Never smoker, lives in [place]  Questionaires / Pulmonary Flowsheets:   ACT:  Asthma Control Test ACT Total Score  03/12/2022  3:42 PM 52  09/03/2021  4:40 PM 19   MMRC:     No data to display          Epworth:      No data to display          Tests:   FENO:  No results found for: "NITRICOXIDE"  PFT:     No data to display          WALK:      No data to display          Imaging: Personally reviewed as per EMR discussion this note  Lab Results: Personally reviewed CBC    Component Value Date/Time   WBC 9.6 11/06/2020 0000   RBC 5.62 11/06/2020 0000   HGB 17.7 (H) 11/06/2020 0000   HCT 51.6 (H) 11/06/2020 0000   PLT 394 11/06/2020 0000   MCV 91.8 11/06/2020 0000   MCH 31.5 11/06/2020 0000   MCHC 34.3 11/06/2020 0000   RDW 12.1 11/06/2020 0000   LYMPHSABS 2,378 01/05/2017 1534   MONOABS 902 01/05/2017 1534   EOSABS 656 (H) 01/05/2017 1534   BASOSABS 82 01/05/2017 1534    BMET    Component Value  Date/Time   NA 138 11/06/2020 0000   K 4.6 11/06/2020 0000   CL 100 11/06/2020 0000   CO2 29 11/06/2020 0000   GLUCOSE 111 (H) 11/06/2020 0000   BUN 20 11/06/2020 0000   CREATININE 1.06 11/06/2020 0000   CALCIUM 9.6 11/06/2020 0000   GFRNONAA 81 11/06/2020 0000   GFRAA 94 11/06/2020 0000    BNP No results found for: "BNP"  ProBNP No results found for: "PROBNP"  Specialty Problems       Pulmonary Problems   Asthma, allergic   Chronic rhinitis     Allergies  Allergen Reactions   Penicillins Anaphylaxis and Swelling    REACTION: anaphalatic     Lisinopril Other (See Comments)    ED   Losartan Other (See Comments)    Dizziness and leg pain    Immunization History  Administered Date(s) Administered   Influenza,inj,Quad PF,6+ Mos 07/07/2017   Influenza-Unspecified 06/01/2018   Tdap 07/20/2018    Past Medical History:  Diagnosis Date   Anxiety    anger problems   Eosinophilic esophagitis    Fatty liver    History of methicillin resistant staphylococcus aureus (MRSA)    Hypertension    Obesity     Tobacco History: Social History   Tobacco Use  Smoking Status Never  Smokeless Tobacco Never   Counseling given: Not Answered   Continue to not smoke  Outpatient Encounter Medications as of 03/12/2022  Medication Sig   albuterol (VENTOLIN HFA) 108 (90 Base) MCG/ACT inhaler 2 puffs as needed daily   ALPRAZolam (XANAX) 0.5 MG tablet Take 1 tablet (0.5 mg total) by mouth at bedtime as needed.   amphetamine-dextroamphetamine (ADDERALL XR) 30 MG 24 hr capsule Take 1 capsule (30 mg total) by mouth every morning.   amphetamine-dextroamphetamine (ADDERALL XR) 30 MG 24 hr capsule Take 1 capsule (30 mg total) by mouth every morning.   amphetamine-dextroamphetamine (ADDERALL XR) 30 MG 24 hr capsule Take 1 capsule (30 mg total) by mouth daily.   EPINEPHrine 0.3 mg/0.3 mL IJ SOAJ injection Inject 0.3 mg into the muscle as needed for anaphylaxis.   Insulin Pen Needle  32G X 4 MM MISC Use to inject into the skin daily   Mepolizumab (NUCALA) 100 MG/ML SOAJ INJECT THE CONTENTS OF 1 PEN (100 MG) UNDER THE SKIN EVERY 4 WEEKS   metoprolol succinate (TOPROL-XL) 25 MG 24 hr tablet TAKE 1 TABLET BY MOUTH EVERY DAY   omeprazole (PRILOSEC) 40 MG capsule TAKE 1 CAPSULE BY MOUTH EVERY DAY   testosterone cypionate (DEPOTESTOSTERONE CYPIONATE) 200 MG/ML injection INJECT 1 ML (200 MG TOTAL) INTO THE MUSCLE EVERY  14 DAYS   WEGOVY 2.4 MG/0.75ML SOAJ INJECT 2.4 MG INTO THE SKIN EVERY 7 (SEVEN) DAYS.   [DISCONTINUED] TRELEGY ELLIPTA 200-62.5-25 MCG/ACT AEPB TAKE 1 PUFF BY MOUTH EVERY DAY   Fluticasone-Umeclidin-Vilant (TRELEGY ELLIPTA) 200-62.5-25 MCG/ACT AEPB TAKE 1 PUFF BY MOUTH EVERY DAY   No facility-administered encounter medications on file as of 03/12/2022.     Review of Systems  Review of Systems  N/a Physical Exam  BP (!) 144/76 (BP Location: Left Arm, Patient Position: Sitting, Cuff Size: Normal)   Pulse 69   Ht 5\' 7"  (1.702 m)   Wt 241 lb 9.6 oz (109.6 kg)   SpO2 98%   BMI 37.84 kg/m   Wt Readings from Last 5 Encounters:  03/12/22 241 lb 9.6 oz (109.6 kg)  12/19/21 241 lb (109.3 kg)  09/03/21 247 lb (112 kg)  05/16/21 243 lb (110.2 kg)  01/23/21 239 lb (108.4 kg)    BMI Readings from Last 5 Encounters:  03/12/22 37.84 kg/m  12/19/21 37.19 kg/m  09/03/21 38.11 kg/m  05/16/21 40.44 kg/m  01/23/21 39.77 kg/m     Physical Exam General: In exam chair, no acute distress Eyes: EOMI, icterus Neck: Supple, no JVP Pulmonary: Wheezing bilaterally, normal work of breathing Cardiovascular: Regular rate and rhythm, no murmur Abdomen: Nondistended, bowel sounds present MSK: No synovitis, no joint effusion Neuro: Normal gait, no weakness Psych: Normal mood, full affect.   Assessment & Plan:   Asthma: Present for many years.   Continue Trelegy for ICS/LABA/LAMA therapy due to symptom burden.  Resumed Nucala with improvement in symptoms.  Vast  improvement in symptoms.  Seasonal allergies: Referral to allergy and immunology today.  See if there are other options to help with nasal congestion, sinus issues.  He is not interested in surgical intervention as he is already been through this with ENT in the past.  Dyspnea on exertion: Suspect related to poorly controlled asthma.  See above.  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  ANCA positive in the past.  Significant sinus issues but improved overall.  Continue Nucala.  May need to reengage rheumatology in the future.   Return in about 6 months (around 09/12/2022).   09/14/2022, MD

## 2022-03-15 ENCOUNTER — Encounter: Payer: Self-pay | Admitting: Family Medicine

## 2022-03-29 ENCOUNTER — Other Ambulatory Visit: Payer: Self-pay | Admitting: Family Medicine

## 2022-04-01 NOTE — Telephone Encounter (Signed)
Pharmacy requesting PA

## 2022-04-04 ENCOUNTER — Encounter: Payer: Self-pay | Admitting: Family Medicine

## 2022-04-04 DIAGNOSIS — J4550 Severe persistent asthma, uncomplicated: Secondary | ICD-10-CM

## 2022-04-07 ENCOUNTER — Encounter: Payer: Self-pay | Admitting: Family Medicine

## 2022-04-07 MED ORDER — ALBUTEROL SULFATE HFA 108 (90 BASE) MCG/ACT IN AERS: INHALATION_SPRAY | RESPIRATORY_TRACT | 2 refills | Status: DC

## 2022-04-07 NOTE — Telephone Encounter (Signed)
Pharmacy needs PA from insurance company for [employer].  Christian Gregory, CMA

## 2022-04-22 ENCOUNTER — Ambulatory Visit (INDEPENDENT_AMBULATORY_CARE_PROVIDER_SITE_OTHER): Payer: BC Managed Care – PPO | Admitting: Family Medicine

## 2022-04-22 ENCOUNTER — Encounter: Payer: Self-pay | Admitting: Family Medicine

## 2022-04-22 VITALS — BP 155/88 | HR 73 | Ht 67.0 in | Wt 238.0 lb

## 2022-04-22 DIAGNOSIS — E099 Drug or chemical induced diabetes mellitus without complications: Secondary | ICD-10-CM | POA: Diagnosis not present

## 2022-04-22 DIAGNOSIS — F902 Attention-deficit hyperactivity disorder, combined type: Secondary | ICD-10-CM

## 2022-04-22 DIAGNOSIS — Z7689 Persons encountering health services in other specified circumstances: Secondary | ICD-10-CM

## 2022-04-22 DIAGNOSIS — I10 Essential (primary) hypertension: Secondary | ICD-10-CM | POA: Diagnosis not present

## 2022-04-22 DIAGNOSIS — E291 Testicular hypofunction: Secondary | ICD-10-CM

## 2022-04-22 DIAGNOSIS — T380X5D Adverse effect of glucocorticoids and synthetic analogues, subsequent encounter: Secondary | ICD-10-CM | POA: Diagnosis not present

## 2022-04-22 MED ORDER — AMPHETAMINE-DEXTROAMPHET ER 30 MG PO CP24: 30.0000 mg | ORAL_CAPSULE | Freq: Every day | ORAL | 0 refills | Status: DC

## 2022-04-22 MED ORDER — METOPROLOL SUCCINATE ER 50 MG PO TB24: 50.0000 mg | ORAL_TABLET | Freq: Every day | ORAL | 1 refills | Status: DC

## 2022-04-22 MED ORDER — AMPHETAMINE-DEXTROAMPHET ER 30 MG PO CP24: 30.0000 mg | ORAL_CAPSULE | ORAL | 0 refills | Status: DC

## 2022-04-22 NOTE — Assessment & Plan Note (Signed)
For updated labs and urine microalbumin.  He says he will try to come back in the morning and get that done.

## 2022-04-22 NOTE — Assessment & Plan Note (Signed)
Blood pressure not at goal today we will increase metoprolol to 50 mg.

## 2022-04-22 NOTE — Assessment & Plan Note (Addendum)
Starting weight: 6/21 - 255 lbs.   Current weight: 238 pounds Change in weight: 6.6% decrease.   We will try to call the insurance company to get the information updated to get his [facility] approved.

## 2022-04-22 NOTE — Patient Instructions (Signed)
Blood pressure has been running high the last couple times that you have been here some good to go ahead and increase her metoprolol to 50 mg.  New prescription sent to pharmacy.

## 2022-04-22 NOTE — Progress Notes (Signed)
Established Patient Office Visit  Subjective   Patient ID: Christian Gregory, male    DOB: 12-03-1970  Age: 51 y.o. MRN: [id number]  Chief Complaint  Patient presents with   Follow-up    HPI  ADHD - Reports symptoms are well controlled on current regime. Denies any problems with insomnia, chest pain, palpitations, or SOB.    Follow-up weight management-doing really well with Wegovy.  The weight loss medication was recently denied because states that they did not lose 4% of his weight but he did since the start of the medication he just had a long gap in between where it was denied for a while but then he got reapproval.  So from his original starting weight he has lost more than 4%.  Follow-up hypogonadism-missed out of testosterone 2 for repeat labs.    ROS    Objective:     BP (!) 155/88   Pulse 73   Ht 5\' 7"  (1.702 m)   Wt 238 lb (108 kg)   SpO2 99%   BMI 37.28 kg/m    Physical Exam Constitutional:      Appearance: He is well-developed.  HENT:     Head: Normocephalic and atraumatic.  Cardiovascular:     Rate and Rhythm: Normal rate and regular rhythm.     Heart sounds: Normal heart sounds.  Pulmonary:     Effort: Pulmonary effort is normal.     Breath sounds: Normal breath sounds.  Skin:    General: Skin is warm and dry.  Neurological:     Mental Status: He is alert and oriented to person, place, and time.  Psychiatric:        Behavior: Behavior normal.      No results found for any visits on 04/22/22.    The 10-year ASCVD risk score (Arnett DK, et al., 2019) is: 14.5%    Assessment & Plan:   Problem List Items Addressed This Visit       Cardiovascular and Mediastinum   ESSENTIAL HYPERTENSION, BENIGN - Primary    Blood pressure not at goal today we will increase metoprolol to 50 mg.      Relevant Medications   metoprolol succinate (TOPROL-XL) 50 MG 24 hr tablet   Other Relevant Orders   Lipid Panel w/reflex Direct LDL   COMPLETE METABOLIC  PANEL WITH GFR   CBC   PSA   Urine Microalbumin w/creat. ratio   Testosterone Total,Free,Bio, Males     Endocrine   Steroid-induced diabetes (HCC)    For updated labs and urine microalbumin.  He says he will try to come back in the morning and get that done.      Relevant Orders   Lipid Panel w/reflex Direct LDL   COMPLETE METABOLIC PANEL WITH GFR   CBC   PSA   Urine Microalbumin w/creat. ratio   Testosterone Total,Free,Bio, Males   Hypogonadism male   Relevant Orders   Lipid Panel w/reflex Direct LDL   COMPLETE METABOLIC PANEL WITH GFR   CBC   PSA   Urine Microalbumin w/creat. ratio   Testosterone Total,Free,Bio, Males     Other   Encounter for weight management    Starting weight: 6/21 - 255 lbs.   Current weight: 238 pounds Change in weight: 6.6% decrease.   We will try to call the insurance company to get the information updated to get his [facility] approved.      Attention deficit hyperactivity disorder (ADHD), combined type    Well  controlled. Continue current regimen. Follow up in  6 mo       Relevant Medications   amphetamine-dextroamphetamine (ADDERALL XR) 30 MG 24 hr capsule   amphetamine-dextroamphetamine (ADDERALL XR) 30 MG 24 hr capsule   amphetamine-dextroamphetamine (ADDERALL XR) 30 MG 24 hr capsule   Other Relevant Orders   Lipid Panel w/reflex Direct LDL   COMPLETE METABOLIC PANEL WITH GFR   CBC   PSA   Urine Microalbumin w/creat. ratio   Testosterone Total,Free,Bio, Males    Return in about 2 months (around 06/22/2022) for recheck BP .    Nani Gasser, MD

## 2022-04-22 NOTE — Assessment & Plan Note (Signed)
Well controlled. Continue current regimen. Follow up in  6 mo  

## 2022-04-23 LAB — COMPLETE METABOLIC PANEL WITH GFR
AG Ratio: 1.5 (calc) (ref 1.0–2.5)
ALT: 45 U/L (ref 9–46)
AST: 27 U/L (ref 10–35)
Albumin: 4.5 g/dL (ref 3.6–5.1)
Alkaline phosphatase (APISO): 53 U/L (ref 35–144)
BUN: 14 mg/dL (ref 7–25)
CO2: 22 mmol/L (ref 20–32)
Calcium: 9.3 mg/dL (ref 8.6–10.3)
Chloride: 103 mmol/L (ref 98–110)
Creat: 0.92 mg/dL (ref 0.70–1.30)
Globulin: 3.1 g/dL (calc) (ref 1.9–3.7)
Glucose, Bld: 124 mg/dL (ref 65–139)
Potassium: 4.1 mmol/L (ref 3.5–5.3)
Sodium: 136 mmol/L (ref 135–146)
Total Bilirubin: 0.6 mg/dL (ref 0.2–1.2)
Total Protein: 7.6 g/dL (ref 6.1–8.1)
eGFR: 100 mL/min/{1.73_m2} (ref >=60)

## 2022-04-23 LAB — CBC
HCT: 56.1 % — ABNORMAL HIGH (ref 38.5–50.0)
Hemoglobin: 19.1 g/dL — ABNORMAL HIGH (ref 13.2–17.1)
MCH: 31.4 pg (ref 27.0–33.0)
MCHC: 34 g/dL (ref 32.0–36.0)
MCV: 92.1 fL (ref 80.0–100.0)
MPV: 10.2 fL (ref 7.5–12.5)
Platelets: 324 10*3/uL (ref 140–400)
RBC: 6.09 10*6/uL — ABNORMAL HIGH (ref 4.20–5.80)
RDW: 11.8 % (ref 11.0–15.0)
WBC: 9.2 10*3/uL (ref 3.8–10.8)

## 2022-04-23 LAB — TESTOSTERONE TOTAL,FREE,BIO, MALES
Albumin: 4.5 g/dL (ref 3.6–5.1)
Sex Hormone Binding: 25 nmol/L (ref 10–50)
Testosterone: 82 ng/dL — ABNORMAL LOW (ref 250–827)

## 2022-04-23 LAB — LIPID PANEL W/REFLEX DIRECT LDL
Cholesterol: 201 mg/dL — ABNORMAL HIGH (ref <200)
HDL: 58 mg/dL (ref >=40)
LDL Cholesterol (Calc): 113 mg/dL (calc) — ABNORMAL HIGH
Non-HDL Cholesterol (Calc): 143 mg/dL (calc) — ABNORMAL HIGH (ref <130)
Total CHOL/HDL Ratio: 3.5 (calc) (ref <5.0)
Triglycerides: 186 mg/dL — ABNORMAL HIGH (ref <150)

## 2022-04-23 LAB — PSA: PSA: 0.77 ng/mL (ref <=4.00)

## 2022-04-23 NOTE — Progress Notes (Signed)
I Sarge, triglycerides up a little bit compared to last year just continue to work on healthy diet and regular exercise.  Optimally LDL should be less than 100, currently at 113.  Globin is very high.  Normally run around 17 which is definitely on the higher end of normal but this time it was 19 so I do want to recheck that in 1 to 2 weeks to make sure that it is accurate.  Testosterone levels are low when was your last injection?  Prostate test and metabolic panel are normal.

## 2022-04-24 ENCOUNTER — Other Ambulatory Visit: Payer: Self-pay | Admitting: Family Medicine

## 2022-04-24 ENCOUNTER — Telehealth: Payer: Self-pay

## 2022-04-24 DIAGNOSIS — E291 Testicular hypofunction: Secondary | ICD-10-CM

## 2022-04-24 MED ORDER — TESTOSTERONE CYPIONATE 200 MG/ML IM SOLN: INTRAMUSCULAR | 1 refills | Status: DC

## 2022-04-24 NOTE — Telephone Encounter (Addendum)
Initiated Prior authorization TDV:VOHYWV 2.4MG /0.75ML auto-injectors Via: Covermymeds Case/Key:[id number] Status: approved  as of 04/24/22 Reason:Effective from 04/24/2022 through 04/23/2023 Notified Pt via: Mychart

## 2022-04-24 NOTE — Progress Notes (Signed)
Meds ordered this encounter  Medications   testosterone cypionate (DEPOTESTOSTERONE CYPIONATE) 200 MG/ML injection    Sig: INJECT 1 ML (200 MG TOTAL) INTO THE MUSCLE EVERY 14 DAYS    Dispense:  6 mL    Refill:  1    Not to exceed 4 additional fills before 02/23/2022

## 2022-04-25 ENCOUNTER — Other Ambulatory Visit: Payer: Self-pay

## 2022-04-25 DIAGNOSIS — E099 Drug or chemical induced diabetes mellitus without complications: Secondary | ICD-10-CM | POA: Diagnosis not present

## 2022-04-25 DIAGNOSIS — T380X5D Adverse effect of glucocorticoids and synthetic analogues, subsequent encounter: Secondary | ICD-10-CM | POA: Diagnosis not present

## 2022-04-25 NOTE — Progress Notes (Signed)
Ordered labs per office note.  

## 2022-04-26 LAB — HEMOGLOBIN A1C
Hgb A1c MFr Bld: 5.9 % of total Hgb — ABNORMAL HIGH (ref <5.7)
Mean Plasma Glucose: 123 mg/dL
eAG (mmol/L): 6.8 mmol/L

## 2022-04-28 NOTE — Progress Notes (Signed)
A1C is  5.9 looks great.  Continue current regimen.

## 2022-05-20 ENCOUNTER — Ambulatory Visit (INDEPENDENT_AMBULATORY_CARE_PROVIDER_SITE_OTHER): Payer: BC Managed Care – PPO | Admitting: Allergy and Immunology

## 2022-05-20 VITALS — BP 158/98 | HR 107 | Temp 98.5°F | Resp 16 | Ht 66.93 in | Wt 244.0 lb

## 2022-05-20 DIAGNOSIS — M301 Polyarteritis with lung involvement [Churg-Strauss]: Secondary | ICD-10-CM | POA: Diagnosis not present

## 2022-05-20 DIAGNOSIS — D751 Secondary polycythemia: Secondary | ICD-10-CM

## 2022-05-20 DIAGNOSIS — J3089 Other allergic rhinitis: Secondary | ICD-10-CM | POA: Diagnosis not present

## 2022-05-20 DIAGNOSIS — H1013 Acute atopic conjunctivitis, bilateral: Secondary | ICD-10-CM

## 2022-05-20 DIAGNOSIS — J4550 Severe persistent asthma, uncomplicated: Secondary | ICD-10-CM

## 2022-05-20 DIAGNOSIS — J301 Allergic rhinitis due to pollen: Secondary | ICD-10-CM

## 2022-05-20 DIAGNOSIS — D7218 Eosinophilia in diseases classified elsewhere: Secondary | ICD-10-CM

## 2022-05-20 MED ORDER — RYALTRIS 665-25 MCG/ACT NA SUSP: 2.0000 | Freq: Two times a day (BID) | NASAL | 5 refills | Status: DC

## 2022-05-20 MED ORDER — ALBUTEROL SULFATE HFA 108 (90 BASE) MCG/ACT IN AERS: 2.0000 | INHALATION_SPRAY | RESPIRATORY_TRACT | 1 refills | Status: DC | PRN

## 2022-05-20 MED ORDER — OLOPATADINE HCL 0.2 % OP SOLN: 1.0000 [drp] | OPHTHALMIC | 5 refills | Status: DC

## 2022-05-20 MED ORDER — IPRATROPIUM BROMIDE 0.06 % NA SOLN: 2.0000 | Freq: Four times a day (QID) | NASAL | 5 refills | Status: DC

## 2022-05-20 MED ORDER — CARBINOXAMINE MALEATE 4 MG PO TABS: 2.0000 | ORAL_TABLET | Freq: Two times a day (BID) | ORAL | 5 refills | Status: DC | PRN

## 2022-05-20 NOTE — Patient Instructions (Addendum)
  1.  Allergen avoidance measures  2.  Treat and prevent inflammation of airway:   A.  Mepolizumab injections  B.  Trelegy 200 -1 inhalation 1 time per day  C.  Ryaltris -2 sprays each nostril twice a day (SP)  3. If needed:   A.  Albuterol HFA -2 inhalations every 4-6 hours  B.  Pataday -1 drop each eye 1 time per day  C.  Carbinoxamine 4mg  - 1-2 tablets 1-2 times per day  D.  Ipratropium 0.06% -2 sprays each nostril every 6 hours to dry nose  4.  Consider a course of immunotherapy  5.  Obtain fall flu vaccine  6.  Blood -JAK2 mutation with reflex for polycythemia, CBC w/d, ANCA w/R  7.  Return to clinic in 4 weeks or earlier if problem

## 2022-05-20 NOTE — Progress Notes (Unsigned)
[place] - [place] - [place] - [place]   Dear [employer],  Thank you for referring Christian Gregory to the [facility] on 05/20/2022.   Below is a summation of this patient's evaluation and recommendations.  Thank you for your referral. I will keep you informed about this patient's response to treatment.   If you have any questions please do not hesitate to contact me.   Sincerely,  Jiles Prows, MD Allergy / Immunology [place]   ______________________________________________________________________    NEW PATIENT NOTE  Referring Provider: Lanier Clam, MD Primary Provider: Hali Marry, MD Date of office visit: 05/20/2022    Subjective:   Chief Complaint:  Christian Gregory (DOB: 10-22-1970) is a 51 y.o. male who presents to the clinic on 05/20/2022 with a chief complaint of Sinus Problem (For the past seven or years he has issues with smelling and taste. Has had several rounds of prednisone. Swelling in the nasal cavity and nasal drainage. ) and Allergic Rhinitis  (Itchy, watery eyes and runny nose. ) .     HPI: [name] presents to this clinic in evaluation of persistent eye and respiratory problems.  He has a long history of developing with asthma and congestion of his head and sneezing and itchy red watery eyes and mucus coming out of his nose and clear rhinorrhea dripping out of his nose if he bends over.  Apparently he was treated with methotrexate and prednisone in the past for what was clinically diagnosed as EGPA with positive ANCA, and has now settled on new Calla and trilogy.  At this point he thinks his lower airway disease is under excellent control with a requirement for short acting bronchodilator averaging out to about twice a month.  And it is really his eye issue and his upper airway issue that bothers him the most.  He does not have any  anosmia.  He does not snore or have apneic issues.  Apparently he has been skin tested in the past and found to be allergic to everything.  Past Medical History:  Diagnosis Date   Anxiety    anger problems   Eosinophilic esophagitis    Fatty liver    History of methicillin resistant staphylococcus aureus (MRSA)    Hypertension    Obesity     Past Surgical History:  Procedure Laterality Date   ESOPHAGEAL DILATION      Allergies as of 05/20/2022       Reactions   Penicillins Anaphylaxis, Swelling   REACTION: anaphalatic   Losartan Other (See Comments)   Dizziness and leg pain   Lisinopril Diarrhea   ED        Medication List    albuterol 108 (90 Base) MCG/ACT inhaler Commonly known as: VENTOLIN HFA 2 puffs as needed daily   ALPRAZolam 0.5 MG tablet Commonly known as: XANAX Take 1 tablet (0.5 mg total) by mouth at bedtime as needed.   amphetamine-dextroamphetamine 30 MG 24 hr capsule Commonly known as: ADDERALL XR Take 1 capsule (30 mg total) by mouth every morning.   EPINEPHrine 0.3 mg/0.3 mL Soaj injection Commonly known as: EPI-PEN Inject 0.3 mg into the muscle as needed for anaphylaxis.   Insulin Pen Needle 32G X 4 MM Misc Use to inject into the skin daily   metoprolol succinate 50 MG 24 hr tablet Commonly known as: TOPROL-XL Take 1 tablet (50 mg total)  by mouth daily.   Nucala 100 MG/ML Soaj Generic drug: Mepolizumab INJECT THE CONTENTS OF 1 PEN (100 MG) UNDER THE SKIN EVERY 4 WEEKS   omeprazole 40 MG capsule Commonly known as: PRILOSEC TAKE 1 CAPSULE BY MOUTH EVERY DAY   testosterone cypionate 200 MG/ML injection Commonly known as: DEPOTESTOSTERONE CYPIONATE INJECT 1 ML (200 MG TOTAL) INTO THE MUSCLE EVERY 14 DAYS   Trelegy Ellipta 200-62.5-25 MCG/ACT Aepb Generic drug: Fluticasone-Umeclidin-Vilant TAKE 1 PUFF BY MOUTH EVERY DAY    Review of systems negative except as noted in HPI / PMHx or noted below:  Review of Systems   Constitutional: Negative.   HENT: Negative.    Eyes: Negative.   Respiratory: Negative.    Cardiovascular: Negative.   Gastrointestinal: Negative.   Genitourinary: Negative.   Musculoskeletal: Negative.   Skin: Negative.   Neurological: Negative.   Endo/Heme/Allergies: Negative.   Psychiatric/Behavioral: Negative.      Family History  Problem Relation Age of Onset   Hypertension Mother        father   Stroke Father     Social History   Socioeconomic History   Marital status: Married    Spouse name: Not on file   Number of children: Not on file   Years of education: Not on file   Highest education level: Not on file  Occupational History   Not on file  Tobacco Use   Smoking status: Never   Smokeless tobacco: Never  Substance and Sexual Activity   Alcohol use: Yes    Comment: 6 pack of beer 2-3 x a week   Drug use: No   Sexual activity: Yes  Other Topics Concern   Not on file  Social History Narrative   Not on file   Environmental and Social history  Lives in a house with a dry environment, a dog located inside the household, no carpet in the bedroom, plastic on the bed, no plastic on the pillow, and no smoking ongoing with inside the household.  He works as an Radiation protection practitioner.  Objective:   Vitals:   05/20/22 1422  BP: (!) 158/98  Pulse: (!) 107  Resp: 16  Temp: 98.5 F (36.9 C)  SpO2: 97%   Height: 5' 6.93" (170 cm) Weight: 244 lb (110.7 kg)  Physical Exam Constitutional:      Appearance: He is not diaphoretic.  HENT:     Head: Normocephalic.     Right Ear: Tympanic membrane, ear canal and external ear normal.     Left Ear: Tympanic membrane, ear canal and external ear normal.     Nose: Nose normal. No mucosal edema or rhinorrhea.     Mouth/Throat:     Pharynx: Uvula midline. No oropharyngeal exudate.  Eyes:     Conjunctiva/sclera: Conjunctivae normal.  Neck:     Thyroid: No thyromegaly.     Trachea: Trachea  normal. No tracheal tenderness or tracheal deviation.  Cardiovascular:     Rate and Rhythm: Normal rate and regular rhythm.     Heart sounds: Normal heart sounds, S1 normal and S2 normal. No murmur heard. Pulmonary:     Effort: No respiratory distress.     Breath sounds: Normal breath sounds. No stridor. No wheezing or rales.  Lymphadenopathy:     Head:     Right side of head: No tonsillar adenopathy.     Left side of head: No tonsillar adenopathy.     Cervical: No cervical adenopathy.  Skin:  Findings: No erythema or rash.     Nails: There is no clubbing.  Neurological:     Mental Status: He is alert.     Diagnostics: Allergy skin tests were performed.   Spirometry was performed and demonstrated an FEV1 of 2.66 @ 78 % of predicted. FEV1/FVC = 0.86  The patient had an Asthma Control Test with the following results:  .    Results of blood tests obtained 22 April 2022 identified WBC 9.2, hemoglobin 19.1, platelet 324, creatinine 0.92 MGs/DL, AST 27 U/L, ALT 45 U/L,  Results of a sinus CT scan obtained 21 November 2015 identified the following:  There is complete opacification of the frontal and ethmoid air cells with considerable mucosal edema and partial opacification of the sphenoid and maxillary sinus is consistent with severe pan sinusitis. Coronal images were not able to be performed on the current CT scanner, but there does appear to be mucosal edema involving the olfactory recesses which would hinder the ability to smell. The nasal airway appears patent and the turbinates appear grossly normal in position. No bony abnormality is seen.   Assessment and Plan:    1. Asthma, severe persistent, well-controlled   2. Eosinophilic granulomatosis with polyangiitis (EGPA) ([place])   3. Polycythemia   4. Perennial allergic rhinitis   5. Seasonal allergic rhinitis due to pollen   6. Perennial allergic conjunctivitis of both eyes     Patient Instructions   1.  Allergen  avoidance measures  2.  Treat and prevent inflammation of airway:   A.  Mepolizumab injections  B.  Trelegy 200 -1 inhalation 1 time per day  C.  Ryaltris -2 sprays each nostril twice a day (SP)  3. If needed:   A.  Albuterol HFA -2 inhalations every 4-6 hours  B.  Pataday -1 drop each eye 1 time per day  C.  Carbinoxamine 4mg  - 1-2 tablets 1-2 times per day  D.  Ipratropium 0.06% -2 sprays each nostril every 6 hours to dry nose  4.  Consider a course of immunotherapy  5.  Obtain fall flu vaccine  6.  Blood -JAK2 mutation with reflex for polycythemia, CBC w/d, ANCA w/R  7.  Return to clinic in 4 weeks or earlier if problem   Jiles Prows, MD Allergy / Immunology [place] [facility] [place]

## 2022-05-21 ENCOUNTER — Encounter: Payer: Self-pay | Admitting: Allergy and Immunology

## 2022-05-22 ENCOUNTER — Other Ambulatory Visit: Payer: Self-pay | Admitting: Family Medicine

## 2022-05-23 ENCOUNTER — Other Ambulatory Visit: Payer: Self-pay | Admitting: Pulmonary Disease

## 2022-05-23 DIAGNOSIS — M301 Polyarteritis with lung involvement [Churg-Strauss]: Secondary | ICD-10-CM

## 2022-05-23 DIAGNOSIS — J4540 Moderate persistent asthma, uncomplicated: Secondary | ICD-10-CM

## 2022-05-23 NOTE — Telephone Encounter (Signed)
Refill sent for NUCALA to [facility]: [phone]  Dose: 100 mg SQ every 4 weeks  Last OV: 03/12/22 Provider: Dr.Hunsucker  Next OV: 6 months (not yet scheduled)  Knox Saliva, PharmD, MPH, BCPS Clinical Pharmacist (Rheumatology and Pulmonology)

## 2022-05-26 LAB — ANCA PROFILE (RDL)
ANCA by IFA (RDL): NEGATIVE
Anti-MPO Ab (RDL): <20 Units (ref <20)
Anti-PR-3 Ab (RDL): <20 Units (ref <20)

## 2022-05-27 LAB — JAK2 V617F, REFLEX TO E12-15

## 2022-05-27 LAB — CBC WITH DIFFERENTIAL
Basophils Absolute: 0.1 10*3/uL (ref 0.0–0.2)
Basos: 1 %
EOS (ABSOLUTE): 0.1 10*3/uL (ref 0.0–0.4)
Eos: 1 %
Hematocrit: 55.8 % — ABNORMAL HIGH (ref 37.5–51.0)
Hemoglobin: 19.1 g/dL — ABNORMAL HIGH (ref 13.0–17.7)
Immature Grans (Abs): 0 10*3/uL (ref 0.0–0.1)
Immature Granulocytes: 0 %
Lymphocytes Absolute: 2.5 10*3/uL (ref 0.7–3.1)
Lymphs: 28 %
MCH: 31.2 pg (ref 26.6–33.0)
MCHC: 34.2 g/dL (ref 31.5–35.7)
MCV: 91 fL (ref 79–97)
Monocytes Absolute: 0.7 10*3/uL (ref 0.1–0.9)
Monocytes: 8 %
Neutrophils Absolute: 5.6 10*3/uL (ref 1.4–7.0)
Neutrophils: 62 %
RBC: 6.13 x10E6/uL — ABNORMAL HIGH (ref 4.14–5.80)
RDW: 11.9 % (ref 11.6–15.4)
WBC: 9 10*3/uL (ref 3.4–10.8)

## 2022-05-27 LAB — E12-E15

## 2022-06-18 ENCOUNTER — Ambulatory Visit: Payer: BC Managed Care – PPO | Admitting: Family Medicine

## 2022-06-24 ENCOUNTER — Ambulatory Visit (INDEPENDENT_AMBULATORY_CARE_PROVIDER_SITE_OTHER): Payer: BC Managed Care – PPO | Admitting: Family Medicine

## 2022-06-24 ENCOUNTER — Ambulatory Visit: Payer: BC Managed Care – PPO | Admitting: Family Medicine

## 2022-06-24 ENCOUNTER — Encounter: Payer: Self-pay | Admitting: Family Medicine

## 2022-06-24 VITALS — BP 140/100 | HR 96 | Temp 98.0°F | Resp 15

## 2022-06-24 DIAGNOSIS — D751 Secondary polycythemia: Secondary | ICD-10-CM

## 2022-06-24 DIAGNOSIS — H1013 Acute atopic conjunctivitis, bilateral: Secondary | ICD-10-CM | POA: Diagnosis not present

## 2022-06-24 DIAGNOSIS — J3089 Other allergic rhinitis: Secondary | ICD-10-CM

## 2022-06-24 DIAGNOSIS — D7218 Eosinophilia in diseases classified elsewhere: Secondary | ICD-10-CM

## 2022-06-24 DIAGNOSIS — H1010 Acute atopic conjunctivitis, unspecified eye: Secondary | ICD-10-CM

## 2022-06-24 DIAGNOSIS — M301 Polyarteritis with lung involvement [Churg-Strauss]: Secondary | ICD-10-CM

## 2022-06-24 DIAGNOSIS — J302 Other seasonal allergic rhinitis: Secondary | ICD-10-CM

## 2022-06-24 DIAGNOSIS — J4550 Severe persistent asthma, uncomplicated: Secondary | ICD-10-CM

## 2022-06-24 MED ORDER — TRELEGY ELLIPTA 200-62.5-25 MCG/ACT IN AEPB: INHALATION_SPRAY | RESPIRATORY_TRACT | 1 refills | Status: DC

## 2022-06-24 NOTE — Patient Instructions (Addendum)
  1.  Allergen avoidance measures -dust mite, cat, pollens, molds.  2.  Treat and prevent inflammation of airway:   A.  Mepolizumab injections  B.  Trelegy 200 -1 inhalation 1 time per day  C.  Ryaltris -2 sprays each nostril twice a day (SP)  3. If needed:   A.  Albuterol HFA -2 inhalations every 4-6 hours  B.  Pataday -1 drop each eye 1 time per day  C.  Carbinoxamine 4mg  - 1-2 tablets 1-2 times per day  D.  Ipratropium 0.06% -2 sprays each nostril every 6 hours to dry nose  4.  Consider a course of immunotherapy call the clinic if you are interested in beginning allergen immunotherapy  5.  Obtain fall flu vaccine  6.  Return to the clinic in 3 months or sooner if needed

## 2022-06-24 NOTE — Progress Notes (Signed)
[address] [place] [address] Dept: [phone]  FOLLOW UP NOTE  Patient ID: Christian Gregory, male    DOB: 1970-10-09  Age: 51 y.o. MRN: [id number] Date of Office Visit: 06/24/2022  Assessment  Chief Complaint: Follow-up  HPI Christian Gregory is a 51 year old male who presents to the clinic for a follow up visit. He was last seen in this clinic on 05/20/2022 by Dr. Lucie Leather for evaluation of asthma, EGPA, allergic rhinitis, allergic conjunctivitis, and polycythemia.  At today's consult, he reports his asthma has been moderately well controlled with occasional shortness of breath with activity including climbing stairs, occasional wheeze with activity, and no cough.  He continues Trelegy 200-1 puff once a day and uses albuterol about 2 days a week.  He continues in Nucala injections once every 28 days with [facility] pulmonology specialists.  Allergic rhinitis is reported as moderately well controlled with symptoms including nasal congestion occurring in the morning, light yellow to clear nasal drainage, sneezing, and frequent postnasal drainage.  He continues Ryaltris 2 sprays in each nostril twice a day and is not currently using nasal saline rinses.  Allergic conjunctivitis is reported as moderately well controlled with occasional red and itchy eyes for which he uses olopatadine with relief of symptoms.  His last environmental allergy testing was on 05/20/2022 and was positive to grass pollen, weed pollen, tree pollen, mold, dust mite, cat, and dog.  He is interested in starting allergen immunotherapy at this time.  At his last visit, investigation into polycythemia with testing for JAK2 mutation was negative. He stopped using testosterone about 3 weeks ago and agrees continue to remain away from testosterone and obtain a CBC at week 8 to recheck hemoglobin levels. His current medications are listed in the chart.   Drug Allergies:  Allergies  Allergen Reactions   Penicillins Anaphylaxis and Swelling     REACTION: anaphalatic     Losartan Other (See Comments)    Dizziness and leg pain   Lisinopril Diarrhea    ED    Physical Exam: BP (!) 140/100 (BP Location: Left Arm, Patient Position: Sitting, Cuff Size: Large)   Pulse 96   Temp 98 F (36.7 C) (Temporal)   Resp 15   SpO2 98%    Physical Exam Vitals reviewed.  Constitutional:      Appearance: Normal appearance.  HENT:     Head: Normocephalic and atraumatic.     Right Ear: Tympanic membrane normal.     Left Ear: Tympanic membrane normal.     Nose:     Comments: Bilateral nares slightly erythematous with clear thick nasal drainage.  Pharynx normal.  Ears normal.  Eyes normal.    Mouth/Throat:     Pharynx: Oropharynx is clear.  Eyes:     Conjunctiva/sclera: Conjunctivae normal.  Cardiovascular:     Rate and Rhythm: Normal rate and regular rhythm.     Heart sounds: Normal heart sounds. No murmur heard. Pulmonary:     Effort: Pulmonary effort is normal.     Breath sounds: Normal breath sounds.     Comments: Lungs clear to auscultation Musculoskeletal:        General: Normal range of motion.     Cervical back: Normal range of motion and neck supple.  Skin:    General: Skin is warm and dry.  Neurological:     Mental Status: He is alert and oriented to person, place, and time.  Psychiatric:  Mood and Affect: Mood normal.        Behavior: Behavior normal.        Thought Content: Thought content normal.        Judgment: Judgment normal.     Assessment and Plan: 1. Asthma, severe persistent, well-controlled   2. Seasonal and perennial allergic rhinitis   3. Seasonal allergic conjunctivitis   4. Polycythemia   5. Eosinophilic granulomatosis with polyangiitis (EGPA) ([facility])     Meds ordered this encounter  Medications   TRELEGY ELLIPTA 200-62.5-25 MCG/ACT AEPB    Sig: TAKE 1 PUFF BY MOUTH EVERY DAY    Dispense:  180 each    Refill:  1    Patient Instructions   1.  Allergen avoidance measures -dust  mite, cat, pollens, molds.  2.  Treat and prevent inflammation of airway:   A.  Mepolizumab injections  B.  Trelegy 200 -1 inhalation 1 time per day  C.  Ryaltris -2 sprays each nostril twice a day (SP)  3. If needed:   A.  Albuterol HFA -2 inhalations every 4-6 hours  B.  Pataday -1 drop each eye 1 time per day  C.  Carbinoxamine 4mg  - 1-2 tablets 1-2 times per day  D.  Ipratropium 0.06% -2 sprays each nostril every 6 hours to dry nose  4.  Begin allergen immunotherapy. Make an appointment for your first injection for about 3 weeks from now  5.  Return to the clinic for labs on or around July 30, 2022  6.  Return to the clinic in 3 months or sooner if needed  Return in about 3 months (around 09/24/2022), or if symptoms worsen or fail to improve.    Thank you for the opportunity to care for this patient.  Please do not hesitate to contact me with questions.  Gareth Morgan, FNP Allergy and [place] [facility] [place]

## 2022-06-25 ENCOUNTER — Telehealth: Payer: Self-pay

## 2022-06-25 ENCOUNTER — Other Ambulatory Visit (HOSPITAL_COMMUNITY): Payer: Self-pay

## 2022-06-25 MED ORDER — EPINEPHRINE 0.3 MG/0.3ML IJ SOAJ: 0.3000 mg | Freq: Once | INTRAMUSCULAR | 2 refills | Status: AC

## 2022-06-25 NOTE — Telephone Encounter (Signed)
Patient Advocate Encounter   Received notification from [facility] that prior authorization is required for EPINEPHrine 0.3MG /0.3ML auto-injectors  Upon running a test claim, no PA is required. No PA submitted at this time.

## 2022-06-26 ENCOUNTER — Other Ambulatory Visit: Payer: Self-pay | Admitting: Allergy and Immunology

## 2022-06-26 DIAGNOSIS — J301 Allergic rhinitis due to pollen: Secondary | ICD-10-CM

## 2022-06-26 DIAGNOSIS — J4550 Severe persistent asthma, uncomplicated: Secondary | ICD-10-CM

## 2022-06-26 DIAGNOSIS — J3089 Other allergic rhinitis: Secondary | ICD-10-CM

## 2022-06-26 DIAGNOSIS — H1013 Acute atopic conjunctivitis, bilateral: Secondary | ICD-10-CM

## 2022-06-26 NOTE — Progress Notes (Signed)
Aeroallergen Immunotherapy   Ordering Provider: Dr. Laurette Schimke   Patient Details  Name: Christian Gregory  MRN: [id number]  Date of Birth: 1971-08-14   Order two of two   Vial Label: tree, grass, cat   0.3 ml (Volume)  BAU Concentration -- 7 Grass Mix* 100,000 ([address] [place], [place], [place], [place], [place], [place], [place])  0.1 ml (Volume)  1:20 Concentration -- [place]  0.1 ml (Volume)  BAU Concentration -- [place] 10,000  0.1 ml (Volume)  1:20 Concentration -- [place]  0.3 ml (Volume)  1:10 Concentration -- [place], [place] mix*  0.3 ml (Volume)  1:10 Concentration -- Cat Hair    1.2  ml Extract Subtotal  3.8  ml Diluent  5.0  ml Maintenance Total   Blue Vial (1:100,000): Schedule B (6 doses)  Yellow Vial (1:10,000): Schedule B (6 doses)  Green Vial (1:1,000): Schedule B (6 doses)  Red Vial (1:100): Schedule A (12 doses)   Special Instructions: 1-2 times per week

## 2022-06-26 NOTE — Progress Notes (Signed)
VIALS EXP 06-27-23

## 2022-06-26 NOTE — Progress Notes (Signed)
Aeroallergen Immunotherapy   Ordering Provider: Dr. Laurette Schimke   Patient Details  Name: Christian Gregory  MRN: [id number]  Date of Birth: 1971/02/16   Order one of two   Vial Label: dust mite, weed   0.3 ml (Volume)  1:20 Concentration -- Ragweed Mix  0.5 ml (Volume)  1:20 Concentration -- Weed Mix*  0.5 ml (Volume)   AU Concentration -- Mite Mix (DF 5,000 & DP 5,000)    1.3  ml Extract Subtotal  3.7  ml Diluent  5.0  ml Maintenance Total   Blue Vial (1:100,000): Schedule B (6 doses)  Yellow Vial (1:10,000): Schedule B (6 doses)  Green Vial (1:1,000): Schedule B (6 doses)  Red Vial (1:100): Schedule A (12 doses)   Special Instructions: 1-2 times per week

## 2022-06-27 DIAGNOSIS — J3081 Allergic rhinitis due to animal (cat) (dog) hair and dander: Secondary | ICD-10-CM | POA: Diagnosis not present

## 2022-07-02 ENCOUNTER — Encounter: Payer: Self-pay | Admitting: Family Medicine

## 2022-07-13 ENCOUNTER — Other Ambulatory Visit: Payer: Self-pay | Admitting: Family Medicine

## 2022-07-13 DIAGNOSIS — F902 Attention-deficit hyperactivity disorder, combined type: Secondary | ICD-10-CM

## 2022-07-14 MED ORDER — AMPHETAMINE-DEXTROAMPHET ER 30 MG PO CP24: 30.0000 mg | ORAL_CAPSULE | ORAL | 0 refills | Status: DC

## 2022-07-14 NOTE — Telephone Encounter (Signed)
Last OV: 04/22/22 Next OV: no appt scheduled Last RF: 04/22/22

## 2022-07-17 ENCOUNTER — Ambulatory Visit: Payer: BC Managed Care – PPO | Admitting: Family Medicine

## 2022-07-18 ENCOUNTER — Other Ambulatory Visit (HOSPITAL_COMMUNITY): Payer: Self-pay

## 2022-07-22 ENCOUNTER — Ambulatory Visit (INDEPENDENT_AMBULATORY_CARE_PROVIDER_SITE_OTHER): Payer: BC Managed Care – PPO

## 2022-07-22 DIAGNOSIS — J309 Allergic rhinitis, unspecified: Secondary | ICD-10-CM | POA: Diagnosis not present

## 2022-07-22 DIAGNOSIS — D751 Secondary polycythemia: Secondary | ICD-10-CM | POA: Diagnosis not present

## 2022-07-22 NOTE — Progress Notes (Signed)
Immunotherapy   Patient Details  Name: Christian Gregory MRN: [id number] Date of Birth: January 24, 1971  07/22/2022  Christian Gregory started injections for  Tree-Grass-Cat and [name]. Patient received 0.05 of both his blue vials with an expiration of 06/27/2023. Patient waited 30 minutes with no problems.  Following schedule: B  Frequency:2 times per week Epi-Pen:Epi-Pen Available  Consent signed and patient instructions given.   Dub Mikes 07/22/2022, 3:05 PM

## 2022-07-24 ENCOUNTER — Other Ambulatory Visit: Payer: Self-pay

## 2022-07-24 ENCOUNTER — Encounter: Payer: Self-pay | Admitting: Family Medicine

## 2022-07-24 ENCOUNTER — Ambulatory Visit (INDEPENDENT_AMBULATORY_CARE_PROVIDER_SITE_OTHER): Payer: BC Managed Care – PPO | Admitting: Family Medicine

## 2022-07-24 VITALS — BP 141/93 | HR 107 | Temp 98.4°F | Ht 67.0 in | Wt 234.1 lb

## 2022-07-24 DIAGNOSIS — J029 Acute pharyngitis, unspecified: Secondary | ICD-10-CM | POA: Diagnosis not present

## 2022-07-24 LAB — POCT INFLUENZA A/B
Influenza A, POC: NEGATIVE
Influenza B, POC: NEGATIVE

## 2022-07-24 MED ORDER — AZITHROMYCIN 250 MG PO TABS: ORAL_TABLET | ORAL | 0 refills | Status: DC

## 2022-07-24 MED ORDER — MAGIC MOUTHWASH W/LIDOCAINE: 10.0000 mL | Freq: Four times a day (QID) | ORAL | 0 refills | Status: DC

## 2022-07-24 NOTE — Progress Notes (Signed)
Acute Office Visit  Subjective:     Patient ID: Christian Gregory, male    DOB: 1971-06-25, 51 y.o.   MRN: [id number]  Chief Complaint  Patient presents with   acute pharyngitis    Patient c/o  ST ( worse today ) , fever (101.7)and episode of delirium ( yesterday) since Tuesday 07/22/22. Patient has taken ibuprofen at 7 am this morning.     HPI Patient is in today for acute visit.  Pt reports since Tuesday he's had fever and sore throat. Little cough with phlegm. No sick contacts. No ear pain. No nasal congestion. He's had some SOB when exertion. No chest pains. He reports his temp via forehead thermometer was 101.7. He has tried Ibuprofen and fluids along with nyquil for his symptoms. He reports he was in [place] last week but no one was sick. Pt allergic to [facility] with anaphylactic reaction. Pt also reports his throat hurts so bad it's hard to swallow his own spit.  Past Medical History:  Diagnosis Date   Anxiety    anger problems   Eosinophilic esophagitis    Fatty liver    History of methicillin resistant staphylococcus aureus (MRSA)    Hypertension    Obesity     Review of Systems  Constitutional:  Positive for fever and malaise/fatigue.  HENT:  Positive for ear pain and sore throat.   Eyes:  Negative for blurred vision and pain.  Respiratory:  Positive for cough, sputum production and shortness of breath. Negative for wheezing.   Cardiovascular:  Negative for chest pain and palpitations.  Genitourinary:  Negative for dysuria and urgency.  Neurological:  Negative for dizziness and headaches.        Objective:    BP (!) 141/93   Pulse (!) 107   Temp 98.4 F (36.9 C)   Ht 5\' 7"  (1.702 m)   Wt 234 lb 1 oz (106.2 kg)   SpO2 95%   BMI 36.66 kg/m    Physical Exam Vitals and nursing note reviewed.  Constitutional:      Appearance: Normal appearance. He is obese.  HENT:     Head: Normocephalic and atraumatic.     Right Ear: Tympanic membrane, ear canal and external ear  normal.     Left Ear: Tympanic membrane, ear canal and external ear normal.     Ears:     Comments: Effusion bilaterally L>R    Nose: Nose normal.     Mouth/Throat:     Mouth: Mucous membranes are moist.     Pharynx: Oropharynx is clear.  Eyes:     Conjunctiva/sclera: Conjunctivae normal.     Pupils: Pupils are equal, round, and reactive to light.  Cardiovascular:     Rate and Rhythm: Regular rhythm. Tachycardia present.     Pulses: Normal pulses.     Heart sounds: Normal heart sounds.  Pulmonary:     Effort: Pulmonary effort is normal.     Breath sounds: Normal breath sounds.  Abdominal:     General: Abdomen is flat. Bowel sounds are normal.  Skin:    General: Skin is warm.     Capillary Refill: Capillary refill takes less than 2 seconds.  Neurological:     General: No focal deficit present.     Mental Status: He is alert. Mental status is at baseline.  Psychiatric:        Mood and Affect: Mood normal.        Behavior: Behavior normal.  Thought Content: Thought content normal.        Judgment: Judgment normal.    No results found for any visits on 07/24/22.      Assessment & Plan:   Problem List Items Addressed This Visit   None Visit Diagnoses     Sore throat    -  Primary   Relevant Orders   POCT Influenza A/B   Culture, Group A Strep   Pharyngitis, unspecified etiology       Relevant Medications   azithromycin (ZITHROMAX) 250 MG tablet   magic mouthwash w/lidocaine SOLN       Meds ordered this encounter  Medications   azithromycin (ZITHROMAX) 250 MG tablet    Sig: Take 2 tablets on day 1, then 1 tablet daily on days 2 through 5    Dispense:  6 tablet    Refill:  0   magic mouthwash w/lidocaine SOLN    Sig: Take 10 mLs by mouth 4 (four) times daily for 7 days. Swish for 2 minutes then spit out.    Dispense:  280 mL    Refill:  0  Symptoms consistent with bacterial pharyngitis. Suspect strep, no rapid strep available. Sent for culture along  with flu swab Follow up on results via mychart Sent in Zpack along with magic mouthwash to swish and spit 4 times a day prn.   Return if symptoms worsen or fail to improve.  Suzan Slick, MD

## 2022-07-25 NOTE — Progress Notes (Signed)
Can you please let this patient know that his hemoglobin remains within the normal range while he has stopped taking testosterone. Please advise him to discuss stopping testosterone permanently with his PCP. Thank you

## 2022-07-28 ENCOUNTER — Ambulatory Visit (INDEPENDENT_AMBULATORY_CARE_PROVIDER_SITE_OTHER): Payer: BC Managed Care – PPO | Admitting: Family Medicine

## 2022-07-28 ENCOUNTER — Encounter: Payer: Self-pay | Admitting: Family Medicine

## 2022-07-28 VITALS — BP 156/80 | HR 71 | Ht 67.0 in | Wt 239.0 lb

## 2022-07-28 DIAGNOSIS — F902 Attention-deficit hyperactivity disorder, combined type: Secondary | ICD-10-CM

## 2022-07-28 DIAGNOSIS — I10 Essential (primary) hypertension: Secondary | ICD-10-CM

## 2022-07-28 DIAGNOSIS — Z7689 Persons encountering health services in other specified circumstances: Secondary | ICD-10-CM

## 2022-07-28 DIAGNOSIS — D7218 Eosinophilia in diseases classified elsewhere: Secondary | ICD-10-CM

## 2022-07-28 DIAGNOSIS — I7789 Other specified disorders of arteries and arterioles: Secondary | ICD-10-CM

## 2022-07-28 DIAGNOSIS — J4550 Severe persistent asthma, uncomplicated: Secondary | ICD-10-CM | POA: Diagnosis not present

## 2022-07-28 DIAGNOSIS — M79642 Pain in left hand: Secondary | ICD-10-CM

## 2022-07-28 LAB — CULTURE, GROUP A STREP

## 2022-07-28 MED ORDER — OLMESARTAN MEDOXOMIL 5 MG PO TABS: 5.0000 mg | ORAL_TABLET | Freq: Every day | ORAL | 1 refills | Status: DC

## 2022-07-28 MED ORDER — AMPHETAMINE-DEXTROAMPHET ER 30 MG PO CP24: 30.0000 mg | ORAL_CAPSULE | ORAL | 0 refills | Status: DC

## 2022-07-28 NOTE — Assessment & Plan Note (Signed)
Coming off of the Nucala because he is been unable to get it consistently with his health insurance.

## 2022-07-28 NOTE — Progress Notes (Signed)
Established Patient Office Visit  Subjective   Patient ID: Christian Gregory, male    DOB: 1970/10/17  Age: 51 y.o. MRN: [id number]  Chief Complaint  Patient presents with   Hypertension    HPI  Hypertension- Pt denies chest pain, SOB, dizziness, or heart palpitations.  Taking meds as directed w/o problems.  Denies medication side effects.    ADHD - Reports symptoms are well controlled on current regime. Denies any problems with insomnia, chest pain, palpitations, or SOB.    He says since he has been sick he has had some pain numbness and tingling in the left hand between the thumb and the first finger.  He has had carpal tunnel before and it feels very similar.    ROS    Objective:     BP (!) 156/80 (BP Location: Left Arm, Patient Position: Sitting, Cuff Size: Large)   Pulse 71   Ht 5\' 7"  (1.702 m)   Wt 239 lb 0.6 oz (108.4 kg)   SpO2 98%   BMI 37.44 kg/m    Physical Exam Constitutional:      Appearance: He is well-developed.  HENT:     Head: Normocephalic and atraumatic.  Cardiovascular:     Rate and Rhythm: Normal rate and regular rhythm.     Heart sounds: Normal heart sounds.  Pulmonary:     Effort: Pulmonary effort is normal.     Breath sounds: Normal breath sounds.  Skin:    General: Skin is warm and dry.  Neurological:     Mental Status: He is alert and oriented to person, place, and time.  Psychiatric:        Behavior: Behavior normal.      No results found for any visits on 07/28/22.    The 10-year ASCVD risk score (Arnett DK, et al., 2019) is: 11.4%    Assessment & Plan:   Problem List Items Addressed This Visit       Cardiovascular and Mediastinum   Uncontrolled hypertension    Blood pressure still elevated today.  Will go ahead and start an ARB.  Continue to work on healthy food choices regular exercise.  He did lose some weight recently after being sick so he is can to try to maintain his current weight.  He says he will check his blood  pressure again when he gets home today.      Relevant Medications   olmesartan (BENICAR) 5 MG tablet   ESSENTIAL HYPERTENSION, BENIGN - Primary   Relevant Medications   olmesartan (BENICAR) 5 MG tablet   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HCC)    Coming off of the Nucala because he is been unable to get it consistently with his health insurance.      Relevant Medications   olmesartan (BENICAR) 5 MG tablet     Respiratory   Asthma, allergic    Did go ahead and start allergy injections about a week ago so he has come off the Nucala for now because he was having such difficulty actually getting it filled with his insurance and so is hopeful that the allergy shots will be helpful for him.        Other   Encounter for weight management    He has not been able to consistently get the [facility] filled so he has come off of it.  He is can to try to just manage his current weight right now.  But is interested in other options in the future.  Attention deficit hyperactivity disorder (ADHD), combined type    Well controlled. Continue current regimen. Follow up in  77mo .  90-day supply sent today.      Relevant Medications   amphetamine-dextroamphetamine (ADDERALL XR) 30 MG 24 hr capsule    Left hand pain-recommend wearing night splint and see if that is improving if not please let us know.    Return in about 3 months (around 10/27/2022) for Hypertension.    Nani Gasser, MD

## 2022-07-28 NOTE — Assessment & Plan Note (Signed)
He has not been able to consistently get the [facility] filled so he has come off of it.  He is can to try to just manage his current weight right now.  But is interested in other options in the future.

## 2022-07-28 NOTE — Assessment & Plan Note (Signed)
Did go ahead and start allergy injections about a week ago so he has come off the Nucala for now because he was having such difficulty actually getting it filled with his insurance and so is hopeful that the allergy shots will be helpful for him.

## 2022-07-28 NOTE — Assessment & Plan Note (Signed)
Blood pressure still elevated today.  Will go ahead and start an ARB.  Continue to work on healthy food choices regular exercise.  He did lose some weight recently after being sick so he is can to try to maintain his current weight.  He says he will check his blood pressure again when he gets home today.

## 2022-07-28 NOTE — Assessment & Plan Note (Signed)
Well controlled. Continue current regimen. Follow up in  6mo .  90-day supply sent today.

## 2022-07-28 NOTE — Telephone Encounter (Signed)
Spoke with patient. Forwarded the corrected work note through [employer] and apologized for the prescription issues. Patient states he is scheduled with [facility] today and will bring his FMLA paperwork with him to see if this can be done there.

## 2022-07-29 ENCOUNTER — Encounter: Payer: Self-pay | Admitting: Family Medicine

## 2022-07-29 ENCOUNTER — Other Ambulatory Visit: Payer: Self-pay | Admitting: Family Medicine

## 2022-07-29 LAB — CBC WITH DIFFERENTIAL/PLATELET
Basophils Absolute: 0.1 10*3/uL (ref 0.0–0.2)
Basos: 1 %
EOS (ABSOLUTE): 0.2 10*3/uL (ref 0.0–0.4)
Eos: 2 %
Hematocrit: 48.4 % (ref 37.5–51.0)
Hemoglobin: 16.8 g/dL (ref 13.0–17.7)
Immature Grans (Abs): 0 10*3/uL (ref 0.0–0.1)
Immature Granulocytes: 0 %
Lymphocytes Absolute: 1.8 10*3/uL (ref 0.7–3.1)
Lymphs: 14 %
MCH: 31.8 pg (ref 26.6–33.0)
MCHC: 34.7 g/dL (ref 31.5–35.7)
MCV: 92 fL (ref 79–97)
Monocytes Absolute: 1 10*3/uL — ABNORMAL HIGH (ref 0.1–0.9)
Monocytes: 8 %
Neutrophils Absolute: 9.5 10*3/uL — ABNORMAL HIGH (ref 1.4–7.0)
Neutrophils: 75 %
Platelets: 349 10*3/uL (ref 150–450)
RBC: 5.29 x10E6/uL (ref 4.14–5.80)
RDW: 12.3 % (ref 11.6–15.4)
WBC: 12.6 10*3/uL — ABNORMAL HIGH (ref 3.4–10.8)

## 2022-07-31 ENCOUNTER — Ambulatory Visit: Payer: Self-pay

## 2022-08-04 ENCOUNTER — Ambulatory Visit (INDEPENDENT_AMBULATORY_CARE_PROVIDER_SITE_OTHER): Payer: BC Managed Care – PPO

## 2022-08-04 DIAGNOSIS — J309 Allergic rhinitis, unspecified: Secondary | ICD-10-CM

## 2022-08-05 ENCOUNTER — Telehealth: Payer: Self-pay

## 2022-08-05 NOTE — Telephone Encounter (Signed)
Patient came in yesterday for his allergy injection. I mistakenly gave the patient 0.05 in yellow when he was supposed got continue on his blue vial. I misread his last injection as 0.5 injection. When it was 0.05 of his blue vial. I called the patient on 08/04/22 about the error but was not able reach the patient. I am completing a safety portal for this incident and I have notified my supervisors as well . I also plan to call the patient again to check up on the patient.

## 2022-08-05 NOTE — Telephone Encounter (Signed)
I called patient again and left a message for him to call the office back to follow up about his allergy shot on 08/04/22.

## 2022-08-06 NOTE — Telephone Encounter (Signed)
[  11:16 AM / 08/06/22]   I spoke to the patient and he is doing fine no reaction from the injection and I also apologized to him as well.

## 2022-08-29 ENCOUNTER — Other Ambulatory Visit: Payer: Self-pay | Admitting: Allergy and Immunology

## 2022-08-29 DIAGNOSIS — J4550 Severe persistent asthma, uncomplicated: Secondary | ICD-10-CM

## 2022-09-25 ENCOUNTER — Other Ambulatory Visit (HOSPITAL_COMMUNITY): Payer: Self-pay

## 2022-09-25 ENCOUNTER — Ambulatory Visit (INDEPENDENT_AMBULATORY_CARE_PROVIDER_SITE_OTHER): Payer: BC Managed Care – PPO | Admitting: Pulmonary Disease

## 2022-09-25 ENCOUNTER — Encounter: Payer: Self-pay | Admitting: Pulmonary Disease

## 2022-09-25 VITALS — BP 134/82 | HR 82 | Temp 98.6°F | Wt 244.6 lb

## 2022-09-25 DIAGNOSIS — J310 Chronic rhinitis: Secondary | ICD-10-CM | POA: Diagnosis not present

## 2022-09-25 DIAGNOSIS — J4551 Severe persistent asthma with (acute) exacerbation: Secondary | ICD-10-CM

## 2022-09-25 MED ORDER — PREDNISONE 10 MG PO TABS: ORAL_TABLET | ORAL | 0 refills | Status: AC

## 2022-09-25 NOTE — Patient Instructions (Addendum)
After talking with our pharmacy to get restarted with your Nucala shots you need to call [facility] and ask to speak to someone to obtain for a new copay card. And then when you get that information call [place] and give them the new updated information.   [facility] [place] number: [place] Number [phone]   I will talk with our pharmacy about the info you shared about the new card  Please send me the attachment of that letter  Prednisone taper as prescribed - but lets work to get the [facility] ASAP  Return to clinic in 6 months or sooner as needed

## 2022-09-29 NOTE — Progress Notes (Signed)
$@Patients$  ID: Christian Gregory, male    DOB: 08-18-1971, 52 y.o.   MRN: [id number]  Chief Complaint  Patient presents with   Follow-up    Pt is here for follow up for allergic sinusitis. He is needing to restart nucala. He is taking trelegy high dose daily and albuterol as needed. Last dose of nucala was 3 months ago.     Referring provider: Hali Marry, *  HPI:   51 y.o. man with past medical history of asthma and EBPA treated with Nucala lost to follow-up at [facility] who is here for follow up for evaluation of asthma.  Most recent note .  Had been doing well. Lost access to [place] in October. It seems new copay card did not go through or [facility] did not help out and update info when he sent it to them. Did not ocntact [place]. Was trying allergy shots. But too expensive. More sinus issues, wheeze, cough, DOE since off Nucala. All much better controlled prior.   HPI at initial visit: Patient with longstanding history of asthma.  Recurrent exacerbations.  Was placed on Nucala in the past.  This greatly reduced burden of symptoms.  Really was not using inhalers very much.  Was lost to follow-up at [facility].  Nucala last dose in 2021.  In the interim, he has developed worsening shortness of breath.  Worsening cough.  Wheeze.  Worse in the evenings.  Symptoms worse with inclines or stairs.  Worse when outside.  Worse with change of seasons.  No other relieving or exacerbating factors.  Most recent CT chest 02/06/2016 reviewed and interpreted as clear lungs bilaterally.  Patient has recurrent sinusitis.  Underwent sinus surgery August 2017.  No eosinophils.  Despite this he can have frequent exacerbations.  Need to start antibiotics frequently.  History with high-dose steroids and methotrexate in the past.  Methotrexate irritated his stomach.  This is back in 2018.  ANCA was positive at that time.  E GPA  was clinically diagnosed based on clinical criteria.  Unfortunately his was not very steroid  responsive.  1 point time he was on prednisone 10 mg daily as well as Imuran 50 mg daily.  This got a large acute pulmonary notes.  Eventually he was transitioned to [facility] with better control of sinus as well as breathing issues.  Tried multiple different inhalers.  Currently not taking any other immunosuppressive medicines  PMH: Asthma, attention deficit disorder Surgical history: Esophageal dilation Family history: Hypertension in father, CVA Father Social history: Never smoker, lives in [place] / Pulmonary Flowsheets:   ACT:  Asthma Control Test ACT Total Score  06/24/2022  3:00 PM 19  03/12/2022  3:42 PM 22  09/03/2021  4:40 PM 19   MMRC:     No data to display           Epworth:      No data to display           Tests:   FENO:  No results found for: "NITRICOXIDE"  PFT:     No data to display           WALK:      No data to display           Imaging: Personally reviewed as per EMR discussion this note  Lab Results: Personally reviewed CBC    Component Value Date/Time   WBC 12.6 (H) 07/22/2022 1522   WBC 9.2 04/22/2022 0000   RBC 5.29  07/22/2022 1522   RBC 6.09 (H) 04/22/2022 0000   HGB 16.8 07/22/2022 1522   HCT 48.4 07/22/2022 1522   PLT 349 07/22/2022 1522   MCV 92 07/22/2022 1522   MCH 31.8 07/22/2022 1522   MCH 31.4 04/22/2022 0000   MCHC 34.7 07/22/2022 1522   MCHC 34.0 04/22/2022 0000   RDW 12.3 07/22/2022 1522   LYMPHSABS 1.8 07/22/2022 1522   MONOABS 902 01/05/2017 1534   EOSABS 0.2 07/22/2022 1522   BASOSABS 0.1 07/22/2022 1522    BMET    Component Value Date/Time   NA 136 04/22/2022 0000   K 4.1 04/22/2022 0000   CL 103 04/22/2022 0000   CO2 22 04/22/2022 0000   GLUCOSE 124 04/22/2022 0000   BUN 14 04/22/2022 0000   CREATININE 0.92 04/22/2022 0000   CALCIUM 9.3 04/22/2022 0000   GFRNONAA 81 11/06/2020 0000   GFRAA 94 11/06/2020 0000    BNP No results found for: "BNP"  ProBNP No  results found for: "PROBNP"  Specialty Problems       Pulmonary Problems   Asthma, allergic   Chronic rhinitis     Allergies  Allergen Reactions   Penicillins Anaphylaxis and Swelling    REACTION: anaphalatic     Losartan Other (See Comments)    Dizziness and leg pain   Testosterone Other (See Comments)    Significantly elevated hemoglobin.   Lisinopril Diarrhea    ED    Immunization History  Administered Date(s) Administered   Influenza,inj,Quad PF,6+ Mos 07/07/2017   Influenza-Unspecified 06/01/2018   Tdap 07/20/2018    Past Medical History:  Diagnosis Date   Anxiety    anger problems   Eosinophilic esophagitis    Fatty liver    History of methicillin resistant staphylococcus aureus (MRSA)    Hypertension    Obesity     Tobacco History: Social History   Tobacco Use  Smoking Status Never  Smokeless Tobacco Never   Counseling given: Not Answered   Continue to not smoke  Outpatient Encounter Medications as of 09/25/2022  Medication Sig   albuterol (VENTOLIN HFA) 108 (90 Base) MCG/ACT inhaler INHALE 2 PUFFS INTO THE LUNGS EVERY 4 (FOUR) HOURS AS NEEDED FOR WHEEZING OR SHORTNESS OF BREATH. 2 PUFFS AS NEEDED DAILY   ALPRAZolam (XANAX) 0.5 MG tablet Take 1 tablet (0.5 mg total) by mouth at bedtime as needed.   amphetamine-dextroamphetamine (ADDERALL XR) 30 MG 24 hr capsule Take 1 capsule (30 mg total) by mouth every morning.   Carbinoxamine Maleate 4 MG TABS Take 2 tablets (8 mg total) by mouth 2 (two) times daily as needed.   EPINEPHrine 0.3 mg/0.3 mL IJ SOAJ injection Inject 0.3 mg into the muscle as needed for anaphylaxis.   Insulin Pen Needle 32G X 4 MM MISC Use to inject into the skin daily   ipratropium (ATROVENT) 0.06 % nasal spray Place 2 sprays into both nostrils 4 (four) times daily.   Mepolizumab (NUCALA) 100 MG/ML SOAJ INJECT THE CONTENTS OF 1 PEN (100 MG) UNDER THE SKIN EVERY 4 WEEKS   metoprolol succinate (TOPROL-XL) 50 MG 24 hr tablet Take 1  tablet (50 mg total) by mouth daily.   olmesartan (BENICAR) 5 MG tablet Take 1 tablet (5 mg total) by mouth daily.   Olopatadine HCl (PATADAY) 0.2 % SOLN Place 1 drop into both eyes 1 day or 1 dose.   Olopatadine-Mometasone (RYALTRIS) [id number] MCG/ACT SUSP Place 2 sprays into the nose in the morning and at bedtime.  predniSONE (DELTASONE) 10 MG tablet Take 4 tablets (40 mg total) by mouth daily with breakfast for 5 days, THEN 2 tablets (20 mg total) daily with breakfast for 5 days, THEN 1 tablet (10 mg total) daily with breakfast for 5 days.   TRELEGY ELLIPTA 200-62.5-25 MCG/ACT AEPB TAKE 1 PUFF BY MOUTH EVERY DAY   No facility-administered encounter medications on file as of 09/25/2022.     Review of Systems  Review of Systems  N/a Physical Exam  BP 134/82 (BP Location: Left Arm, Patient Position: Sitting, Cuff Size: Normal)   Pulse 82   Temp 98.6 F (37 C) (Oral)   Wt 244 lb 9.6 oz (110.9 kg)   SpO2 98%   BMI 38.31 kg/m   Wt Readings from Last 5 Encounters:  09/25/22 244 lb 9.6 oz (110.9 kg)  07/28/22 239 lb 0.6 oz (108.4 kg)  07/24/22 234 lb 1 oz (106.2 kg)  05/20/22 244 lb (110.7 kg)  04/22/22 238 lb (108 kg)    BMI Readings from Last 5 Encounters:  09/25/22 38.31 kg/m  07/28/22 37.44 kg/m  07/24/22 36.66 kg/m  05/20/22 38.30 kg/m  04/22/22 37.28 kg/m     Physical Exam General: In exam chair, no acute distress Eyes: EOMI, icterus Neck: Supple, no JVP Pulmonary: Wheezing bilaterally, normal work of breathing Cardiovascular: Regular rate and rhythm, no murmur Abdomen: Nondistended, bowel sounds present MSK: No synovitis, no joint effusion Neuro: Normal gait, no weakness Psych: Normal mood, full affect.   Assessment & Plan:   Asthma: Present for many years.   Continue Trelegy for ICS/LABA/LAMA therapy due to symptom burden.  Symptoms worse last 3 months off Nucala. To resume shortly once cost issue sorted, pharmacy team contacted. Prednisone course  prescribed today with wheezing on exam, worsened symptoms.   Seasonal allergies: Following with Allergist.  Stopped allergy shots due to cost.  Dyspnea on exertion: Suspect related to poorly controlled asthma.  See above.  EGPA: Diagnosed clinically at [facility].  ANCA positive in the past.  Significant sinus issues but improved overall.  Continue Nucala.  May need to reengage rheumatology in the future.   Return in about 6 months (around 03/26/2023).   Lanier Clam, MD

## 2022-10-01 ENCOUNTER — Telehealth: Payer: Self-pay | Admitting: Pulmonary Disease

## 2022-10-01 ENCOUNTER — Other Ambulatory Visit (HOSPITAL_COMMUNITY): Payer: Self-pay

## 2022-10-01 NOTE — Telephone Encounter (Signed)
Patient called to check the status of his medication assistance with Nucala.  He stated that he was told by the nurse that she would check into it.  Patient has not heard anything yet and would like to know the status.  Please call patient to discuss at [phone]

## 2022-10-01 NOTE — Telephone Encounter (Signed)
Per [facility] portal, order delayed (waiting for payment). Called [facility] for status update on patient's Nucala. Per rep shipment is ready for scheduling. Patient states he went through this last month after he provided copay card information but never received shipment. Conference called with patient to set up shipment for 10/03/2022.  Will check in on [facility] Portal to ensure shipment has gone out.  Phone: [phone]  Knox Saliva, PharmD, MPH, BCPS, CPP Clinical Pharmacist (Rheumatology and Pulmonology)

## 2022-10-03 NOTE — Telephone Encounter (Signed)
Per [facility] portal, shipment of Nucala has left facility and tracking number is assigned. Nothing further needed  Knox Saliva, PharmD, MPH, BCPS, CPP Clinical Pharmacist (Rheumatology and Pulmonology)

## 2022-10-13 ENCOUNTER — Other Ambulatory Visit: Payer: Self-pay | Admitting: Family Medicine

## 2022-10-13 DIAGNOSIS — I10 Essential (primary) hypertension: Secondary | ICD-10-CM

## 2022-10-20 ENCOUNTER — Other Ambulatory Visit: Payer: Self-pay | Admitting: Family Medicine

## 2022-10-20 DIAGNOSIS — E291 Testicular hypofunction: Secondary | ICD-10-CM

## 2022-10-20 NOTE — Telephone Encounter (Signed)
Rx sent to pharmacy but needs 6 mo f/u. Dur for labs.  Go about 1 week after next injection.    Meds ordered this encounter  Medications   testosterone cypionate (DEPOTESTOSTERONE CYPIONATE) 200 MG/ML injection    Sig: INJECT 1 ML (200 MG TOTAL) INTO THE MUSCLE EVERY 14 DAYS    Dispense:  6 mL    Refill:  0    Not to exceed 4 additional fills before 10/21/2022

## 2022-10-21 NOTE — Telephone Encounter (Signed)
Please contact the patient for scheduling. Per Dr. Madilyn Fireman, a 6 month follow up is due for testosterone rx. Patient is due for labs as well. Patient should complete labs 1 week after their next injection. Thanks in advance.

## 2022-10-21 NOTE — Telephone Encounter (Signed)
That is perfect.  

## 2022-10-21 NOTE — Telephone Encounter (Signed)
Patient would like to be seen for testosterone rx on the visit scheduled for 10/28/2022, Is that ok?

## 2022-10-22 NOTE — Telephone Encounter (Signed)
Just to clarify the prescription was already sent March 4.  The appointment is just to address the hypogonadal some for which she takes the testosterone.

## 2022-10-22 NOTE — Telephone Encounter (Signed)
Informed patient that its ok for him to get testosterone rx on his appointment on 10/28/2022, thanks.

## 2022-10-24 ENCOUNTER — Telehealth: Payer: Self-pay | Admitting: Pharmacist

## 2022-10-24 NOTE — Telephone Encounter (Signed)
PT was is calling needing the PA team to reach out to the insurance company again. See tel encounter from Feb. Same issue, same drug, same ins co. Thank you.   PLease call PT to advise of status.

## 2022-10-27 NOTE — Telephone Encounter (Addendum)
Patient requires PA renewal. This is not the same issue as Februrary 2024.  Submitted an URGENT Prior Authorization RENEWAL request to [facility] for NUCALA via CoverMyMeds.   Key: [id number]  Knox Saliva, PharmD, MPH, BCPS, CPP Clinical Pharmacist (Rheumatology and Pulmonology)

## 2022-10-28 ENCOUNTER — Ambulatory Visit (INDEPENDENT_AMBULATORY_CARE_PROVIDER_SITE_OTHER): Payer: BC Managed Care – PPO | Admitting: Family Medicine

## 2022-10-28 ENCOUNTER — Encounter: Payer: Self-pay | Admitting: Family Medicine

## 2022-10-28 VITALS — BP 132/75 | HR 74 | Ht 67.0 in | Wt 242.0 lb

## 2022-10-28 DIAGNOSIS — E291 Testicular hypofunction: Secondary | ICD-10-CM | POA: Diagnosis not present

## 2022-10-28 DIAGNOSIS — E1165 Type 2 diabetes mellitus with hyperglycemia: Secondary | ICD-10-CM | POA: Diagnosis not present

## 2022-10-28 DIAGNOSIS — R7301 Impaired fasting glucose: Secondary | ICD-10-CM

## 2022-10-28 DIAGNOSIS — Z1211 Encounter for screening for malignant neoplasm of colon: Secondary | ICD-10-CM

## 2022-10-28 DIAGNOSIS — I10 Essential (primary) hypertension: Secondary | ICD-10-CM | POA: Diagnosis not present

## 2022-10-28 DIAGNOSIS — E785 Hyperlipidemia, unspecified: Secondary | ICD-10-CM | POA: Diagnosis not present

## 2022-10-28 DIAGNOSIS — D7218 Eosinophilia in diseases classified elsewhere: Secondary | ICD-10-CM

## 2022-10-28 DIAGNOSIS — I7789 Other specified disorders of arteries and arterioles: Secondary | ICD-10-CM

## 2022-10-28 DIAGNOSIS — Z0000 Encounter for general adult medical examination without abnormal findings: Secondary | ICD-10-CM

## 2022-10-28 LAB — POCT GLYCOSYLATED HEMOGLOBIN (HGB A1C): Hemoglobin A1C: 7.3 % — AB (ref 4.0–5.6)

## 2022-10-28 MED ORDER — TIRZEPATIDE 2.5 MG/0.5ML ~~LOC~~ SOAJ: 2.5000 mg | SUBCUTANEOUS | 0 refills | Status: DC

## 2022-10-28 MED ORDER — TIRZEPATIDE 5 MG/0.5ML ~~LOC~~ SOAJ: 5.0000 mg | SUBCUTANEOUS | 0 refills | Status: DC

## 2022-10-28 NOTE — Assessment & Plan Note (Signed)
A1c elevated at 7.3 today, uncontrolled.  He has been off of medication since July.  His insurance would no longer cover it.  We discussed restarting a GLP-1.  Will start with Mounjaro.  Follow-up in 3 months.  Continue to work on [employer], portion control and cutting back on carbs.

## 2022-10-28 NOTE — Assessment & Plan Note (Signed)
He was able to get the Nucala approved and so they should be shipping it to him soon he is hopeful to get back on it.  He said he noticed after the first month without it he started having symptoms.

## 2022-10-28 NOTE — Assessment & Plan Note (Signed)
Blood pressure just a little borderline today but otherwise okay we will continue to monitor.  Continue to work on weight loss.

## 2022-10-28 NOTE — Assessment & Plan Note (Signed)
To recheck testosterone level with labs as well as PSA.

## 2022-10-28 NOTE — Progress Notes (Signed)
Established Patient Office Visit  Subjective   Patient ID: Christian Gregory, male    DOB: 1971/03/03  Age: 52 y.o. MRN: [id number]  Chief Complaint  Patient presents with   Hypertension    HPI  Hypertension- Pt denies chest pain, SOB, dizziness, or heart palpitations.  Taking meds as directed w/o problems.  Denies medication side effects.    Diabetes - no hypoglycemic events. No wounds or sores that are not healing well. No increased thirst or urination. Checking glucose at home. Taking medications as prescribed without any side effects.     ROS    Objective:     BP 132/75   Pulse 74   Ht '5\' 7"'$  (1.702 m)   Wt 242 lb (109.8 kg)   SpO2 98%   BMI 37.90 kg/m    Physical Exam Constitutional:      Appearance: He is well-developed.  HENT:     Head: Normocephalic and atraumatic.  Cardiovascular:     Rate and Rhythm: Normal rate and regular rhythm.     Heart sounds: Normal heart sounds.  Pulmonary:     Effort: Pulmonary effort is normal.     Breath sounds: Normal breath sounds.  Skin:    General: Skin is warm and dry.  Neurological:     Mental Status: He is alert and oriented to person, place, and time.  Psychiatric:        Behavior: Behavior normal.      Results for orders placed or performed in visit on 10/28/22  POCT glycosylated hemoglobin (Hb A1C)  Result Value Ref Range   Hemoglobin A1C 7.3 (A) 4.0 - 5.6 %   HbA1c POC (<> result, manual entry)     HbA1c, POC (prediabetic range)     HbA1c, POC (controlled diabetic range)        The 10-year ASCVD risk score (Arnett DK, et al., 2019) is: 8.6%    Assessment & Plan:   Problem List Items Addressed This Visit       Cardiovascular and Mediastinum   Essential hypertension, benign - Primary    Blood pressure just a little borderline today but otherwise okay we will continue to monitor.  Continue to work on weight loss.      Relevant Orders   COMPLETE METABOLIC PANEL WITH GFR   Lipid Panel w/reflex  Direct LDL   Testosterone Total,Free,Bio, Males   PSA   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[place])    He was able to get the Nucala approved and so they should be shipping it to him soon he is hopeful to get back on it.  He said he noticed after the first month without it he started having symptoms.        Endocrine   Uncontrolled type 2 diabetes mellitus with hyperglycemia (HCC)    A1c elevated at 7.3 today, uncontrolled.  He has been off of medication since July.  His insurance would no longer cover it.  We discussed restarting a GLP-1.  Will start with Mounjaro.  Follow-up in 3 months.  Continue to work on [employer], portion control and cutting back on carbs.      Relevant Medications   tirzepatide [facility]) 2.5 MG/0.5ML Pen   tirzepatide Darcel Bayley) 5 MG/0.5ML Pen (Start on 11/18/2022)   Hypogonadism male    To recheck testosterone level with labs as well as PSA.      Relevant Orders   COMPLETE METABOLIC PANEL WITH GFR   Lipid Panel w/reflex Direct LDL  Testosterone Total,Free,Bio, Males   PSA     Other   Hyperlipidemia   Relevant Orders   COMPLETE METABOLIC PANEL WITH GFR   Lipid Panel w/reflex Direct LDL   Testosterone Total,Free,Bio, Males   PSA   Other Visit Diagnoses     Impaired fasting glucose       Relevant Orders   POCT glycosylated hemoglobin (Hb A1C) (Completed)   COMPLETE METABOLIC PANEL WITH GFR   Lipid Panel w/reflex Direct LDL   Testosterone Total,Free,Bio, Males   PSA   Screening for malignant neoplasm of colon       Relevant Orders   Ambulatory referral to Gastroenterology   Routine general medical examination at a health care facility       Relevant Orders   COMPLETE METABOLIC PANEL WITH GFR   Lipid Panel w/reflex Direct LDL   Testosterone Total,Free,Bio, Males   PSA      Coming on Friday for CPE and labs.    Return in about 3 months (around 02/11/2023) for Diabetes follow-up.    Beatrice Lecher, MD

## 2022-10-28 NOTE — Telephone Encounter (Signed)
Received notification from [facility] regarding a prior authorization for Viola. Authorization has been APPROVED from 10/27/22 to 10/27/23. Approval letter sent to scan center.  Patient can continue to fill through [place]: [phone]  Authorization # [id number]  MyChart message sent to patient to advise  Knox Saliva, PharmD, MPH, BCPS, CPP Clinical Pharmacist (Rheumatology and Pulmonology)

## 2022-10-29 ENCOUNTER — Telehealth: Payer: Self-pay

## 2022-10-29 ENCOUNTER — Encounter: Payer: Self-pay | Admitting: Family Medicine

## 2022-10-29 NOTE — Telephone Encounter (Addendum)
Initiated Prior authorization [id number] 2.'5MG'$ /0.5ML pen-injectors Via: Covermymeds Case/Key:[id number] Status: approved as of 10/29/22 Reason:dates not given Notified Pt via: Mychart

## 2022-10-31 ENCOUNTER — Encounter: Payer: Self-pay | Admitting: Family Medicine

## 2022-10-31 ENCOUNTER — Ambulatory Visit (INDEPENDENT_AMBULATORY_CARE_PROVIDER_SITE_OTHER): Payer: BC Managed Care – PPO | Admitting: Family Medicine

## 2022-10-31 VITALS — BP 133/79 | HR 68 | Ht 67.0 in | Wt 242.0 lb

## 2022-10-31 DIAGNOSIS — E785 Hyperlipidemia, unspecified: Secondary | ICD-10-CM | POA: Diagnosis not present

## 2022-10-31 DIAGNOSIS — Z0000 Encounter for general adult medical examination without abnormal findings: Secondary | ICD-10-CM

## 2022-10-31 DIAGNOSIS — E291 Testicular hypofunction: Secondary | ICD-10-CM | POA: Diagnosis not present

## 2022-10-31 DIAGNOSIS — I10 Essential (primary) hypertension: Secondary | ICD-10-CM | POA: Diagnosis not present

## 2022-10-31 DIAGNOSIS — R7301 Impaired fasting glucose: Secondary | ICD-10-CM | POA: Diagnosis not present

## 2022-10-31 NOTE — Progress Notes (Signed)
Complete physical exam  Patient: Christian Gregory   DOB: [birth year]   52 y.o. Male  MRN: [id number]  Subjective:    Chief Complaint  Patient presents with   Annual Exam    Christian Gregory is a 52 y.o. male who presents today for a complete physical exam. He reports consuming a general diet. The patient does not participate in regular exercise at present. He generally feels well. He reports sleeping fairly well. He does not have additional problems to discuss today.   Colonoscopy is scheduled.  He did get labs drawn today.   Most recent fall risk assessment:    10/31/2022    4:13 PM  [place] in the past year? 0  Number falls in past yr: 0  Injury with Fall? 0  Risk for fall due to : No Fall Risks  Follow up Falls evaluation completed     Most recent depression screenings:    10/31/2022    4:14 PM 10/28/2022    4:03 PM  PHQ 2/9 Scores  PHQ - 2 Score 0 0        Patient Care Team: Hali Marry, MD as PCP - General (Family Medicine)   Outpatient Medications Prior to Visit  Medication Sig   albuterol (VENTOLIN HFA) 108 (90 Base) MCG/ACT inhaler INHALE 2 PUFFS INTO THE LUNGS EVERY 4 (FOUR) HOURS AS NEEDED FOR WHEEZING OR SHORTNESS OF BREATH. 2 PUFFS AS NEEDED DAILY   ALPRAZolam (XANAX) 0.5 MG tablet Take 1 tablet (0.5 mg total) by mouth at bedtime as needed.   amphetamine-dextroamphetamine (ADDERALL XR) 30 MG 24 hr capsule Take 1 capsule (30 mg total) by mouth every morning.   EPINEPHrine 0.3 mg/0.3 mL IJ SOAJ injection Inject 0.3 mg into the muscle as needed for anaphylaxis.   Insulin Pen Needle 32G X 4 MM MISC Use to inject into the skin daily   ipratropium (ATROVENT) 0.06 % nasal spray Place 2 sprays into both nostrils 4 (four) times daily.   Mepolizumab (NUCALA) 100 MG/ML SOAJ INJECT THE CONTENTS OF 1 PEN (100 MG) UNDER THE SKIN EVERY 4 WEEKS   metoprolol succinate (TOPROL-XL) 50 MG 24 hr tablet TAKE 1 TABLET BY MOUTH EVERY DAY   olmesartan (BENICAR) 5  MG tablet Take 1 tablet (5 mg total) by mouth daily.   Olopatadine HCl (PATADAY) 0.2 % SOLN Place 1 drop into both eyes 1 day or 1 dose.   Olopatadine-Mometasone (RYALTRIS) [id number] MCG/ACT SUSP Place 2 sprays into the nose in the morning and at bedtime.   testosterone cypionate (DEPOTESTOSTERONE CYPIONATE) 200 MG/ML injection INJECT 1 ML (200 MG TOTAL) INTO THE MUSCLE EVERY 14 DAYS   tirzepatide (MOUNJARO) 2.5 MG/0.5ML Pen Inject 2.5 mg into the skin once a week.   [START ON 11/18/2022] tirzepatide (MOUNJARO) 5 MG/0.5ML Pen Inject 5 mg into the skin once a week.   [place] 200-62.5-25 MCG/ACT AEPB TAKE 1 PUFF BY MOUTH EVERY DAY   No facility-administered medications prior to visit.    ROS        Objective:     BP 133/79   Pulse 68   Ht 5\' 7"  (1.702 m)   Wt 242 lb (109.8 kg)   SpO2 97%   BMI 37.90 kg/m    Physical Exam Constitutional:      Appearance: He is well-developed.  HENT:     Head: Normocephalic and atraumatic.     Right Ear: Tympanic membrane, ear canal and external ear  normal.     Left Ear: Tympanic membrane, ear canal and external ear normal.     Nose: Nose normal.     Mouth/Throat:     Pharynx: Oropharynx is clear.  Eyes:     Conjunctiva/sclera: Conjunctivae normal.     Pupils: Pupils are equal, round, and reactive to light.  Neck:     Thyroid: No thyromegaly.  Cardiovascular:     Rate and Rhythm: Normal rate and regular rhythm.     Heart sounds: Normal heart sounds.  Pulmonary:     Effort: Pulmonary effort is normal.     Breath sounds: Normal breath sounds.  Abdominal:     General: Bowel sounds are normal. There is no distension.     Palpations: Abdomen is soft. There is no mass.     Tenderness: There is no abdominal tenderness. There is no guarding or rebound.  Musculoskeletal:        General: Normal range of motion.     Cervical back: Normal range of motion and neck supple. No tenderness.  Lymphadenopathy:     Cervical: No cervical  adenopathy.  Skin:    General: Skin is warm and dry.  Neurological:     Mental Status: He is alert and oriented to person, place, and time.     Deep Tendon Reflexes: Reflexes are normal and symmetric.  Psychiatric:        Behavior: Behavior normal.        Thought Content: Thought content normal.        Judgment: Judgment normal.      No results found for any visits on 10/31/22.     Assessment & Plan:    Routine Health Maintenance and Physical Exam  Immunization History  Administered Date(s) Administered   Influenza,inj,Quad PF,6+ Mos 07/07/2017   Influenza-Unspecified 06/01/2018   Tdap 07/20/2018    Health Maintenance  Topic Date Due   INFLUENZA VACCINE  11/16/2022 (Originally 03/18/2022)   Zoster Vaccines- Shingrix (1 of 2) 01/31/2023 (Originally 02/20/1989)   COLONOSCOPY (Pts 45-51yrs Insurance coverage will need to be confirmed)  03/18/2023 (Originally 02/21/2015)   OPHTHALMOLOGY EXAM  10/31/2023 (Originally 02/21/1980)   Hepatitis C Screening  10/31/2023 (Originally 02/21/1988)   COVID-19 Vaccine (1) 11/13/2023 (Originally 02/21/1975)   Diabetic kidney evaluation - Urine ACR  12/20/2022   Diabetic kidney evaluation - eGFR measurement  04/23/2023   HEMOGLOBIN A1C  04/30/2023   FOOT EXAM  10/31/2023   DTaP/Tdap/Td (2 - Td or Tdap) 07/20/2028   HIV Screening  Completed   Pneumococcal Vaccine 36-60 Years old  Aged Out   HPV VACCINES  Aged Out    Discussed health benefits of physical activity, and encouraged him to engage in regular exercise appropriate for his age and condition.  Problem List Items Addressed This Visit   None Visit Diagnoses     Wellness examination    -  Primary   Relevant Orders   Urine Microalbumin w/creat. ratio       Keep up a regular exercise program and make sure you are eating a healthy diet Try to eat 4 servings of dairy a day, or if you are lactose intolerant take a calcium with vitamin D daily.  Your vaccines are up to date.   No  follow-ups on file.   Foot/Ankle Musculoskeletal Exam  General     Constitutional: well-developed   Neurological: alert  Diabetic Foot Exam - Simple   Simple Foot Form Diabetic Foot exam was performed with the following  findings: Yes 10/31/2022  4:40 PM  Visual Inspection See comments: Yes Sensation Testing Intact to touch and monofilament testing bilaterally: Yes Pulse Check Posterior Tibialis and Dorsalis pulse intact bilaterally: Yes Comments Pt has thick toenails. His big toenails have fungus and he has signs of athletes foot.        Beatrice Lecher, MD

## 2022-11-01 LAB — COMPLETE METABOLIC PANEL WITH GFR
AG Ratio: 1.4 (calc) (ref 1.0–2.5)
ALT: 46 U/L (ref 9–46)
AST: 26 U/L (ref 10–35)
Albumin: 4.4 g/dL (ref 3.6–5.1)
Alkaline phosphatase (APISO): 56 U/L (ref 35–144)
BUN: 19 mg/dL (ref 7–25)
CO2: 28 mmol/L (ref 20–32)
Calcium: 9.4 mg/dL (ref 8.6–10.3)
Chloride: 102 mmol/L (ref 98–110)
Creat: 1.01 mg/dL (ref 0.70–1.30)
Globulin: 3.1 g/dL (calc) (ref 1.9–3.7)
Glucose, Bld: 182 mg/dL — ABNORMAL HIGH (ref 65–99)
Potassium: 5 mmol/L (ref 3.5–5.3)
Sodium: 142 mmol/L (ref 135–146)
Total Bilirubin: 0.5 mg/dL (ref 0.2–1.2)
Total Protein: 7.5 g/dL (ref 6.1–8.1)
eGFR: 89 mL/min/{1.73_m2} (ref >=60)

## 2022-11-01 LAB — MICROALBUMIN / CREATININE URINE RATIO
Creatinine, Urine: 121 mg/dL (ref 20–320)
Microalb, Ur: <0.2 mg/dL

## 2022-11-01 LAB — LIPID PANEL W/REFLEX DIRECT LDL
Cholesterol: 224 mg/dL — ABNORMAL HIGH (ref <200)
HDL: 49 mg/dL (ref >=40)
LDL Cholesterol (Calc): 127 mg/dL (calc) — ABNORMAL HIGH
Non-HDL Cholesterol (Calc): 175 mg/dL (calc) — ABNORMAL HIGH (ref <130)
Total CHOL/HDL Ratio: 4.6 (calc) (ref <5.0)
Triglycerides: 339 mg/dL — ABNORMAL HIGH (ref <150)

## 2022-11-01 LAB — TESTOSTERONE TOTAL,FREE,BIO, MALES
Albumin: 4.4 g/dL (ref 3.6–5.1)
Sex Hormone Binding: 19 nmol/L (ref 10–50)
Testosterone, Bioavailable: 274.9 ng/dL (ref 110.0–575.0)
Testosterone, Free: 136.5 pg/mL (ref 46.0–224.0)
Testosterone: 627 ng/dL (ref 250–827)

## 2022-11-01 LAB — PSA: PSA: 0.45 ng/mL (ref <=4.00)

## 2022-11-03 NOTE — Progress Notes (Signed)
Not enough urine to properly test for protein levels.

## 2022-11-03 NOTE — Progress Notes (Signed)
HI Sarge, your cholesterol is too high. We really need to start a statin to reduce your cardiovascular risk for stroke and heart attack. If you are ok with this let us know and I will send over atorvastatin 20mg  nightly.  Then we can recheck your level in 3 months. Testosterone looks great!

## 2022-11-04 ENCOUNTER — Encounter: Payer: Self-pay | Admitting: Family Medicine

## 2022-11-04 DIAGNOSIS — B351 Tinea unguium: Secondary | ICD-10-CM

## 2022-11-05 MED ORDER — TERBINAFINE HCL 250 MG PO TABS: 250.0000 mg | ORAL_TABLET | Freq: Every day | ORAL | 1 refills | Status: DC

## 2022-11-06 ENCOUNTER — Telehealth: Payer: Self-pay | Admitting: Neurology

## 2022-11-06 ENCOUNTER — Encounter: Payer: Self-pay | Admitting: Family Medicine

## 2022-11-06 ENCOUNTER — Ambulatory Visit (INDEPENDENT_AMBULATORY_CARE_PROVIDER_SITE_OTHER): Payer: BC Managed Care – PPO | Admitting: Family Medicine

## 2022-11-06 VITALS — BP 158/81 | HR 66 | Temp 97.8°F

## 2022-11-06 DIAGNOSIS — D7218 Eosinophilia in diseases classified elsewhere: Secondary | ICD-10-CM

## 2022-11-06 DIAGNOSIS — I7789 Other specified disorders of arteries and arterioles: Secondary | ICD-10-CM

## 2022-11-06 DIAGNOSIS — H538 Other visual disturbances: Secondary | ICD-10-CM | POA: Diagnosis not present

## 2022-11-06 DIAGNOSIS — R519 Headache, unspecified: Secondary | ICD-10-CM

## 2022-11-06 DIAGNOSIS — R110 Nausea: Secondary | ICD-10-CM | POA: Diagnosis not present

## 2022-11-06 DIAGNOSIS — R0789 Other chest pain: Secondary | ICD-10-CM

## 2022-11-06 LAB — GLUCOSE, POCT (MANUAL RESULT ENTRY): POC Glucose: 159 mg/dl — AB (ref 70–99)

## 2022-11-06 MED ORDER — ONDANSETRON HCL 4 MG PO TABS: 4.0000 mg | ORAL_TABLET | Freq: Three times a day (TID) | ORAL | 0 refills | Status: DC | PRN

## 2022-11-06 NOTE — Telephone Encounter (Signed)
Pt was placed on providers schedule and seen

## 2022-11-06 NOTE — Progress Notes (Signed)
Acute Office Visit  Subjective:     Patient ID: Christian Gregory, male    DOB: [birth year], 52 y.o.   MRN: [id number]  Chief Complaint  Patient presents with   Hypertension   Blurred Vision    HPI Patient is in today for  Patient walked in asking to have his blood pressure checked. He states around 11 am he started having some blurry vision in his latera edge of his right eye. It went away quickly, but after eating lunch developed headache and  on both side of his head and temples. Then decied to drive home and started to feel weak . He has taken some aleve and headache is a little better. No having some nausea. He had a salad for lunch.  No exposure to GI sxs.     Checked his blood pressure. 158/81, heart rate 66. He wanted to make sure he wasn't "stroking out" he states he just "feels funny". I advised him to be seen at urgent care based on symptoms. He will go now. FYI.    He denies any chest pain.  While he was here resting on the table he started to feel just a little bit of discomfort.  ROS      Objective:    BP (!) 158/81   Pulse 66   Temp 97.8 F (36.6 C)    Physical Exam Constitutional:      Appearance: He is well-developed.  HENT:     Head: Normocephalic and atraumatic.     Right Ear: Tympanic membrane, ear canal and external ear normal.     Left Ear: Tympanic membrane, ear canal and external ear normal.     Nose: Nose normal.     Mouth/Throat:     Pharynx: Oropharynx is clear.  Eyes:     Conjunctiva/sclera: Conjunctivae normal.     Pupils: Pupils are equal, round, and reactive to light.  Neck:     Thyroid: No thyromegaly.     Vascular: No carotid bruit.  Cardiovascular:     Rate and Rhythm: Normal rate.     Heart sounds: Normal heart sounds.  Pulmonary:     Effort: Pulmonary effort is normal.     Breath sounds: Normal breath sounds.  Musculoskeletal:     Cervical back: Neck supple.  Lymphadenopathy:     Cervical: No cervical adenopathy.  Skin:     General: Skin is warm and dry.  Neurological:     Mental Status: He is alert and oriented to person, place, and time.     Results for orders placed or performed in visit on 11/06/22  CBC with Differential/Platelet  Result Value Ref Range   WBC 13.4 (H) 3.8 - 10.8 Thousand/uL   RBC 5.52 4.20 - 5.80 Million/uL   Hemoglobin 17.3 (H) 13.2 - 17.1 g/dL   HCT 50.7 (H) 38.5 - 50.0 %   MCV 91.8 80.0 - 100.0 fL   MCH 31.3 27.0 - 33.0 pg   MCHC 34.1 32.0 - 36.0 g/dL   RDW 11.3 11.0 - 15.0 %   Platelets 368 140 - 400 Thousand/uL   MPV 10.1 7.5 - 12.5 fL   Neutro Abs 10,184 (H) 1,500 - 7,800 cells/uL   Lymphs Abs 2,157 850 - 3,900 cells/uL   Absolute Monocytes 898 200 - 950 cells/uL   Eosinophils Absolute 94 15 - 500 cells/uL   Basophils Absolute 67 0 - 200 cells/uL   Neutrophils Relative % 76 %   Total Lymphocyte 16.1 %  Monocytes Relative 6.7 %   Eosinophils Relative 0.7 %   Basophils Relative 0.5 %  COMPLETE METABOLIC PANEL WITH GFR  Result Value Ref Range   Glucose, Bld 157 (H) 65 - 99 mg/dL   BUN 17 7 - 25 mg/dL   Creat 1.06 0.70 - 1.30 mg/dL   eGFR 84 > OR = 60 mL/min/1.26m2   BUN/Creatinine Ratio SEE NOTE: 6 - 22 (calc)   Sodium 142 135 - 146 mmol/L   Potassium 5.3 3.5 - 5.3 mmol/L   Chloride 103 98 - 110 mmol/L   CO2 29 20 - 32 mmol/L   Calcium 9.9 8.6 - 10.3 mg/dL   Total Protein 7.7 6.1 - 8.1 g/dL   Albumin 4.7 3.6 - 5.1 g/dL   Globulin 3.0 1.9 - 3.7 g/dL (calc)   AG Ratio 1.6 1.0 - 2.5 (calc)   Total Bilirubin 0.6 0.2 - 1.2 mg/dL   Alkaline phosphatase (APISO) 55 35 - 144 U/L   AST 20 10 - 35 U/L   ALT 32 9 - 46 U/L  Lipase  Result Value Ref Range   Lipase 31 7 - 60 U/L  POCT glucose (manual entry)  Result Value Ref Range   POC Glucose 159 (A) 70 - 99 mg/dl        Assessment & Plan:   Problem List Items Addressed This Visit       Cardiovascular and Mediastinum   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[place])   Other Visit Diagnoses     Blurry vision, left eye    -   Primary   Relevant Orders   POCT glucose (manual entry) (Completed)   CBC with Differential/Platelet (Completed)   COMPLETE METABOLIC PANEL WITH GFR (Completed)   Lipase (Completed)   Bilateral headache       Relevant Orders   CBC with Differential/Platelet (Completed)   COMPLETE METABOLIC PANEL WITH GFR (Completed)   Lipase (Completed)   Nausea       Relevant Orders   CBC with Differential/Platelet (Completed)   COMPLETE METABOLIC PANEL WITH GFR (Completed)   Lipase (Completed)   Chest tightness       Relevant Orders   EKG 12-Lead      Blurry vision left eye - has resolved.  No longer blurry  Bilat HA -blood pressure is up today he was actually just here recently and blood pressure looks fantastic.  I think some of this could be related to the pain.  Nausea -started the [facility] a week ago in fact he is due for another injection today.  But he is felt fine up until this afternoon.  He has not picked up the oral Lamisil yet and has not started it so that should not be causing any of the symptoms.  EKG today shows rate of 63 bpm, normal sinus rhythm with no acute ST-T wave changes.  Will get some labs today just to rule out any elevation in white count etc. check liver enzymes and lipase since he also has some nausea.  We did discuss that if the headache gets worse or starts having chest pain to get evaluated overnight if needed.  Otherwise we should have some the labs back in the morning.  The meantime just encourage him to go home hydrate and try to get some rest.  Meds ordered this encounter  Medications   ondansetron (ZOFRAN) 4 MG tablet    Sig: Take 1 tablet (4 mg total) by mouth every 8 (eight) hours as needed for nausea or  vomiting.    Dispense:  15 tablet    Refill:  0    No follow-ups on file.  Beatrice Lecher, MD

## 2022-11-06 NOTE — Telephone Encounter (Signed)
Patient walked in asking to have his blood pressure checked. He states around 11 am he started having some blurry vision in his right eye. It went away quickly, but after eating lunch developed headache and nausea. He has taken some aleve and headache is a little better.   Checked his blood pressure. 158/81, heart rate 66. He wanted to make sure he wasn't "stroking out" he states he just "feels funny". I advised him to be seen at urgent care based on symptoms. He will go now. FYI.

## 2022-11-06 NOTE — Progress Notes (Signed)
Patient walked in asking to have his blood pressure checked. He states around 11 am he started having some blurry vision in his right eye. It went away quickly, but after eating lunch developed headache and nausea. He has taken some aleve and headache is a little better.    Checked his blood pressure. 158/81, heart rate 66. He wanted to make sure he wasn't "stroking out" he states he just "feels funny". I advised him to be seen at urgent care based on symptoms. He will go now. FYI.

## 2022-11-07 ENCOUNTER — Encounter: Payer: Self-pay | Admitting: Family Medicine

## 2022-11-07 LAB — COMPLETE METABOLIC PANEL WITH GFR
AG Ratio: 1.6 (calc) (ref 1.0–2.5)
ALT: 32 U/L (ref 9–46)
AST: 20 U/L (ref 10–35)
Albumin: 4.7 g/dL (ref 3.6–5.1)
Alkaline phosphatase (APISO): 55 U/L (ref 35–144)
BUN: 17 mg/dL (ref 7–25)
CO2: 29 mmol/L (ref 20–32)
Calcium: 9.9 mg/dL (ref 8.6–10.3)
Chloride: 103 mmol/L (ref 98–110)
Creat: 1.06 mg/dL (ref 0.70–1.30)
Globulin: 3 g/dL (calc) (ref 1.9–3.7)
Glucose, Bld: 157 mg/dL — ABNORMAL HIGH (ref 65–99)
Potassium: 5.3 mmol/L (ref 3.5–5.3)
Sodium: 142 mmol/L (ref 135–146)
Total Bilirubin: 0.6 mg/dL (ref 0.2–1.2)
Total Protein: 7.7 g/dL (ref 6.1–8.1)
eGFR: 84 mL/min/{1.73_m2} (ref >=60)

## 2022-11-07 LAB — CBC WITH DIFFERENTIAL/PLATELET
Absolute Monocytes: 898 cells/uL (ref 200–950)
Basophils Absolute: 67 cells/uL (ref 0–200)
Basophils Relative: 0.5 %
Eosinophils Absolute: 94 cells/uL (ref 15–500)
Eosinophils Relative: 0.7 %
HCT: 50.7 % — ABNORMAL HIGH (ref 38.5–50.0)
Hemoglobin: 17.3 g/dL — ABNORMAL HIGH (ref 13.2–17.1)
Lymphs Abs: 2157 cells/uL (ref 850–3900)
MCH: 31.3 pg (ref 27.0–33.0)
MCHC: 34.1 g/dL (ref 32.0–36.0)
MCV: 91.8 fL (ref 80.0–100.0)
MPV: 10.1 fL (ref 7.5–12.5)
Monocytes Relative: 6.7 %
Neutro Abs: 10184 cells/uL — ABNORMAL HIGH (ref 1500–7800)
Neutrophils Relative %: 76 %
Platelets: 368 10*3/uL (ref 140–400)
RBC: 5.52 10*6/uL (ref 4.20–5.80)
RDW: 11.3 % (ref 11.0–15.0)
Total Lymphocyte: 16.1 %
WBC: 13.4 10*3/uL — ABNORMAL HIGH (ref 3.8–10.8)

## 2022-11-07 LAB — LIPASE: Lipase: 31 U/L (ref 7–60)

## 2022-11-07 NOTE — Progress Notes (Signed)
Hi Sarge, hemoglobin is still a little on the higher end but it is stable.  White blood cells are just a little elevated similar to what we would see with a virus.  Hopefully you are feeling a little better today but it could be that you are coming down with a virus.  Liver and pancreas look normal no sign of inflammation and no electrolyte abnormality.

## 2022-11-17 ENCOUNTER — Other Ambulatory Visit: Payer: Self-pay | Admitting: Family Medicine

## 2022-11-17 DIAGNOSIS — F902 Attention-deficit hyperactivity disorder, combined type: Secondary | ICD-10-CM

## 2022-11-17 DIAGNOSIS — E119 Type 2 diabetes mellitus without complications: Secondary | ICD-10-CM | POA: Diagnosis not present

## 2022-11-17 MED ORDER — AMPHETAMINE-DEXTROAMPHET ER 30 MG PO CP24: 30.0000 mg | ORAL_CAPSULE | ORAL | 0 refills | Status: DC

## 2022-11-19 ENCOUNTER — Encounter: Payer: Self-pay | Admitting: Family Medicine

## 2022-11-19 DIAGNOSIS — J4550 Severe persistent asthma, uncomplicated: Secondary | ICD-10-CM

## 2022-11-20 MED ORDER — ALBUTEROL SULFATE HFA 108 (90 BASE) MCG/ACT IN AERS: 2.0000 | INHALATION_SPRAY | RESPIRATORY_TRACT | 1 refills | Status: DC | PRN

## 2022-12-15 ENCOUNTER — Ambulatory Visit (AMBULATORY_SURGERY_CENTER): Payer: BC Managed Care – PPO | Admitting: *Deleted

## 2022-12-15 ENCOUNTER — Encounter: Payer: Self-pay | Admitting: Gastroenterology

## 2022-12-15 VITALS — Ht 67.0 in | Wt 235.0 lb

## 2022-12-15 DIAGNOSIS — Z1211 Encounter for screening for malignant neoplasm of colon: Secondary | ICD-10-CM

## 2022-12-15 MED ORDER — NA SULFATE-K SULFATE-MG SULF 17.5-3.13-1.6 GM/177ML PO SOLN: 1.0000 | Freq: Once | ORAL | 0 refills | Status: AC

## 2022-12-15 NOTE — Progress Notes (Signed)
Pt's name and DOB verified at the beginning of the pre-visit.  Pt denies any difficulty with ambulating.  No egg or soy allergy known to patient  No issues known to pt with past sedation with any surgeries or procedures Pt denies having issues being intubated Pt has no issues moving head neck or swallowing esophageal expansion hx No FH of Malignant Hyperthermia Pt is not on diet pills Pt is not on home 02  Pt is not on blood thinners  Pt denies issues with constipation  Pt is not on dialysis Pt denies any upcoming cardiac testing Pt encouraged to use to use Singlecare or Goodrx to reduce cost  Patient's chart reviewed by Cathlyn Parsons CNRA prior to pre-visit and patient appropriate for the [facility].  Pre-visit completed and red dot placed by patient's name on their procedure day (on provider's schedule).  . Visit in person Pt states weight is 235 lb Instructed pt why it is important to and  to call if they have any changes in health or new medications. Directed them to the # given and on instructions.   Pt states they will.  Instructions reviewed with pt and pt states understanding. Instructed to review again prior to procedure. Pt states they will.  Instructions given to ptl with coupon and by my chart

## 2022-12-17 ENCOUNTER — Encounter: Payer: Self-pay | Admitting: Pulmonary Disease

## 2022-12-17 DIAGNOSIS — E119 Type 2 diabetes mellitus without complications: Secondary | ICD-10-CM | POA: Diagnosis not present

## 2022-12-18 NOTE — Telephone Encounter (Signed)
Pharmacy team, please advise on pt's message regarding his Nucala. Thanks.

## 2022-12-23 MED ORDER — TRELEGY ELLIPTA 200-62.5-25 MCG/ACT IN AEPB: INHALATION_SPRAY | RESPIRATORY_TRACT | 1 refills | Status: DC

## 2022-12-24 ENCOUNTER — Encounter: Payer: Self-pay | Admitting: Certified Registered Nurse Anesthetist

## 2022-12-29 ENCOUNTER — Ambulatory Visit (AMBULATORY_SURGERY_CENTER): Payer: BC Managed Care – PPO | Admitting: Gastroenterology

## 2022-12-29 ENCOUNTER — Encounter: Payer: Self-pay | Admitting: Gastroenterology

## 2022-12-29 ENCOUNTER — Encounter: Payer: Self-pay | Admitting: Family Medicine

## 2022-12-29 VITALS — BP 129/81 | HR 63 | Temp 98.9°F | Resp 13 | Ht 67.0 in | Wt 235.0 lb

## 2022-12-29 DIAGNOSIS — K5730 Diverticulosis of large intestine without perforation or abscess without bleeding: Secondary | ICD-10-CM

## 2022-12-29 DIAGNOSIS — Z1211 Encounter for screening for malignant neoplasm of colon: Secondary | ICD-10-CM

## 2022-12-29 DIAGNOSIS — D123 Benign neoplasm of transverse colon: Secondary | ICD-10-CM | POA: Diagnosis not present

## 2022-12-29 DIAGNOSIS — D128 Benign neoplasm of rectum: Secondary | ICD-10-CM | POA: Diagnosis not present

## 2022-12-29 DIAGNOSIS — D120 Benign neoplasm of cecum: Secondary | ICD-10-CM

## 2022-12-29 DIAGNOSIS — D125 Benign neoplasm of sigmoid colon: Secondary | ICD-10-CM | POA: Diagnosis not present

## 2022-12-29 DIAGNOSIS — D124 Benign neoplasm of descending colon: Secondary | ICD-10-CM | POA: Diagnosis not present

## 2022-12-29 DIAGNOSIS — K621 Rectal polyp: Secondary | ICD-10-CM

## 2022-12-29 DIAGNOSIS — K640 First degree hemorrhoids: Secondary | ICD-10-CM

## 2022-12-29 MED ORDER — SODIUM CHLORIDE 0.9 % IV SOLN: 500.0000 mL | INTRAVENOUS | Status: DC

## 2022-12-29 NOTE — Patient Instructions (Signed)
YOU HAD AN ENDOSCOPIC PROCEDURE TODAY AT THE [facility]:   Refer to the procedure report that was given to you for any specific questions about what was found during the examination.  If the procedure report does not answer your questions, please call your gastroenterologist to clarify.  If you requested that your care partner not be given the details of your procedure findings, then the procedure report has been included in a sealed envelope for you to review at your convenience later.  **Handouts given on polyps, hemorrhoids and diverticulosis**  YOU SHOULD EXPECT: Some feelings of bloating in the abdomen. Passage of more gas than usual.  Walking can help get rid of the air that was put into your GI tract during the procedure and reduce the bloating. If you had a lower endoscopy (such as a colonoscopy or flexible sigmoidoscopy) you may notice spotting of blood in your stool or on the toilet paper. If you underwent a bowel prep for your procedure, you may not have a normal bowel movement for a few days.  Please Note:  You might notice some irritation and congestion in your nose or some drainage.  This is from the oxygen used during your procedure.  There is no need for concern and it should clear up in a day or so.  SYMPTOMS TO REPORT IMMEDIATELY:  Following lower endoscopy (colonoscopy or flexible sigmoidoscopy):  Excessive amounts of blood in the stool  Significant tenderness or worsening of abdominal pains  Swelling of the abdomen that is new, acute  Fever of 100F or higher  For urgent or emergent issues, a gastroenterologist can be reached at any hour by calling [phone]. Do not use MyChart messaging for urgent concerns.    DIET:  We do recommend a small meal at first, but then you may proceed to your regular diet.  Drink plenty of fluids but you should avoid alcoholic beverages for 24 hours.  ACTIVITY:  You should plan to take it easy for the rest of today and you  should NOT DRIVE or use heavy machinery until tomorrow (because of the sedation medicines used during the test).    FOLLOW UP: Our staff will call the number listed on your records the next business day following your procedure.  We will call around 7:15- 8:00 am to check on you and address any questions or concerns that you may have regarding the information given to you following your procedure. If we do not reach you, we will leave a message.     If any biopsies were taken you will be contacted by phone or by letter within the next 1-3 weeks.  Please call us at [phone] if you have not heard about the biopsies in 3 weeks.    SIGNATURES/CONFIDENTIALITY: You and/or your care partner have signed paperwork which will be entered into your electronic medical record.  These signatures attest to the fact that that the information above on your After Visit Summary has been reviewed and is understood.  Full responsibility of the confidentiality of this discharge information lies with you and/or your care-partner. 

## 2022-12-29 NOTE — Op Note (Signed)
[facility] Patient Name: Christian Gregory Procedure Date: 12/29/2022 8:59 AM MRN: [id number] Endoscopist: Doristine Locks , MD, [id number] Age: 52 Referring MD:  Date of Birth: Apr 18, 1971 Gender: Male Account #: [id number] Procedure:                Colonoscopy Indications:              Screening for colorectal malignant neoplasm, This                            is the patient's first colonoscopy Medicines:                Monitored Anesthesia Care Procedure:                Pre-Anesthesia Assessment:                           - Prior to the procedure, a History and Physical                            was performed, and patient medications and                            allergies were reviewed. The patient's tolerance of                            previous anesthesia was also reviewed. The risks                            and benefits of the procedure and the sedation                            options and risks were discussed with the patient.                            All questions were answered, and informed consent                            was obtained. Prior Anticoagulants: The patient has                            taken no anticoagulant or antiplatelet agents. ASA                            Grade Assessment: II - A patient with mild systemic                            disease. After reviewing the risks and benefits,                            the patient was deemed in satisfactory condition to                            undergo the procedure.  After obtaining informed consent, the colonoscope                            was passed under direct vision. Throughout the                            procedure, the patient's blood pressure, pulse, and                            oxygen saturations were monitored continuously. The                            Olympus CF-HQ190L [id number]) Colonoscope was                            introduced through the anus and  advanced to the the                            terminal ileum. The colonoscopy was performed                            without difficulty. The patient tolerated the                            procedure well. The quality of the bowel                            preparation was good. The terminal ileum, ileocecal                            valve, appendiceal orifice, and rectum were                            photographed. Scope In: 9:05:04 AM Scope Out: 9:30:31 AM Scope Withdrawal Time: 0 hours 23 minutes 38 seconds  Total Procedure Duration: 0 hours 25 minutes 27 seconds  Findings:                 The perianal and digital rectal examinations were                            normal.                           Three sessile polyps were found in the cecum. The                            polyps were 3 to 6 mm in size. These polyps were                            removed with a cold snare. Resection and retrieval                            were complete. Estimated blood loss was minimal.  Seven sessile polyps were found in the sigmoid                            colon (2), descending colon (4), and transverse                            colon (1). The polyps were 2 to 6 mm in size. These                            polyps were removed with a cold snare. Resection                            and retrieval were complete. Estimated blood loss                            was minimal.                           Three sessile polyps were found in the rectum. The                            polyps were 1 to 3 mm in size. These polyps were                            removed with a cold snare. Resection and retrieval                            were complete. Estimated blood loss was minimal.                           A few small-mouthed diverticula were found in the                            sigmoid colon.                           Non-bleeding internal hemorrhoids were found during                             retroflexion. The hemorrhoids were small.                           The terminal ileum appeared normal. Complications:            No immediate complications. Estimated Blood Loss:     Estimated blood loss was minimal. Impression:               - Three 3 to 6 mm polyps in the cecum, removed with                            a cold snare. Resected and retrieved.                           - Seven 2 to 6 mm polyps  in the sigmoid colon, in                            the descending colon and in the transverse colon,                            removed with a cold snare. Resected and retrieved.                           - Three 1 to 3 mm polyps in the rectum, removed                            with a cold snare. Resected and retrieved.                           - Diverticulosis in the sigmoid colon.                           - Non-bleeding internal hemorrhoids.                           - The examined portion of the ileum was normal.                           - The GI Genius (intelligent endoscopy module),                            computer-aided polyp detection system powered by AI                            was utilized to detect colorectal polyps through                            enhanced visualization during colonoscopy. Recommendation:           - Patient has a contact number available for                            emergencies. The signs and symptoms of potential                            delayed complications were discussed with the                            patient. Return to normal activities tomorrow.                            Written discharge instructions were provided to the                            patient.                           - Resume previous diet.                           -  Continue present medications.                           - Await pathology results.                           - Repeat colonoscopy for surveillance based on                             pathology results.                           - Return to GI clinic PRN. Doristine Locks, MD 12/29/2022 9:38:00 AM

## 2022-12-29 NOTE — Progress Notes (Signed)
GASTROENTEROLOGY PROCEDURE H&P NOTE   Primary Care Physician: Agapito Games, MD    Reason for Procedure:  Colon Cancer screening  Plan:    Colonoscopy  Patient is appropriate for endoscopic procedure(s) in the ambulatory ([facility]) setting.  The nature of the procedure, as well as the risks, benefits, and alternatives were carefully and thoroughly reviewed with the patient. Ample time for discussion and questions allowed. The patient understood, was satisfied, and agreed to proceed.     HPI: Christian Gregory is a 52 y.o. male who presents for colonoscopy for routine Colon Cancer screening.  No active GI symptoms.  No known family history of colon cancer or related malignancy.  Patient is otherwise without complaints or active issues today.  Past Medical History:  Diagnosis Date   Anxiety    anger problems   Asthma    Chronic kidney disease    Diabetes mellitus without complication (HCC)    prediabetic   Eosinophilic esophagitis    Fatty liver    History of methicillin resistant staphylococcus aureus (MRSA)    Hyperlipidemia    Hypertension    Obesity     Past Surgical History:  Procedure Laterality Date   BACK SURGERY     2019   ESOPHAGEAL DILATION     SHOULDER SURGERY     2016   steel plate      R ankle 1994-1995    Prior to Admission medications   Medication Sig Start Date End Date Taking? Authorizing Provider  albuterol (VENTOLIN HFA) 108 (90 Base) MCG/ACT inhaler Inhale 2 puffs into the lungs every 4 (four) hours as needed for wheezing or shortness of breath. 2 puffs as needed daily 11/20/22   Agapito Games, MD  ALPRAZolam Prudy Feeler) 0.5 MG tablet Take 1 tablet (0.5 mg total) by mouth at bedtime as needed. 12/23/21   Agapito Games, MD  amphetamine-dextroamphetamine (ADDERALL XR) 30 MG 24 hr capsule Take 1 capsule (30 mg total) by mouth every morning. 11/17/22   Agapito Games, MD  EPINEPHrine 0.3 mg/0.3 mL IJ SOAJ injection Inject 0.3 mg into  the muscle as needed for anaphylaxis. 12/11/20   Hunsucker, Lesia Sago, MD  Insulin Pen Needle 32G X 4 MM MISC Use to inject into the skin daily 02/10/19   Agapito Games, MD  ipratropium (ATROVENT) 0.06 % nasal spray Place 2 sprays into both nostrils 4 (four) times daily. 05/20/22   Kozlow, Alvira Philips, MD  Mepolizumab (NUCALA) 100 MG/ML SOAJ INJECT THE CONTENTS OF 1 PEN (100 MG) UNDER THE SKIN EVERY 4 WEEKS 05/23/22   Hunsucker, Lesia Sago, MD  metoprolol succinate (TOPROL-XL) 50 MG 24 hr tablet TAKE 1 TABLET BY MOUTH EVERY DAY 10/13/22   Agapito Games, MD  olmesartan (BENICAR) 5 MG tablet Take 1 tablet (5 mg total) by mouth daily. 07/28/22   Agapito Games, MD  Olopatadine HCl (PATADAY) 0.2 % SOLN Place 1 drop into both eyes 1 day or 1 dose. 05/20/22   Kozlow, Alvira Philips, MD  Olopatadine-Mometasone [facility]) [phone] MCG/ACT SUSP Place 2 sprays into the nose in the morning and at bedtime. 05/20/22   Kozlow, Alvira Philips, MD  ondansetron (ZOFRAN) 4 MG tablet Take 1 tablet (4 mg total) by mouth every 8 (eight) hours as needed for nausea or vomiting. Patient not taking: Reported on 12/15/2022 11/06/22   Agapito Games, MD  terbinafine (LAMISIL) 250 MG tablet Take 1 tablet (250 mg total) by mouth daily. 11/05/22  Agapito Games, MD  testosterone cypionate (DEPOTESTOSTERONE CYPIONATE) 200 MG/ML injection INJECT 1 ML (200 MG TOTAL) INTO THE MUSCLE EVERY 14 DAYS 10/20/22   Agapito Games, MD  tirzepatide [facility]) 2.5 MG/0.5ML Pen Inject 2.5 mg into the skin once a week. Patient not taking: Reported on 12/15/2022 10/28/22   Agapito Games, MD  tirzepatide [facility]) 5 MG/0.5ML Pen Inject 5 mg into the skin once a week. 11/18/22   Agapito Games, MD  TRELEGY Salomon Mast 200-62.5-25 MCG/ACT AEPB TAKE 1 PUFF BY MOUTH EVERY DAY 12/23/22   Hunsucker, Lesia Sago, MD    Current Outpatient Medications  Medication Sig Dispense Refill   albuterol (VENTOLIN HFA) 108 (90 Base) MCG/ACT inhaler  Inhale 2 puffs into the lungs every 4 (four) hours as needed for wheezing or shortness of breath. 2 puffs as needed daily 8.5 each 1   ALPRAZolam (XANAX) 0.5 MG tablet Take 1 tablet (0.5 mg total) by mouth at bedtime as needed. 20 tablet 0   amphetamine-dextroamphetamine (ADDERALL XR) 30 MG 24 hr capsule Take 1 capsule (30 mg total) by mouth every morning. 90 capsule 0   EPINEPHrine 0.3 mg/0.3 mL IJ SOAJ injection Inject 0.3 mg into the muscle as needed for anaphylaxis. 1 each 2   Insulin Pen Needle 32G X 4 MM MISC Use to inject into the skin daily 100 each prn   ipratropium (ATROVENT) 0.06 % nasal spray Place 2 sprays into both nostrils 4 (four) times daily. 15 mL 5   Mepolizumab (NUCALA) 100 MG/ML SOAJ INJECT THE CONTENTS OF 1 PEN (100 MG) UNDER THE SKIN EVERY 4 WEEKS 1 mL 4   metoprolol succinate (TOPROL-XL) 50 MG 24 hr tablet TAKE 1 TABLET BY MOUTH EVERY DAY 90 tablet 1   olmesartan (BENICAR) 5 MG tablet Take 1 tablet (5 mg total) by mouth daily. 90 tablet 1   Olopatadine HCl (PATADAY) 0.2 % SOLN Place 1 drop into both eyes 1 day or 1 dose. 2.5 mL 5   Olopatadine-Mometasone (RYALTRIS) 665-25 MCG/ACT SUSP Place 2 sprays into the nose in the morning and at bedtime. 29 g 5   ondansetron (ZOFRAN) 4 MG tablet Take 1 tablet (4 mg total) by mouth every 8 (eight) hours as needed for nausea or vomiting. (Patient not taking: Reported on 12/15/2022) 15 tablet 0   terbinafine (LAMISIL) 250 MG tablet Take 1 tablet (250 mg total) by mouth daily. 90 tablet 1   testosterone cypionate (DEPOTESTOSTERONE CYPIONATE) 200 MG/ML injection INJECT 1 ML (200 MG TOTAL) INTO THE MUSCLE EVERY 14 DAYS 6 mL 0   tirzepatide (MOUNJARO) 2.5 MG/0.5ML Pen Inject 2.5 mg into the skin once a week. (Patient not taking: Reported on 12/15/2022) 2 mL 0   tirzepatide (MOUNJARO) 5 MG/0.5ML Pen Inject 5 mg into the skin once a week. 2 mL 0   TRELEGY ELLIPTA 200-62.5-25 MCG/ACT AEPB TAKE 1 PUFF BY MOUTH EVERY DAY 180 each 1   No current  facility-administered medications for this visit.    Allergies as of 12/29/2022 - Review Complete 12/24/2022  Allergen Reaction Noted   Penicillins Anaphylaxis and Swelling 03/10/2010   Losartan Other (See Comments) 05/28/2020   Lisinopril Diarrhea 04/30/2020    Family History  Problem Relation Age of Onset   Hypertension Mother        father   Prostate cancer Father    Stroke Father    Colon cancer Neg Hx    Colon polyps Neg Hx    Esophageal cancer Neg Hx  Rectal cancer Neg Hx    Stomach cancer Neg Hx     Social History   Socioeconomic History   Marital status: Married    Spouse name: Not on file   Number of children: Not on file   Years of education: Not on file   Highest education level: Not on file  Occupational History   Not on file  Tobacco Use   Smoking status: Never   Smokeless tobacco: Never  Substance and Sexual Activity   Alcohol use: Yes    Comment: occ   Drug use: No   Sexual activity: Yes  Other Topics Concern   Not on file  Social History Narrative   Not on file   Social Determinants of Health   Financial Resource Strain: Not on file  Food Insecurity: Not on file  Transportation Needs: Not on file  Physical Activity: Not on file  Stress: Not on file  Social Connections: Not on file  Intimate Partner Violence: Not on file    Physical Exam: Vital signs in last 24 hours: @There  were no vitals taken for this visit. GEN: NAD EYE: Sclerae anicteric ENT: MMM CV: Non-tachycardic Pulm: CTA b/l GI: Soft, NT/ND NEURO:  Alert & Oriented x 3   Doristine Locks, DO [facility]   12/29/2022 7:31 AM

## 2022-12-29 NOTE — Progress Notes (Signed)
Called to room to assist during endoscopic procedure.  Patient ID and intended procedure confirmed with present staff. Received instructions for my participation in the procedure from the performing physician.  

## 2022-12-29 NOTE — Progress Notes (Signed)
Report given to PACU, vss 

## 2022-12-30 ENCOUNTER — Telehealth: Payer: Self-pay

## 2022-12-30 MED ORDER — TIRZEPATIDE 7.5 MG/0.5ML ~~LOC~~ SOAJ: 7.5000 mg | SUBCUTANEOUS | 0 refills | Status: DC

## 2022-12-30 NOTE — Telephone Encounter (Signed)
Follow up call to pt, lm for pt to call if having any difficulty with normal activities or eating and drinking.  Also to call if any other questions or concerns.  

## 2023-01-06 ENCOUNTER — Other Ambulatory Visit: Payer: Self-pay

## 2023-01-06 DIAGNOSIS — D369 Benign neoplasm, unspecified site: Secondary | ICD-10-CM

## 2023-01-17 DIAGNOSIS — E119 Type 2 diabetes mellitus without complications: Secondary | ICD-10-CM | POA: Diagnosis not present

## 2023-01-20 ENCOUNTER — Other Ambulatory Visit: Payer: Self-pay | Admitting: Family Medicine

## 2023-01-20 DIAGNOSIS — I10 Essential (primary) hypertension: Secondary | ICD-10-CM

## 2023-02-12 ENCOUNTER — Encounter: Payer: Self-pay | Admitting: Family Medicine

## 2023-02-12 ENCOUNTER — Ambulatory Visit (INDEPENDENT_AMBULATORY_CARE_PROVIDER_SITE_OTHER): Payer: BC Managed Care – PPO | Admitting: Family Medicine

## 2023-02-12 VITALS — BP 133/73 | HR 65 | Ht 67.0 in | Wt 232.0 lb

## 2023-02-12 DIAGNOSIS — F902 Attention-deficit hyperactivity disorder, combined type: Secondary | ICD-10-CM | POA: Diagnosis not present

## 2023-02-12 DIAGNOSIS — D7218 Eosinophilia in diseases classified elsewhere: Secondary | ICD-10-CM

## 2023-02-12 DIAGNOSIS — E099 Drug or chemical induced diabetes mellitus without complications: Secondary | ICD-10-CM

## 2023-02-12 DIAGNOSIS — J4551 Severe persistent asthma with (acute) exacerbation: Secondary | ICD-10-CM

## 2023-02-12 DIAGNOSIS — R7301 Impaired fasting glucose: Secondary | ICD-10-CM

## 2023-02-12 DIAGNOSIS — I7789 Other specified disorders of arteries and arterioles: Secondary | ICD-10-CM

## 2023-02-12 DIAGNOSIS — B353 Tinea pedis: Secondary | ICD-10-CM

## 2023-02-12 LAB — POCT GLYCOSYLATED HEMOGLOBIN (HGB A1C): Hemoglobin A1C: 5.5 % (ref 4.0–5.6)

## 2023-02-12 MED ORDER — ALBUTEROL SULFATE HFA 108 (90 BASE) MCG/ACT IN AERS
2.0000 | INHALATION_SPRAY | Freq: Four times a day (QID) | RESPIRATORY_TRACT | 3 refills | Status: DC | PRN
Start: 2023-02-12 — End: 2023-05-22

## 2023-02-12 MED ORDER — ITRACONAZOLE 100 MG PO CAPS
100.0000 mg | ORAL_CAPSULE | Freq: Every day | ORAL | 0 refills | Status: AC
Start: 2023-02-12 — End: 2023-02-26

## 2023-02-12 MED ORDER — TIRZEPATIDE 10 MG/0.5ML ~~LOC~~ SOAJ
10.0000 mg | SUBCUTANEOUS | 1 refills | Status: DC
Start: 2023-02-12 — End: 2023-03-26

## 2023-02-12 MED ORDER — AMPHETAMINE-DEXTROAMPHET ER 30 MG PO CP24: 30.0000 mg | ORAL_CAPSULE | ORAL | 0 refills | Status: DC

## 2023-02-12 NOTE — Assessment & Plan Note (Signed)
Was previously on Nucala until about 2 months ago but unfortunately it is not being covered by the insurance plan.  He has reached out to them to ask if there is anything else that he could consider taking but he has not heard back from them and I am not aware of anything specific that would be equally as effective.

## 2023-02-12 NOTE — Assessment & Plan Note (Signed)
A1c looks great today at 5.5 which is down from previous of 7.3.  We discussed increasing Mounjaro.

## 2023-02-12 NOTE — Assessment & Plan Note (Signed)
Fill sent for 90-day.  Call if any problems or concerns.  Open 6 months.

## 2023-02-12 NOTE — Progress Notes (Signed)
Established Patient Office Visit  Subjective   Patient ID: Christian Gregory, male    DOB: Feb 07, 1971  Age: 52 y.o. MRN: [id number]  Chief Complaint  Patient presents with   Diabetes    HPI  Diabetes - no hypoglycemic events. No wounds or sores that are not healing well. No increased thirst or urination. Checking glucose at home. Taking medications as prescribed without any side effects.  ADD - Reports symptoms are well controlled on current regime. Denies any problems with insomnia, chest pain, palpitations, or SOB.    Pt wanted to know if Dr. Linford Arnold knew if there was an alternative to the [place] that he could take. He stated that he had reached out to the office that had previously gave this to him but he hasn't heard back from them.     Also he has been having issues w/athletes foot and using an OTC medication and this hasn't helped much would like to get something sent in for this.      ROS    Objective:     BP 133/73   Pulse 65   Ht 5\' 7"  (1.702 m)   Wt 232 lb (105.2 kg)   SpO2 97%   BMI 36.34 kg/m    Physical Exam   Results for orders placed or performed in visit on 02/12/23  POCT glycosylated hemoglobin (Hb A1C)  Result Value Ref Range   Hemoglobin A1C 5.5 4.0 - 5.6 %   HbA1c POC (<> result, manual entry)     HbA1c, POC (prediabetic range)     HbA1c, POC (controlled diabetic range)        The 10-year ASCVD risk score (Arnett DK, et al., 2019) is: 11.7%    Assessment & Plan:   Problem List Items Addressed This Visit       Cardiovascular and Mediastinum   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HCC)    Was previously on Nucala until about 2 months ago but unfortunately it is not being covered by the insurance plan.  He has reached out to them to ask if there is anything else that he could consider taking but he has not heard back from them and I am not aware of anything specific that would be equally as effective.        Respiratory   Asthma, allergic     Refilled her at his albuterol today.  He does not need a refill on Trelegy yet but I am happy to take that over.  He is still waiting to hear back from the allergy office.      Relevant Medications   albuterol (VENTOLIN HFA) 108 (90 Base) MCG/ACT inhaler     Endocrine   Steroid-induced diabetes (HCC)    A1c looks great today at 5.5 which is down from previous of 7.3.  We discussed increasing Mounjaro.      Relevant Medications   tirzepatide (MOUNJARO) 10 MG/0.5ML Pen   Other Relevant Orders   POCT glycosylated hemoglobin (Hb A1C) (Completed)     Other   Attention deficit hyperactivity disorder (ADHD), combined type - Primary    Fill sent for 90-day.  Call if any problems or concerns.  Open 6 months.      Relevant Medications   amphetamine-dextroamphetamine (ADDERALL XR) 30 MG 24 hr capsule   Other Visit Diagnoses     Tinea pedis of both feet       Relevant Medications   itraconazole (SPORANOX) 100 MG capsule  Tinea pedis-he still has some skin denudation and cracking between his toes.  We discussed ways to keep the feet dry and changing socks especially if they are wet or moist.  Etc.  Will treat with itraconazole for 2 weeks he will need to hold his terbinafine during that time and then when he is done he can restart the terbinafine.  Onychomycosis-he is getting good results so far with his toenails he has been on the terbinafine for 3 months so far he will likely need a 6 to 5-month treatment total.  Return in about 3 months (around 05/25/2023) for Diabetes follow-up.    Nani Gasser, MD

## 2023-02-12 NOTE — Progress Notes (Signed)
Pt wanted to know if Dr. Linford Arnold knew if there was an alternative to the [place] that he could take. He stated that he had reached out to the office that had previously gave this to him but he hasn't heard back from them.   Also he has been having issues w/athletes foot and using an OTC medication and this hasn't helped much would like to get something sent in for this.

## 2023-02-12 NOTE — Assessment & Plan Note (Signed)
Refilled her at his albuterol today.  He does not need a refill on Trelegy yet but I am happy to take that over.  He is still waiting to hear back from the allergy office.

## 2023-02-16 ENCOUNTER — Other Ambulatory Visit: Payer: Self-pay | Admitting: Family Medicine

## 2023-02-16 DIAGNOSIS — E119 Type 2 diabetes mellitus without complications: Secondary | ICD-10-CM | POA: Diagnosis not present

## 2023-02-23 ENCOUNTER — Inpatient Hospital Stay: Payer: BC Managed Care – PPO | Attending: Genetic Counselor | Admitting: Licensed Clinical Social Worker

## 2023-02-23 ENCOUNTER — Other Ambulatory Visit: Payer: Self-pay

## 2023-02-23 ENCOUNTER — Inpatient Hospital Stay: Payer: BC Managed Care – PPO

## 2023-02-23 DIAGNOSIS — Z86010 Personal history of colonic polyps: Secondary | ICD-10-CM | POA: Diagnosis not present

## 2023-02-23 DIAGNOSIS — Z808 Family history of malignant neoplasm of other organs or systems: Secondary | ICD-10-CM | POA: Diagnosis not present

## 2023-02-23 DIAGNOSIS — Z8042 Family history of malignant neoplasm of prostate: Secondary | ICD-10-CM

## 2023-02-24 LAB — GENETIC SCREENING ORDER

## 2023-02-24 NOTE — Progress Notes (Signed)
REFERRING PROVIDER: Shellia Cleverly, DO [address] [place],  [place] [address]  PRIMARY PROVIDER:  Agapito Games, MD  PRIMARY REASON FOR VISIT:  1. Personal history of colonic polyps   2. Family history of prostate cancer   3. Family history of brain cancer      HISTORY OF PRESENT ILLNESS:   Christian Gregory, a 52 y.o. male, was seen for a  cancer genetics consultation at the request of Dr. Barron Alvine due to a personal history of colon polyps.  Christian Gregory presents to clinic today to discuss the possibility of a hereditary predisposition to cancer, genetic testing, and to further clarify his future cancer risks, as well as potential cancer risks for family members.   CANCER HISTORY:  Christian Gregory is a 52 y.o. male with no personal history of cancer.    Past Medical History:  Diagnosis Date   Anxiety    anger problems   Asthma    Chronic kidney disease    Diabetes mellitus without complication (HCC)    prediabetic   Eosinophilic esophagitis    Fatty liver    History of methicillin resistant staphylococcus aureus (MRSA)    Hyperlipidemia    Hypertension    Obesity     Past Surgical History:  Procedure Laterality Date   BACK SURGERY     2019   ESOPHAGEAL DILATION     SHOULDER SURGERY     2016   steel plate      R ankle [phone]    FAMILY HISTORY:  We obtained a detailed, 4-generation family history.  Significant diagnoses are listed below: Family History  Problem Relation Age of Onset   Hypertension Mother        father   Prostate cancer Father    Stroke Father    Colon cancer Neg Hx    Colon polyps Neg Hx    Esophageal cancer Neg Hx    Rectal cancer Neg Hx    Stomach cancer Neg Hx    Christian Gregory has 1 son, 1 daughter, no cancers. He has 1 brother who passed at 75 and twin sisters who are 70, no cancers.  Christian Gregory mother died at 46. A maternal aunt had brain cancer/tumor and died at 63. No other known cancers on this side of the family.  Mr.  Gregory father had prostate cancer at 62 but did not pass from it. A paternal aunt had metastatic cancer and died in her 5s, unknown primary. Paternal grandparents passed in their 42s, unknown cause.  Christian Gregory is unaware of previous family history of genetic testing for hereditary cancer risks. There is no reported Ashkenazi Jewish ancestry. There is no known consanguinity.    GENETIC COUNSELING ASSESSMENT: Christian Gregory is a 52 y.o. male with a personal history of 10 adenomatous polyps which is somewhat suggestive of a hereditary polyposis/cancer syndrome and predisposition to cancer. We, therefore, discussed and recommended the following at today's visit.   DISCUSSION: We discussed that polyps in general are common, however, most people have fewer than 5 lifetime polyps.  When an individual has 10 or more polyps we become concerned about an underlying polyposis syndrome.  The most common hereditary polyposis syndromes are caused by problems in the APC and MUTYH genes, however, more recently, mutations in the NTHL1 and MSH3 genes have been identified in some polyposis families. We discussed that testing is beneficial for several reasons including knowing how to follow individuals for cancer screenings, and understand if  other family members could be at risk for cancer and allow them to undergo genetic testing.   We reviewed the characteristics, features and inheritance patterns of hereditary cancer syndromes. We also discussed genetic testing, including the appropriate family members to test, the process of testing, insurance coverage and turn-around-time for results. We discussed the implications of a negative, positive and/or variant of uncertain significant result. We recommended Christian Gregory pursue genetic testing for the [facility] Multi-Cancer+RNA gene panel.   Based on Christian Gregory personal history of colon polyps, he meets medical criteria for genetic testing. Despite that he meets criteria, he may still  have an out of pocket cost.   PLAN: After considering the risks, benefits, and limitations, Christian Gregory provided informed consent to pursue genetic testing and the blood sample was sent to [facility] for analysis of the Multi-Cancer+RNA panel. Results should be available within approximately 2-3 weeks' time, at which point they will be disclosed by telephone to Christian Gregory, as will any additional recommendations warranted by these results. Christian Gregory will receive a summary of his genetic counseling visit and a copy of his results once available. This information will also be available in Epic.   Christian Gregory questions were answered to his satisfaction today. Our contact information was provided should additional questions or concerns arise. Thank you for the referral and allowing [place] to share in the care of your patient.   Lacy Duverney, MS, [facility] Genetic Counselor [email or web address] Phone: [phone]  The patient was seen for a total of 30 minutes in face-to-face genetic counseling.  Dr. Orlie Dakin was available for discussion regarding this case.   _______________________________________________________________________ For Office Staff:  Number of people involved in session: 2 Was an Intern/ student involved with case: yes - [employer] intern Maddy Maisie Fus was present and assisted with this case.

## 2023-03-04 ENCOUNTER — Telehealth: Payer: Self-pay | Admitting: Licensed Clinical Social Worker

## 2023-03-04 ENCOUNTER — Encounter: Payer: Self-pay | Admitting: Licensed Clinical Social Worker

## 2023-03-04 ENCOUNTER — Ambulatory Visit: Payer: Self-pay | Admitting: Licensed Clinical Social Worker

## 2023-03-04 DIAGNOSIS — Z1379 Encounter for other screening for genetic and chromosomal anomalies: Secondary | ICD-10-CM

## 2023-03-04 NOTE — Telephone Encounter (Signed)
I contacted Mr. Milke to discuss his genetic testing results. No pathogenic variants were identified in the 70 genes analyzed. Detailed clinic note to follow.   The test report has been scanned into EPIC and is located under the Molecular Pathology section of the Results Review tab.  A portion of the result report is included below for reference.      Lacy Duverney, MS, [facility] Genetic Counselor [email or web address] Phone: [phone]

## 2023-03-04 NOTE — Progress Notes (Signed)
HPI:   Mr. Jaquay was previously seen in the [facility] due to a personal history of polyps and concerns regarding a hereditary predisposition to cancer. Please refer to our prior cancer genetics clinic note for more information regarding our discussion, assessment and recommendations, at the time. Mr. Geter recent genetic test results were disclosed to him, as were recommendations warranted by these results. These results and recommendations are discussed in more detail below.  CANCER HISTORY:  Oncology History   No history exists.    FAMILY HISTORY:  We obtained a detailed, 4-generation family history.  Significant diagnoses are listed below: Family History  Problem Relation Age of Onset   Hypertension Mother        father   Prostate cancer Father    Stroke Father    Colon cancer Neg Hx    Colon polyps Neg Hx    Esophageal cancer Neg Hx    Rectal cancer Neg Hx    Stomach cancer Neg Hx     Mr. Platz has 1 son, 1 daughter, no cancers. He has 1 brother who passed at 54 and twin sisters who are 72, no cancers.   Mr. Vest mother died at 69. A maternal aunt had brain cancer/tumor and died at 52. No other known cancers on this side of the family.   Mr. Kem father had prostate cancer at 37 but did not pass from it. A paternal aunt had metastatic cancer and died in her 8s, unknown primary. Paternal grandparents passed in their 27s, unknown cause.   Mr. Scala is unaware of previous family history of genetic testing for hereditary cancer risks. There is no reported Ashkenazi Jewish ancestry. There is no known consanguinity.     GENETIC TEST RESULTS:  The [facility] Multi-Cancer+RNA Panel found no pathogenic mutations.   The Multi-Cancer + RNA Panel offered by [facility] includes sequencing and/or deletion/duplication analysis of the following 70 genes:  AIP*, ALK, APC*, ATM*, AXIN2*, BAP1*, BARD1*, BLM*, BMPR1A*, BRCA1*, BRCA2*, BRIP1*, CDC73*, CDH1*, CDK4,  CDKN1B*, CDKN2A, CHEK2*, CTNNA1*, DICER1*, EPCAM, EGFR, FH*, FLCN*, GREM1, HOXB13, KIT, LZTR1, MAX*, MBD4, MEN1*, MET, MITF, MLH1*, MSH2*, MSH3*, MSH6*, MUTYH*, NF1*, NF2*, NTHL1*, PALB2*, PDGFRA, PMS2*, POLD1*, POLE*, POT1*, PRKAR1A*, PTCH1*, PTEN*, RAD51C*, RAD51D*, RB1*, RET, SDHA*, SDHAF2*, SDHB*, SDHC*, SDHD*, SMAD4*, SMARCA4*, SMARCB1*, SMARCE1*, STK11*, SUFU*, TMEM127*, TP53*, TSC1*, TSC2*, VHL*. RNA analysis is performed for * genes.   The test report has been scanned into EPIC and is located under the Molecular Pathology section of the Results Review tab.  A portion of the result report is included below for reference. Genetic testing reported out on 03/02/2023.      Even though a pathogenic variant was not identified, possible explanations for the cancer in the family may include: There may be no hereditary risk for cancer in the family. The cancers in Mr. Groeneveld and/or his family may be sporadic/familial or due to other genetic and environmental factors. There may be a gene mutation in one of these genes that current testing methods cannot detect but that chance is small. There could be another gene that has not yet been discovered, or that we have not yet tested, that is responsible for the cancer diagnoses in the family.  It is also possible there is a hereditary cause for the cancer in the family that Mr. Cross did not inherit.  Therefore, it is important to remain in touch with cancer genetics in the future so that we can continue to offer Mr.  Versteeg the most up to date genetic testing.   ADDITIONAL GENETIC TESTING:  We discussed with Mr. Nyborg that his genetic testing was fairly extensive.  If there are additional relevant genes identified to increase cancer risk that can be analyzed in the future, we would be happy to discuss and coordinate this testing at that time.    CANCER SCREENING RECOMMENDATIONS:  Mr. Hollenbaugh test result is considered negative (normal).  This means that we have  not identified a hereditary cause for his personal history of polyps/family history of cancer at this time.   An individual's cancer risk and medical management are not determined by genetic test results alone. Overall cancer risk assessment incorporates additional factors, including personal medical history, family history, and any available genetic information that may result in a personalized plan for cancer prevention and surveillance. Therefore, it is recommended he continue to follow the cancer management and screening guidelines provided by his primary healthcare provider.  Based on the reported personal and family history, specific cancer screenings for Mr. MONICO SUDDUTH and his family include:  Colon Cancer Screening: This negative genetic test simply tells [place] that we cannot yet define why Mr. Limbach has had an increased number of colorectal polyps. Mr. Macdonnell medical management and screening should be based on the prospect that he will likely form more colon polyps and should, therefore, undergo more frequent colonoscopy screening at intervals determined by his GI providers.  We also recommended that Mr. Kardell have an upper endoscopy periodically.  RECOMMENDATIONS FOR FAMILY MEMBERS:   Since he did not inherit a identifiable mutation in a cancer predisposition gene included on this panel, his children could not have inherited a known mutation from him in one of these genes. Individuals in this family might be at some increased risk of developing cancer, over the general population risk, due to the family history of cancer.  Individuals in the family should notify their providers of the family history of cancer. We recommend women in this family have a yearly mammogram beginning at age 68, or 59 years younger than the earliest onset of cancer, an annual clinical breast exam, and perform monthly breast self-exams.  Family members should have colonoscopies by at age 12, or earlier, as recommended  by their providers.  FOLLOW-UP:  Lastly, we discussed with Mr. Trombetta that cancer genetics is a rapidly advancing field and it is possible that new genetic tests will be appropriate for him and/or his family members in the future. We encouraged him to remain in contact with cancer genetics on an annual basis so we can update his personal and family histories and let him know of advances in cancer genetics that may benefit this family.   Our contact number was provided. Mr. Moll questions were answered to his satisfaction, and he knows he is welcome to call us at anytime with additional questions or concerns.    Lacy Duverney, MS, [facility] Genetic Counselor [email or web address] Phone: [phone]

## 2023-03-18 ENCOUNTER — Telehealth: Payer: Self-pay | Admitting: Pulmonary Disease

## 2023-03-18 NOTE — Telephone Encounter (Signed)
I called PT from Dr. Jodene Nam recall list to sched a follow up appt. He said he would not set an appt because "You guys never came thru with my Nucala so what good would it do?"   Please see chart notes and call PT to advise.

## 2023-03-18 NOTE — Telephone Encounter (Addendum)
Not sure what this means - patient had been advised to reach out to copay card company in regards to Nucala to assist with loading additional funds for copay card.  He had been advised to follow-up with [place] afterwards for an update and further assistance prn. This is documented in Mychart message  Patient's has severe asthma and should ideally be managed by pulmonology but will be his decision to follow-up.    Chesley Mires, PharmD, MPH, BCPS, CPP Clinical Pharmacist (Rheumatology and Pulmonology)

## 2023-03-19 DIAGNOSIS — E119 Type 2 diabetes mellitus without complications: Secondary | ICD-10-CM | POA: Diagnosis not present

## 2023-03-26 ENCOUNTER — Encounter: Payer: Self-pay | Admitting: Family Medicine

## 2023-03-26 DIAGNOSIS — E099 Drug or chemical induced diabetes mellitus without complications: Secondary | ICD-10-CM

## 2023-03-26 MED ORDER — TRELEGY ELLIPTA 200-62.5-25 MCG/ACT IN AEPB: INHALATION_SPRAY | RESPIRATORY_TRACT | 3 refills | Status: DC

## 2023-03-26 MED ORDER — TIRZEPATIDE 10 MG/0.5ML ~~LOC~~ SOAJ: 10.0000 mg | SUBCUTANEOUS | 1 refills | Status: DC

## 2023-03-26 NOTE — Telephone Encounter (Signed)
Meds ordered this encounter  Medications   TRELEGY ELLIPTA 200-62.5-25 MCG/ACT AEPB    Sig: TAKE 1 PUFF BY MOUTH EVERY DAY    Dispense:  180 each    Refill:  3   tirzepatide (MOUNJARO) 10 MG/0.5ML Pen    Sig: Inject 10 mg into the skin once a week.    Dispense:  6 mL    Refill:  1

## 2023-03-29 ENCOUNTER — Other Ambulatory Visit: Payer: Self-pay | Admitting: Family Medicine

## 2023-03-29 DIAGNOSIS — E291 Testicular hypofunction: Secondary | ICD-10-CM

## 2023-04-16 ENCOUNTER — Other Ambulatory Visit: Payer: Self-pay | Admitting: Family Medicine

## 2023-04-16 DIAGNOSIS — I10 Essential (primary) hypertension: Secondary | ICD-10-CM

## 2023-04-19 DIAGNOSIS — E119 Type 2 diabetes mellitus without complications: Secondary | ICD-10-CM | POA: Diagnosis not present

## 2023-05-15 ENCOUNTER — Other Ambulatory Visit: Payer: Self-pay | Admitting: Family Medicine

## 2023-05-15 DIAGNOSIS — F902 Attention-deficit hyperactivity disorder, combined type: Secondary | ICD-10-CM

## 2023-05-15 MED ORDER — AMPHETAMINE-DEXTROAMPHET ER 30 MG PO CP24
30.0000 mg | ORAL_CAPSULE | ORAL | 0 refills | Status: DC
Start: 2023-05-15 — End: 2023-08-14

## 2023-05-19 DIAGNOSIS — E119 Type 2 diabetes mellitus without complications: Secondary | ICD-10-CM | POA: Diagnosis not present

## 2023-05-22 ENCOUNTER — Encounter: Payer: Self-pay | Admitting: Family Medicine

## 2023-05-22 ENCOUNTER — Ambulatory Visit (INDEPENDENT_AMBULATORY_CARE_PROVIDER_SITE_OTHER): Payer: BC Managed Care – PPO | Admitting: Family Medicine

## 2023-05-22 VITALS — BP 117/62 | HR 73 | Ht 67.0 in | Wt 210.0 lb

## 2023-05-22 DIAGNOSIS — J4551 Severe persistent asthma with (acute) exacerbation: Secondary | ICD-10-CM | POA: Diagnosis not present

## 2023-05-22 DIAGNOSIS — I10 Essential (primary) hypertension: Secondary | ICD-10-CM | POA: Diagnosis not present

## 2023-05-22 DIAGNOSIS — E099 Drug or chemical induced diabetes mellitus without complications: Secondary | ICD-10-CM

## 2023-05-22 DIAGNOSIS — T380X5D Adverse effect of glucocorticoids and synthetic analogues, subsequent encounter: Secondary | ICD-10-CM | POA: Diagnosis not present

## 2023-05-22 DIAGNOSIS — J019 Acute sinusitis, unspecified: Secondary | ICD-10-CM

## 2023-05-22 DIAGNOSIS — R7301 Impaired fasting glucose: Secondary | ICD-10-CM

## 2023-05-22 DIAGNOSIS — B353 Tinea pedis: Secondary | ICD-10-CM | POA: Diagnosis not present

## 2023-05-22 DIAGNOSIS — F902 Attention-deficit hyperactivity disorder, combined type: Secondary | ICD-10-CM

## 2023-05-22 LAB — POCT GLYCOSYLATED HEMOGLOBIN (HGB A1C): Hemoglobin A1C: 5.3 % (ref 4.0–5.6)

## 2023-05-22 LAB — POCT UA - MICROALBUMIN
Creatinine, POC: 200 mg/dL
Microalbumin Ur, POC: 80 mg/L

## 2023-05-22 MED ORDER — ALBUTEROL SULFATE HFA 108 (90 BASE) MCG/ACT IN AERS: 2.0000 | INHALATION_SPRAY | Freq: Four times a day (QID) | RESPIRATORY_TRACT | 3 refills | Status: DC | PRN

## 2023-05-22 MED ORDER — DOXYCYCLINE HYCLATE 100 MG PO TABS
100.0000 mg | ORAL_TABLET | Freq: Two times a day (BID) | ORAL | 0 refills | Status: DC
Start: 2023-05-22 — End: 2023-06-19

## 2023-05-22 NOTE — Progress Notes (Signed)
Established Patient Office Visit  Subjective   Patient ID: Christian Gregory, male    DOB: 03-Oct-1970  Age: 52 y.o. MRN: [id number]  Chief Complaint  Patient presents with   ifg   Tinea Pedis    He reports that he took the medication but it didn't get any better.    Sinusitis    X4 days he stated that he did not go to work Tuesday and Wednesday     HPI   Diabetes - no hypoglycemic events. No wounds or sores that are not healing well. No increased thirst or urination. Checking glucose at home. Taking medications as prescribed without any side effects.  Hypertension- Pt denies chest pain, SOB, dizziness, or heart palpitations.  Taking meds as directed w/o problems.  Denies medication side effects.    Sinus sxs got worse about 4 days ago. Worse nasal congestion.  . No fever.   Still has a rash in between his toes on his left foot primarily between the fourth and fifth digit.  He did complete the Sporanox and says it did not really help.  He had previously been on Lamisil for 3 months prior to that.  He does feel like his nails look much better.    ROS    Objective:     BP 117/62   Pulse 73   Ht 5\' 7"  (1.702 m)   Wt 210 lb (95.3 kg)   SpO2 99%   BMI 32.89 kg/m    Physical Exam Constitutional:      Appearance: Normal appearance.  HENT:     Head: Normocephalic and atraumatic.     Right Ear: Tympanic membrane, ear canal and external ear normal. There is no impacted cerumen.     Left Ear: Tympanic membrane, ear canal and external ear normal. There is no impacted cerumen.     Nose: Nose normal.     Mouth/Throat:     Pharynx: Oropharynx is clear.  Eyes:     Conjunctiva/sclera: Conjunctivae normal.  Cardiovascular:     Rate and Rhythm: Normal rate and regular rhythm.  Pulmonary:     Effort: Pulmonary effort is normal.     Breath sounds: Normal breath sounds.  Musculoskeletal:     Cervical back: Neck supple. No tenderness.  Lymphadenopathy:     Cervical: No cervical  adenopathy.  Skin:    General: Skin is warm and dry.  Neurological:     Mental Status: He is alert and oriented to person, place, and time.  Psychiatric:        Mood and Affect: Mood normal.      Results for orders placed or performed in visit on 05/22/23  POCT HgB A1C  Result Value Ref Range   Hemoglobin A1C 5.3 4.0 - 5.6 %   HbA1c POC (<> result, manual entry)     HbA1c, POC (prediabetic range)     HbA1c, POC (controlled diabetic range)    POCT UA - Microalbumin  Result Value Ref Range   Microalbumin Ur, POC 80 mg/L   Creatinine, POC 200 mg/dL   Albumin/Creatinine Ratio, Urine, POC 30-300       The 10-year ASCVD risk score (Arnett DK, et al., 2019) is: 10.2%    Assessment & Plan:   Problem List Items Addressed This Visit       Cardiovascular and Mediastinum   Essential hypertension, benign    Well controlled. Continue current regimen. Follow up in  4 months. Ok to cut metoprolol in  half and monitor BP for a couple of weeks.          Respiratory   Asthma, allergic   Relevant Medications   albuterol (VENTOLIN HFA) 108 (90 Base) MCG/ACT inhaler     Endocrine   Steroid-induced diabetes (HCC)    A1C looks awedome. He is losing weight. Goal to get down to 180 lbs.        Relevant Orders   POCT HgB A1C (Completed)   POCT UA - Microalbumin (Completed)     Other   Attention deficit hyperactivity disorder (ADHD), combined type    Doing well on Adderall.       Other Visit Diagnoses     Impaired fasting glucose    -  Primary   Tinea pedis of both feet       Relevant Orders   Fungus Culture W/Rfx Rapid ID   Acute non-recurrent sinusitis, unspecified location       Relevant Medications   doxycycline (VIBRA-TABS) 100 MG tablet      Acute sinusitis  - Will go ahead and treat with doxycycline since he has a penicillin allergy call if not better in 1 week renal try to avoid steroids if we can but if needed we can call those in next week.  Tinea pedis-since  he has been on 2 treatments without significant improvement   No follow-ups on file.    Nani Gasser, MD

## 2023-05-22 NOTE — Assessment & Plan Note (Signed)
A1C looks awedome. He is losing weight. Goal to get down to 180 lbs.

## 2023-05-22 NOTE — Assessment & Plan Note (Addendum)
Well controlled. Continue current regimen. Follow up in  4 months. Ok to cut metoprolol in half and monitor BP for a couple of weeks.

## 2023-05-22 NOTE — Assessment & Plan Note (Signed)
Doing well on Adderall

## 2023-06-08 ENCOUNTER — Ambulatory Visit: Payer: BC Managed Care – PPO | Admitting: Family Medicine

## 2023-06-12 ENCOUNTER — Ambulatory Visit: Payer: BC Managed Care – PPO | Admitting: Family Medicine

## 2023-06-18 LAB — FUNGUS CULTURE W/RFX RAPID ID: Fungal Culture W/Rfx: POSITIVE — AB

## 2023-06-18 LAB — FUNGAL ID BY MOLECULAR METHODS

## 2023-06-19 ENCOUNTER — Other Ambulatory Visit: Payer: Self-pay | Admitting: Family Medicine

## 2023-06-19 DIAGNOSIS — E119 Type 2 diabetes mellitus without complications: Secondary | ICD-10-CM | POA: Diagnosis not present

## 2023-06-19 NOTE — Progress Notes (Signed)
Hi Sarge, the swab that we did on the skin did show a common type of fungus called Trichosporon mucoides, which typically responds really well to the terbinafine pills that I put you on so hopefully this will really help knock it down and out.

## 2023-07-19 DIAGNOSIS — E119 Type 2 diabetes mellitus without complications: Secondary | ICD-10-CM | POA: Diagnosis not present

## 2023-08-14 ENCOUNTER — Other Ambulatory Visit: Payer: Self-pay | Admitting: Family Medicine

## 2023-08-14 DIAGNOSIS — F902 Attention-deficit hyperactivity disorder, combined type: Secondary | ICD-10-CM

## 2023-08-16 MED ORDER — AMPHETAMINE-DEXTROAMPHET ER 30 MG PO CP24
30.0000 mg | ORAL_CAPSULE | ORAL | 0 refills | Status: DC
Start: 2023-08-16 — End: 2023-11-17

## 2023-08-19 DIAGNOSIS — E119 Type 2 diabetes mellitus without complications: Secondary | ICD-10-CM | POA: Diagnosis not present

## 2023-09-01 ENCOUNTER — Ambulatory Visit (INDEPENDENT_AMBULATORY_CARE_PROVIDER_SITE_OTHER): Payer: BC Managed Care – PPO | Admitting: Family Medicine

## 2023-09-01 VITALS — BP 154/80 | HR 74 | Ht 67.0 in | Wt 201.0 lb

## 2023-09-01 DIAGNOSIS — B351 Tinea unguium: Secondary | ICD-10-CM

## 2023-09-01 DIAGNOSIS — Z0000 Encounter for general adult medical examination without abnormal findings: Secondary | ICD-10-CM

## 2023-09-01 DIAGNOSIS — E099 Drug or chemical induced diabetes mellitus without complications: Secondary | ICD-10-CM | POA: Diagnosis not present

## 2023-09-01 DIAGNOSIS — I10 Essential (primary) hypertension: Secondary | ICD-10-CM

## 2023-09-01 DIAGNOSIS — E291 Testicular hypofunction: Secondary | ICD-10-CM

## 2023-09-01 DIAGNOSIS — T380X5D Adverse effect of glucocorticoids and synthetic analogues, subsequent encounter: Secondary | ICD-10-CM

## 2023-09-01 MED ORDER — TIRZEPATIDE 10 MG/0.5ML ~~LOC~~ SOAJ
10.0000 mg | SUBCUTANEOUS | 1 refills | Status: DC
Start: 2023-09-01 — End: 2024-02-17

## 2023-09-01 MED ORDER — OLMESARTAN MEDOXOMIL 5 MG PO TABS
5.0000 mg | ORAL_TABLET | Freq: Every day | ORAL | 1 refills | Status: DC
Start: 2023-09-01 — End: 2024-02-17

## 2023-09-01 MED ORDER — METOPROLOL SUCCINATE ER 50 MG PO TB24: 50.0000 mg | ORAL_TABLET | Freq: Every day | ORAL | 1 refills | Status: DC

## 2023-09-01 MED ORDER — TERBINAFINE HCL 250 MG PO TABS: 250.0000 mg | ORAL_TABLET | Freq: Every day | ORAL | 1 refills | Status: DC

## 2023-09-01 MED ORDER — TESTOSTERONE CYPIONATE 200 MG/ML IM SOLN
INTRAMUSCULAR | 0 refills | Status: DC
Start: 2023-09-01 — End: 2024-02-12

## 2023-09-01 NOTE — Progress Notes (Signed)
 Complete physical exam  Patient: Christian Gregory   DOB: 11/23/1970   52 y.o. Male  MRN: [id number]  Subjective:    Chief Complaint  Patient presents with   Annual Exam    Christian Gregory is a 53 y.o. male who presents today for a complete physical exam. He reports consuming a general diet. The patient does not participate in regular exercise at present. But is very active.  He generally feels well.  He does not have additional problems to discuss today.    Most recent fall risk assessment:    10/31/2022    4:13 PM  Fall Risk   Falls in the past year? 0  Number falls in past yr: 0  Injury with Fall? 0  Risk for fall due to : No Fall Risks  Follow up Falls evaluation completed     Most recent depression screenings:    10/31/2022    4:14 PM 10/28/2022    4:03 PM  PHQ 2/9 Scores  PHQ - 2 Score 0 0        Patient Care Team: Alvan Dorothyann BIRCH, MD as PCP - General (Family Medicine)   Outpatient Medications Prior to Visit  Medication Sig   albuterol  (VENTOLIN  HFA) 108 (90 Base) MCG/ACT inhaler Inhale 2 puffs into the lungs every 6 (six) hours as needed for wheezing or shortness of breath.   amphetamine -dextroamphetamine (ADDERALL XR) 30 MG 24 hr capsule Take 1 capsule (30 mg total) by mouth every morning.   ipratropium (ATROVENT ) 0.06 % nasal spray Place 2 sprays into both nostrils 4 (four) times daily.   TRELEGY ELLIPTA  200-62.5-25 MCG/ACT AEPB TAKE 1 PUFF BY MOUTH EVERY DAY   [DISCONTINUED] metoprolol  succinate (TOPROL -XL) 50 MG 24 hr tablet TAKE 1 TABLET BY MOUTH EVERY DAY   [DISCONTINUED] olmesartan  (BENICAR ) 5 MG tablet TAKE 1 TABLET (5 MG TOTAL) BY MOUTH DAILY.   [DISCONTINUED] testosterone  cypionate (DEPOTESTOSTERONE CYPIONATE) 200 MG/ML injection INJECT 1 ML (200 MG TOTAL) INTO THE MUSCLE EVERY 14 DAYS (Patient taking differently: Inject 200 mg into the muscle every 14 (fourteen) days. INJECT 1 ML (200 MG TOTAL) INTO THE MUSCLE EVERY 14 DAYS)   [DISCONTINUED]  tirzepatide  (MOUNJARO ) 10 MG/0.5ML Pen Inject 10 mg into the skin once a week.   [DISCONTINUED] terbinafine  (LAMISIL ) 250 MG tablet Take 1 tablet (250 mg total) by mouth daily.   No facility-administered medications prior to visit.    ROS        Objective:     BP (!) 154/80   Pulse 74   Ht 5' 7 (1.702 m)   Wt 201 lb (91.2 kg)   SpO2 99%   BMI 31.48 kg/m    Physical Exam Constitutional:      Appearance: Normal appearance.  HENT:     Head: Normocephalic and atraumatic.     Right Ear: Tympanic membrane, ear canal and external ear normal.     Left Ear: Tympanic membrane, ear canal and external ear normal.     Nose: Nose normal.     Mouth/Throat:     Pharynx: Oropharynx is clear.  Eyes:     Extraocular Movements: Extraocular movements intact.     Conjunctiva/sclera: Conjunctivae normal.     Pupils: Pupils are equal, round, and reactive to light.  Neck:     Thyroid: No thyromegaly.  Cardiovascular:     Rate and Rhythm: Normal rate and regular rhythm.  Pulmonary:     Effort: Pulmonary effort is normal.  Breath sounds: Normal breath sounds.  Abdominal:     General: Bowel sounds are normal.     Palpations: Abdomen is soft.     Tenderness: There is no abdominal tenderness.  Musculoskeletal:        General: No swelling.     Cervical back: Neck supple.  Skin:    General: Skin is warm and dry.  Neurological:     Mental Status: He is oriented to person, place, and time.  Psychiatric:        Mood and Affect: Mood normal.        Behavior: Behavior normal.      No results found for any visits on 09/01/23.     Assessment & Plan:    Routine Health Maintenance and Physical Exam  Immunization History  Administered Date(s) Administered   Influenza,inj,Quad PF,6+ Mos 07/07/2017   Influenza-Unspecified 06/01/2018   Tdap 07/20/2018    Health Maintenance  Topic Date Due   Pneumococcal Vaccine 74-37 Years old (1 of 2 - PCV) Never done   Zoster Vaccines-  Shingrix (1 of 2) Never done   Colonoscopy  12/29/2023   OPHTHALMOLOGY EXAM  10/31/2023 (Originally 02/21/1980)   Hepatitis C Screening  10/31/2023 (Originally 02/21/1988)   COVID-19 Vaccine (1) 11/13/2023 (Originally 02/21/1975)   INFLUENZA VACCINE  11/16/2023 (Originally 03/19/2023)   FOOT EXAM  10/31/2023   Diabetic kidney evaluation - eGFR measurement  11/06/2023   HEMOGLOBIN A1C  11/20/2023   Diabetic kidney evaluation - Urine ACR  05/21/2024   DTaP/Tdap/Td (2 - Td or Tdap) 07/20/2028   HIV Screening  Completed   HPV VACCINES  Aged Out    Discussed health benefits of physical activity, and encouraged him to engage in regular exercise appropriate for his age and condition.  Problem List Items Addressed This Visit       Cardiovascular and Mediastinum   Essential hypertension, benign   Relevant Medications   metoprolol  succinate (TOPROL -XL) 50 MG 24 hr tablet   olmesartan  (BENICAR ) 5 MG tablet     Endocrine   Steroid-induced diabetes (HCC)   Relevant Medications   olmesartan  (BENICAR ) 5 MG tablet   tirzepatide  (MOUNJARO ) 10 MG/0.5ML Pen   Hypogonadism male   Relevant Medications   testosterone  cypionate (DEPOTESTOSTERONE CYPIONATE) 200 MG/ML injection   Other Visit Diagnoses       Wellness examination    -  Primary   Relevant Orders   CMP14+EGFR   Lipid panel   CBC   Hemoglobin A1c     Onychomycosis of toenail       Relevant Medications   terbinafine  (LAMISIL ) 250 MG tablet       Keep up a regular exercise program and make sure you are eating a healthy diet Try to eat 4 servings of dairy a day, or if you are lactose intolerant take a calcium with vitamin D daily.  Your vaccines are up to date.   No follow-ups on file.     Dorothyann Byars, MD

## 2023-09-02 LAB — CMP14+EGFR
ALT: 22 [IU]/L (ref 0–44)
AST: 17 [IU]/L (ref 0–40)
Albumin: 4.4 g/dL (ref 3.8–4.9)
Alkaline Phosphatase: 68 [IU]/L (ref 44–121)
BUN/Creatinine Ratio: 18 (ref 9–20)
BUN: 19 mg/dL (ref 6–24)
Bilirubin Total: 0.4 mg/dL (ref 0.0–1.2)
CO2: 24 mmol/L (ref 20–29)
Calcium: 9.8 mg/dL (ref 8.7–10.2)
Chloride: 101 mmol/L (ref 96–106)
Creatinine, Ser: 1.08 mg/dL (ref 0.76–1.27)
Globulin, Total: 2.6 g/dL (ref 1.5–4.5)
Glucose: 95 mg/dL (ref 70–99)
Potassium: 4.3 mmol/L (ref 3.5–5.2)
Sodium: 143 mmol/L (ref 134–144)
Total Protein: 7 g/dL (ref 6.0–8.5)
eGFR: 82 mL/min/{1.73_m2} (ref >59)

## 2023-09-02 LAB — CBC
Hematocrit: 48.3 % (ref 37.5–51.0)
Hemoglobin: 16.5 g/dL (ref 13.0–17.7)
MCH: 32.5 pg (ref 26.6–33.0)
MCHC: 34.2 g/dL (ref 31.5–35.7)
MCV: 95 fL (ref 79–97)
Platelets: 410 10*3/uL (ref 150–450)
RBC: 5.08 x10E6/uL (ref 4.14–5.80)
RDW: 12 % (ref 11.6–15.4)
WBC: 10.5 10*3/uL (ref 3.4–10.8)

## 2023-09-02 LAB — HEMOGLOBIN A1C
Est. average glucose Bld gHb Est-mCnc: 114 mg/dL
Hgb A1c MFr Bld: 5.6 % (ref 4.8–5.6)

## 2023-09-02 LAB — LIPID PANEL
Chol/HDL Ratio: 4.4 {ratio} (ref 0.0–5.0)
Cholesterol, Total: 200 mg/dL — ABNORMAL HIGH (ref 100–199)
HDL: 45 mg/dL (ref >39)
LDL Chol Calc (NIH): 111 mg/dL — ABNORMAL HIGH (ref 0–99)
Triglycerides: 252 mg/dL — ABNORMAL HIGH (ref 0–149)
VLDL Cholesterol Cal: 44 mg/dL — ABNORMAL HIGH (ref 5–40)

## 2023-09-03 NOTE — Progress Notes (Signed)
 Hi Sarge, your LDL cholesterol is slightly elevated.  Triglycerides are elevated but look better than they did 10 months ago which is great.  I am happy with the progress on the lipids just continue to work on focusing on proteins and vegetables and reducing carbs and sweets.  Metabolic panel looks great.  Hemoglobin looks perfect.  Blood counts back to normal.  A1c is 5.6 which is in the normal range.

## 2023-09-17 ENCOUNTER — Encounter: Payer: Self-pay | Admitting: Family Medicine

## 2023-10-19 ENCOUNTER — Encounter: Payer: Self-pay | Admitting: Family Medicine

## 2023-10-19 DIAGNOSIS — B353 Tinea pedis: Secondary | ICD-10-CM

## 2023-10-22 ENCOUNTER — Ambulatory Visit (INDEPENDENT_AMBULATORY_CARE_PROVIDER_SITE_OTHER): Admitting: Podiatry

## 2023-10-22 DIAGNOSIS — B353 Tinea pedis: Secondary | ICD-10-CM | POA: Diagnosis not present

## 2023-10-22 MED ORDER — KETOCONAZOLE 2 % EX CREA: 1.0000 | TOPICAL_CREAM | Freq: Every day | CUTANEOUS | 2 refills | Status: DC

## 2023-10-22 NOTE — Progress Notes (Signed)
  Subjective:  Patient ID: Christian Gregory, male    DOB: [birth year],   MRN: [id number]  No chief complaint on file.   53 y.o. male presents for concern of tine pedis on his left foot that he has been dealing with for a long time. He has tried OTC topicals and prescription medications. Was referred here by Dr. Linford Arnold. They have tried itroconazole and had a culture taken which grew tricosporon mucoides. Currently taking lamisil.  . Denies any other pedal complaints. Denies n/v/f/c.   Past Medical History:  Diagnosis Date   Anxiety    anger problems   Asthma    Chronic kidney disease    Diabetes mellitus without complication (HCC)    prediabetic   Eosinophilic esophagitis    Fatty liver    History of methicillin resistant staphylococcus aureus (MRSA)    Hyperlipidemia    Hypertension    Obesity     Objective:  Physical Exam: Vascular: DP/PT pulses 2/4 bilateral. CFT <3 seconds. Normal hair growth on digits. No edema.  Skin. No lacerations or abrasions bilateral feet. Fourth interspace on left foot with scaling erythema and maceration noted. Very tender and irritated in this area Musculoskeletal: MMT 5/5 bilateral lower extremities in DF, PF, Inversion and Eversion. Deceased ROM in DF of ankle joint.  Neurological: Sensation intact to light touch.   Assessment:   1. Tinea pedis, left      Plan:  Patient was evaluated and treated and all questions answered. -Examined patient -Discussed treatment options for tinea pedis  -Continue lamisil until completion -Culture reviewed and growing tricosporon mucoides.  -Advised on using betadine in between the toes for the next few weeks. After this may use betadine in mornign and ketoconazole at night which was sent to pharmacy.  Return in 2 months for recheck.    Louann Sjogren, DPM

## 2023-10-26 ENCOUNTER — Encounter: Payer: Self-pay | Admitting: Family Medicine

## 2023-11-17 ENCOUNTER — Other Ambulatory Visit: Payer: Self-pay | Admitting: Family Medicine

## 2023-11-17 DIAGNOSIS — F902 Attention-deficit hyperactivity disorder, combined type: Secondary | ICD-10-CM

## 2023-11-17 MED ORDER — AMPHETAMINE-DEXTROAMPHET ER 30 MG PO CP24
30.0000 mg | ORAL_CAPSULE | ORAL | 0 refills | Status: DC
Start: 2023-11-17 — End: 2024-02-24

## 2023-12-24 ENCOUNTER — Ambulatory Visit: Admitting: Podiatry

## 2024-02-09 ENCOUNTER — Other Ambulatory Visit: Payer: Self-pay | Admitting: Family Medicine

## 2024-02-09 DIAGNOSIS — E291 Testicular hypofunction: Secondary | ICD-10-CM

## 2024-02-10 NOTE — Telephone Encounter (Signed)
 Please call pt and let him know that he will need an appointment and labs for refills. Thanks.

## 2024-02-12 NOTE — Telephone Encounter (Signed)
 Pt self scheduled for 7/2

## 2024-02-17 ENCOUNTER — Encounter: Payer: Self-pay | Admitting: Family Medicine

## 2024-02-17 ENCOUNTER — Ambulatory Visit (INDEPENDENT_AMBULATORY_CARE_PROVIDER_SITE_OTHER): Admitting: Family Medicine

## 2024-02-17 VITALS — BP 128/80 | HR 75 | Ht 67.0 in | Wt 210.1 lb

## 2024-02-17 DIAGNOSIS — I10 Essential (primary) hypertension: Secondary | ICD-10-CM

## 2024-02-17 DIAGNOSIS — J329 Chronic sinusitis, unspecified: Secondary | ICD-10-CM

## 2024-02-17 DIAGNOSIS — R0981 Nasal congestion: Secondary | ICD-10-CM

## 2024-02-17 DIAGNOSIS — J4551 Severe persistent asthma with (acute) exacerbation: Secondary | ICD-10-CM | POA: Diagnosis not present

## 2024-02-17 DIAGNOSIS — E099 Drug or chemical induced diabetes mellitus without complications: Secondary | ICD-10-CM

## 2024-02-17 DIAGNOSIS — T380X5D Adverse effect of glucocorticoids and synthetic analogues, subsequent encounter: Secondary | ICD-10-CM | POA: Diagnosis not present

## 2024-02-17 DIAGNOSIS — Z1211 Encounter for screening for malignant neoplasm of colon: Secondary | ICD-10-CM

## 2024-02-17 DIAGNOSIS — B351 Tinea unguium: Secondary | ICD-10-CM | POA: Diagnosis not present

## 2024-02-17 MED ORDER — OLMESARTAN MEDOXOMIL 5 MG PO TABS: 5.0000 mg | ORAL_TABLET | Freq: Every day | ORAL | 1 refills | Status: DC

## 2024-02-17 MED ORDER — TIRZEPATIDE 12.5 MG/0.5ML ~~LOC~~ SOAJ: 12.5000 mg | SUBCUTANEOUS | 1 refills | Status: DC

## 2024-02-17 MED ORDER — ALBUTEROL SULFATE HFA 108 (90 BASE) MCG/ACT IN AERS: 2.0000 | INHALATION_SPRAY | Freq: Four times a day (QID) | RESPIRATORY_TRACT | 3 refills | Status: DC | PRN

## 2024-02-17 MED ORDER — PREDNISONE 10 MG PO TABS: ORAL_TABLET | ORAL | 0 refills | Status: AC

## 2024-02-17 MED ORDER — TERBINAFINE HCL 250 MG PO TABS: 250.0000 mg | ORAL_TABLET | Freq: Every day | ORAL | 1 refills | Status: AC

## 2024-02-17 NOTE — Progress Notes (Signed)
 Established Patient Office Visit  Subjective  Patient ID: Christian Gregory, male    DOB: 02-Nov-1970  Age: 53 y.o. MRN: [id number]  Chief Complaint  Patient presents with   Medical Management of Chronic Issues    HPI  He still having a lot of trouble with his sinuses.  In fact the right side has been so tight and blocked up he can even blow his nose out of that side for the last several weeks.  He is had a lot of pressure.  He would really like to get back on his Nucala .  He was really doing fantastic while he was on it.  Just had more body aches and soreness and joint pain lately.  He has really done a good job in cleaning up his diet he is mostly eating low-carb he does good with his protein intake.  Still drinking 2 sodas per day.  Try to cut back on some snacking at night since he tends to stay up late.  He would like to go up to the 12.5 mg on the Mounjaro  as well.  He is also on it for steroid-induced diabetes.    ROS    Objective:     BP 128/80 (BP Location: Left Arm, Cuff Size: Normal)   Pulse 75   Ht 5' 7 (1.702 m)   Wt 210 lb 1.9 oz (95.3 kg)   SpO2 100%   BMI 32.91 kg/m    Physical Exam Vitals and nursing note reviewed.  Constitutional:      Appearance: Normal appearance.  HENT:     Head: Normocephalic and atraumatic.  Eyes:     Conjunctiva/sclera: Conjunctivae normal.  Cardiovascular:     Rate and Rhythm: Normal rate and regular rhythm.  Pulmonary:     Effort: Pulmonary effort is normal.     Breath sounds: Wheezing present.     Comments: Slight expiratory wheeze at the left lung base in the right upper lung base. Skin:    General: Skin is warm and dry.  Neurological:     Mental Status: He is alert.  Psychiatric:        Mood and Affect: Mood normal.      No results found for any visits on 02/17/24.    The 10-year ASCVD risk score (Arnett DK, et al., 2019) is: 11.3%    Assessment & Plan:   Problem List Items Addressed This Visit        Cardiovascular and Mediastinum   Essential hypertension, benign   Repeat blood pressure much better.  Will continue to monitor.      Relevant Medications   olmesartan  (BENICAR ) 5 MG tablet   Other Relevant Orders   ANCA Titers ( [facility])   CMP14+EGFR   Hemoglobin A1c     Respiratory   Asthma, allergic   Relevant Medications   albuterol  (VENTOLIN  HFA) 108 (90 Base) MCG/ACT inhaler   predniSONE  (DELTASONE ) 10 MG tablet   Other Relevant Orders   ANCA Titers ( [facility])   CMP14+EGFR   Hemoglobin A1c   Ambulatory referral to Allergy      Endocrine   Steroid-induced diabetes (HCC)   Due for A1c today.  Doing well on the Mounjaro  we will bump up to 12.5 mg.  Follow-up in 6 months.      Relevant Medications   olmesartan  (BENICAR ) 5 MG tablet   tirzepatide  (MOUNJARO ) 12.5 MG/0.5ML Pen   Other Relevant Orders   ANCA Titers (  [facility])   CMP14+EGFR   Hemoglobin A1c   Other Visit Diagnoses       Screen for colon cancer    -  Primary   Relevant Orders   Ambulatory referral to Gastroenterology     Onychomycosis of toenail       Relevant Medications   terbinafine  (LAMISIL ) 250 MG tablet     Sinus congestion       Relevant Orders   Ambulatory referral to Allergy      Chronic sinusitis, unspecified location       Relevant Medications   terbinafine  (LAMISIL ) 250 MG tablet   predniSONE  (DELTASONE ) 10 MG tablet   Other Relevant Orders   Ambulatory referral to Allergy       Severe sinus congestion-will treat with course of prednisone .  Really like to get him back in with pulmonology he does have some wheezing on exam today so I did encourage him to use his albuterol  inhaler.  He says he does need a refill.  He is also due for a 1 year recall on his colon cancer screening.  He would prefer to stay somewhere here more locally in [place] instead of going to [place] if at all possible.  Will refer to  digestive health specialists.  Return in about 6 months (around 08/19/2024).    Dorothyann Byars, MD

## 2024-02-17 NOTE — Assessment & Plan Note (Signed)
 Due for A1c today.  Doing well on the Mounjaro  we will bump up to 12.5 mg.  Follow-up in 6 months.

## 2024-02-17 NOTE — Assessment & Plan Note (Signed)
 Repeat blood pressure much better.  Will continue to monitor.

## 2024-02-18 ENCOUNTER — Ambulatory Visit: Payer: Self-pay | Admitting: Family Medicine

## 2024-02-18 LAB — CMP14+EGFR
ALT: 24 IU/L (ref 0–44)
AST: 17 IU/L (ref 0–40)
Albumin: 4.4 g/dL (ref 3.8–4.9)
Alkaline Phosphatase: 50 IU/L (ref 44–121)
BUN/Creatinine Ratio: 13 (ref 9–20)
BUN: 14 mg/dL (ref 6–24)
Bilirubin Total: 0.5 mg/dL (ref 0.0–1.2)
CO2: 22 mmol/L (ref 20–29)
Calcium: 9.8 mg/dL (ref 8.7–10.2)
Chloride: 99 mmol/L (ref 96–106)
Creatinine, Ser: 1.1 mg/dL (ref 0.76–1.27)
Globulin, Total: 2.6 g/dL (ref 1.5–4.5)
Glucose: 100 mg/dL — ABNORMAL HIGH (ref 70–99)
Potassium: 4.6 mmol/L (ref 3.5–5.2)
Sodium: 140 mmol/L (ref 134–144)
Total Protein: 7 g/dL (ref 6.0–8.5)
eGFR: 80 mL/min/{1.73_m2} (ref >59)

## 2024-02-18 LAB — ANCA TITERS
Atypical pANCA: <1:20 {titer}
C-ANCA: <1:20 {titer}
P-ANCA: <1:20 {titer}

## 2024-02-18 LAB — HEMOGLOBIN A1C
Est. average glucose Bld gHb Est-mCnc: 108 mg/dL
Hgb A1c MFr Bld: 5.4 % (ref 4.8–5.6)

## 2024-02-18 NOTE — Progress Notes (Signed)
 Hi Sarge, metabolic panel and A1c look great.  Still waiting for the ANCA titers.

## 2024-02-21 ENCOUNTER — Encounter: Payer: Self-pay | Admitting: Family Medicine

## 2024-02-21 NOTE — Progress Notes (Signed)
 HI Sarge, ANCA tests are negative. We could try send you to allergy  and Asthma and see if they can get the Nucala  approved.

## 2024-02-24 ENCOUNTER — Encounter: Payer: Self-pay | Admitting: Family Medicine

## 2024-02-24 DIAGNOSIS — F902 Attention-deficit hyperactivity disorder, combined type: Secondary | ICD-10-CM

## 2024-02-24 MED ORDER — AMPHETAMINE-DEXTROAMPHET ER 30 MG PO CP24: 30.0000 mg | ORAL_CAPSULE | ORAL | 0 refills | Status: DC

## 2024-02-25 ENCOUNTER — Ambulatory Visit: Admitting: Allergy

## 2024-02-25 DIAGNOSIS — J309 Allergic rhinitis, unspecified: Secondary | ICD-10-CM

## 2024-02-25 NOTE — Progress Notes (Deleted)
 Follow Up Note  RE: Christian Gregory MRN: [id number] DOB: 06-25-1971 Date of Office Visit: 02/25/2024  Referring provider: Alvan Dorothyann BIRCH, * Primary care provider: Alvan Dorothyann BIRCH, MD  Chief Complaint: No chief complaint on file.  History of Present Illness: I had the pleasure of seeing Christian Gregory for a follow up visit at the [facility] on 02/25/2024. He is a 53 y.o. male, who is being followed for asthma, allergic rhinoconjunctivitis and EGPA. His previous allergy  office visit was on 06/24/2022 with Christian Mutter, FNP. Today is a new complaint visit of ***.  Discussed the use of AI scribe software for clinical note transcription with the patient, who gave verbal consent to proceed.  History of Present Illness          09/25/2022 pulmonology visit: Asthma: Present for many years.   Continue Trelegy for ICS/LABA/LAMA therapy due to symptom burden.  Symptoms worse last 3 months off Nucala . To resume shortly once cost issue sorted, pharmacy team contacted. Prednisone  course prescribed today with wheezing on exam, worsened symptoms.    Seasonal allergies: Following with Allergist.  Stopped allergy  shots due to cost.   Dyspnea on exertion: Suspect related to poorly controlled asthma.  See above.   EGPA: Diagnosed clinically at [facility].  ANCA positive in the past.  Significant sinus issues but improved overall.  Continue Nucala .  May need to reengage rheumatology in the future.  1.  Allergen avoidance measures -dust mite, cat, pollens, molds.   2.  Treat and prevent inflammation of airway:               A.  Mepolizumab  injections             B.  Trelegy 200 -1 inhalation 1 time per day             C.  Ryaltris  -2 sprays each nostril twice a day (SP)   3. If needed:               A.  Albuterol  HFA -2 inhalations every 4-6 hours             B.  Pataday  -1 drop each eye 1 time per day             C.  Carbinoxamine  4mg  - 1-2 tablets 1-2 times per day              D.  Ipratropium 0.06% -2 sprays each nostril every 6 hours to dry nose   4.  Begin allergen immunotherapy. Make an appointment for your first injection for about 3 weeks from now  Assessment and Plan: Christian Gregory is a 53 y.o. male with: Assessment and Plan: 1. Asthma, severe persistent, well-controlled   2. Seasonal and perennial allergic rhinitis   3. Seasonal allergic conjunctivitis   4. Polycythemia   5. Eosinophilic granulomatosis with polyangiitis (EGPA) (HCC)    Assessment and Plan              No follow-ups on file.  No orders of the defined types were placed in this encounter.  Lab Orders  No laboratory test(s) ordered today    Diagnostics: Spirometry:  Tracings reviewed. His effort: {Blank single:19197::Good reproducible efforts.,It was hard to get consistent efforts and there is a question as to whether this reflects a maximal maneuver.,Poor effort, data can not be interpreted.} FVC: ***L FEV1: ***L, ***% predicted FEV1/FVC ratio: ***% Interpretation: {Blank single:19197::Spirometry consistent with mild obstructive disease,Spirometry consistent with  moderate obstructive disease,Spirometry consistent with severe obstructive disease,Spirometry consistent with possible restrictive disease,Spirometry consistent with mixed obstructive and restrictive disease,Spirometry uninterpretable due to technique,Spirometry consistent with normal pattern,No overt abnormalities noted given today's efforts}.  Please see scanned spirometry results for details.  Skin Testing: {Blank single:19197::Select foods,Environmental allergy  panel,Environmental allergy  panel and select foods,Food allergy  panel,None,Deferred due to recent antihistamines use}. *** Results discussed with patient/family.   Medication List:  Current Outpatient Medications  Medication Sig Dispense Refill  . albuterol  (VENTOLIN  HFA) 108 (90 Base) MCG/ACT inhaler Inhale 2 puffs into the  lungs every 6 (six) hours as needed for wheezing or shortness of breath. 18 g 3  . amphetamine -dextroamphetamine (ADDERALL XR) 30 MG 24 hr capsule Take 1 capsule (30 mg total) by mouth every morning. 90 capsule 0  . ipratropium (ATROVENT ) 0.06 % nasal spray Place 2 sprays into both nostrils 4 (four) times daily. 15 mL 5  . ketoconazole  (NIZORAL ) 2 % cream Apply 1 Application topically daily. 60 g 2  . metoprolol  succinate (TOPROL -XL) 50 MG 24 hr tablet Take 1 tablet (50 mg total) by mouth daily. 90 tablet 1  . olmesartan  (BENICAR ) 5 MG tablet Take 1 tablet (5 mg total) by mouth daily. 90 tablet 1  . predniSONE  (DELTASONE ) 10 MG tablet Take 4 tablets (40 mg total) by mouth daily with breakfast for 3 days, THEN 3 tablets (30 mg total) daily with breakfast for 3 days, THEN 2 tablets (20 mg total) daily with breakfast for 3 days, THEN 1 tablet (10 mg total) daily with breakfast for 3 days. 30 tablet 0  . terbinafine  (LAMISIL ) 250 MG tablet Take 1 tablet (250 mg total) by mouth daily. 90 tablet 1  . testosterone  cypionate (DEPOTESTOSTERONE CYPIONATE) 200 MG/ML injection INJECT 1 ML (200 MG TOTAL) INTO THE MUSCLE EVERY 14 DAYS 2 mL 0  . tirzepatide  (MOUNJARO ) 12.5 MG/0.5ML Pen Inject 12.5 mg into the skin once a week. 6 mL 1  . TRELEGY ELLIPTA  200-62.5-25 MCG/ACT AEPB TAKE 1 PUFF BY MOUTH EVERY DAY 180 each 3   No current facility-administered medications for this visit.   Allergies: Allergies  Allergen Reactions  . Penicillins Anaphylaxis and Swelling    REACTION: anaphalatic    . Losartan  Other (See Comments)    Dizziness and leg pain  . Lisinopril  Diarrhea    ED   I reviewed his past medical history, social history, family history, and environmental history and no significant changes have been reported from his previous visit.  Review of Systems  Constitutional:  Negative for appetite change, chills, fever and unexpected weight change.  HENT:  Negative for congestion and rhinorrhea.    Eyes:  Negative for itching.  Respiratory:  Negative for cough, chest tightness, shortness of breath and wheezing.   Cardiovascular:  Negative for chest pain.  Gastrointestinal:  Negative for abdominal pain.  Genitourinary:  Negative for difficulty urinating.  Skin:  Negative for rash.  Neurological:  Negative for headaches.    Objective: There were no vitals taken for this visit. There is no height or weight on file to calculate BMI. Physical Exam Vitals and nursing note reviewed.  Constitutional:      Appearance: Normal appearance. He is well-developed.  HENT:     Head: Normocephalic and atraumatic.     Right Ear: Tympanic membrane and external ear normal.     Left Ear: Tympanic membrane and external ear normal.     Nose: Nose normal.     Mouth/Throat:  Mouth: Mucous membranes are moist.     Pharynx: Oropharynx is clear.  Eyes:     Conjunctiva/sclera: Conjunctivae normal.  Cardiovascular:     Rate and Rhythm: Normal rate and regular rhythm.     Heart sounds: Normal heart sounds. No murmur heard.    No friction rub. No gallop.  Pulmonary:     Effort: Pulmonary effort is normal.     Breath sounds: Normal breath sounds. No wheezing, rhonchi or rales.  Musculoskeletal:     Cervical back: Neck supple.  Skin:    General: Skin is warm.     Findings: No rash.  Neurological:     Mental Status: He is alert and oriented to person, place, and time.  Psychiatric:        Behavior: Behavior normal.   Previous notes and tests were reviewed. The plan was reviewed with the patient/family, and all questions/concerned were addressed.  It was my pleasure to see Christian Gregory today and participate in his care. Please feel free to contact me with any questions or concerns.  Sincerely,  Orlan Cramp, DO Allergy  & Immunology  [facility]  [place] office: [phone] [facility] office: [phone]

## 2024-03-31 ENCOUNTER — Ambulatory Visit: Admitting: Family Medicine

## 2024-04-05 ENCOUNTER — Other Ambulatory Visit: Payer: Self-pay | Admitting: Family Medicine

## 2024-05-01 ENCOUNTER — Other Ambulatory Visit: Payer: Self-pay | Admitting: Family Medicine

## 2024-05-01 DIAGNOSIS — E291 Testicular hypofunction: Secondary | ICD-10-CM

## 2024-05-26 ENCOUNTER — Encounter: Payer: Self-pay | Admitting: Family Medicine

## 2024-05-26 DIAGNOSIS — F902 Attention-deficit hyperactivity disorder, combined type: Secondary | ICD-10-CM

## 2024-05-26 MED ORDER — AMPHETAMINE-DEXTROAMPHET ER 30 MG PO CP24: 30.0000 mg | ORAL_CAPSULE | ORAL | 0 refills | Status: DC

## 2024-05-26 NOTE — Telephone Encounter (Signed)
 Requesting rx rf of Adderall XR 30mg   Last written 02/24/2024 Last OV 02/17/2024 Upcoming appt 12/23/225

## 2024-07-08 ENCOUNTER — Encounter: Payer: Self-pay | Admitting: Family Medicine

## 2024-07-08 MED ORDER — ALPRAZOLAM 0.5 MG PO TABS: 0.5000 mg | ORAL_TABLET | Freq: Every evening | ORAL | 0 refills | Status: AC | PRN

## 2024-07-27 ENCOUNTER — Encounter: Payer: Self-pay | Admitting: Family Medicine

## 2024-07-27 ENCOUNTER — Ambulatory Visit: Admitting: Family Medicine

## 2024-07-27 VITALS — BP 130/65 | HR 85 | Ht 67.0 in | Wt 210.3 lb

## 2024-07-27 DIAGNOSIS — R202 Paresthesia of skin: Secondary | ICD-10-CM | POA: Diagnosis not present

## 2024-07-27 DIAGNOSIS — Z1211 Encounter for screening for malignant neoplasm of colon: Secondary | ICD-10-CM

## 2024-07-27 DIAGNOSIS — I10 Essential (primary) hypertension: Secondary | ICD-10-CM

## 2024-07-27 DIAGNOSIS — E118 Type 2 diabetes mellitus with unspecified complications: Secondary | ICD-10-CM | POA: Diagnosis not present

## 2024-07-27 DIAGNOSIS — J339 Nasal polyp, unspecified: Secondary | ICD-10-CM | POA: Diagnosis not present

## 2024-07-27 DIAGNOSIS — J329 Chronic sinusitis, unspecified: Secondary | ICD-10-CM | POA: Diagnosis not present

## 2024-07-27 DIAGNOSIS — Z7985 Long-term (current) use of injectable non-insulin antidiabetic drugs: Secondary | ICD-10-CM | POA: Diagnosis not present

## 2024-07-27 DIAGNOSIS — Z0000 Encounter for general adult medical examination without abnormal findings: Secondary | ICD-10-CM

## 2024-07-27 DIAGNOSIS — J4551 Severe persistent asthma with (acute) exacerbation: Secondary | ICD-10-CM | POA: Diagnosis not present

## 2024-07-27 MED ORDER — DUPIXENT 300 MG/2ML ~~LOC~~ SOSY: 300.0000 mg | PREFILLED_SYRINGE | SUBCUTANEOUS | 5 refills | Status: AC

## 2024-07-27 MED ORDER — ALBUTEROL SULFATE HFA 108 (90 BASE) MCG/ACT IN AERS: 2.0000 | INHALATION_SPRAY | Freq: Four times a day (QID) | RESPIRATORY_TRACT | 3 refills | Status: AC | PRN

## 2024-07-27 MED ORDER — PREDNISONE 20 MG PO TABS: 40.0000 mg | ORAL_TABLET | Freq: Every day | ORAL | 0 refills | Status: AC

## 2024-07-27 MED ORDER — OLMESARTAN MEDOXOMIL 5 MG PO TABS: 5.0000 mg | ORAL_TABLET | Freq: Every day | ORAL | 1 refills | Status: AC

## 2024-07-27 NOTE — Telephone Encounter (Signed)
 Patient in office today. Mrs bascom printed out for Dr. Alvan to sign

## 2024-07-27 NOTE — Progress Notes (Signed)
 Complete physical exam  Patient: Christian Gregory    DOB: 12/15/1970 53 y.o.   MRN: [id number]  Chief Complaint  Patient presents with   Annual Exam    Subjective:    Christian Gregory is a 53 y.o. male who presents today for a complete physical exam. He reports consuming a general diet. Hunts for fun and exercise. He generally feels well.  He does have additional problems to discuss today.   Discussed the use of AI scribe software for clinical note transcription with the patient, who gave verbal consent to proceed.  History of Present Illness Christian Gregory is a 53 year old male who presents for CPE  Arthralgia and joint pain - Significant arthritis pain in the finger, worsened by cold weather - History of fractures on the left hand may contribute to symptoms - Uses Tylenol  Arthritis for pain relief - Ongoing issues with the ankle  Sinus congestion and nasal polyposis - Sinus congestion, predominantly on the right side, attributed to possible polyps - Uses sinus rinse and occasionally takes prednisone  for symptom relief - History of allergy  testing and shots, which were ineffective - History of sinus surgery for polyps, with recurrence of polyps postoperatively - Difficulty breathing, wheezing, and mouth breathing, especially during physical activity - Engages in strenuous physical activity (hunting, carrying heavy loads, climbing trees) despite sinus issues  Respiratory symptoms - Occasional chest soreness and difficulty breathing, attributed to sinus problems - No significant chest pain during physical activity  Left foot numbness and nail changes - Numbness in the left big toe - Concern for potential circulation issues in the left foot, with family history of similar problems - Uses iodine and peroxide for care - Has taken Lamisil  for nail issues   Most recent fall risk assessment:    07/27/2024    8:50 AM  Fall Risk   Falls in the past year? 0  Number falls  in past yr: 0  Injury with Fall? 0  Risk for fall due to : No Fall Risks  Follow up Falls evaluation completed     Most recent depression screenings:    07/27/2024    8:50 AM 02/17/2024    9:44 AM  PHQ 2/9 Scores  PHQ - 2 Score 0 0        Patient Care Team: Alvan Dorothyann BIRCH, MD as PCP - General (Family Medicine)   ROS    Objective:    BP 130/65   Pulse 85   Ht 5' 7 (1.702 m)   Wt 210 lb 4.8 oz (95.4 kg)   SpO2 97%   BMI 32.94 kg/m     Physical Exam Constitutional:      Appearance: Normal appearance.  HENT:     Head: Normocephalic and atraumatic.     Right Ear: Tympanic membrane, ear canal and external ear normal.     Left Ear: Tympanic membrane, ear canal and external ear normal.     Nose: Nose normal.     Mouth/Throat:     Pharynx: Oropharynx is clear.  Eyes:     Extraocular Movements: Extraocular movements intact.     Conjunctiva/sclera: Conjunctivae normal.     Pupils: Pupils are equal, round, and reactive to light.  Neck:     Thyroid: No thyromegaly.  Cardiovascular:     Rate and Rhythm: Normal rate and regular rhythm.  Pulmonary:     Effort: Pulmonary effort is normal.     Breath sounds: Normal breath  sounds.  Abdominal:     General: Bowel sounds are normal.     Palpations: Abdomen is soft.     Tenderness: There is no abdominal tenderness.  Musculoskeletal:        General: No swelling.     Cervical back: Neck supple.  Skin:    General: Skin is warm and dry.  Neurological:     Mental Status: He is oriented to person, place, and time.  Psychiatric:        Mood and Affect: Mood normal.        Behavior: Behavior normal.       No results found for any visits on 07/27/24.      Assessment & Plan:    Routine Health Maintenance and Physical Exam Immunization History  Administered Date(s) Administered   Influenza,inj,Quad PF,6+ Mos 07/07/2017   Influenza-Unspecified 06/01/2018   Tdap 07/20/2018    Health Maintenance  Topic Date  Due   OPHTHALMOLOGY EXAM  Never done   Hepatitis C Screening  Never done   Pneumococcal Vaccine: 50+ Years (1 of 2 - PCV) Never done   Hepatitis B Vaccines 19-59 Average Risk (1 of 3 - 19+ 3-dose series) Never done   Zoster Vaccines- Shingrix (1 of 2) Never done   Colonoscopy  12/29/2023   COVID-19 Vaccine (1 - 2025-26 season) Never done   Diabetic kidney evaluation - Urine ACR  05/21/2024   Influenza Vaccine  11/15/2024 (Originally 03/18/2024)   HEMOGLOBIN A1C  08/19/2024   Diabetic kidney evaluation - eGFR measurement  02/16/2025   FOOT EXAM  07/27/2025   DTaP/Tdap/Td (2 - Td or Tdap) 07/20/2028   HIV Screening  Completed   HPV VACCINES  Aged Out   Meningococcal B Vaccine  Aged Out    Discussed health benefits of physical activity, and encouraged him to engage in regular exercise appropriate for his age and condition.  Problem List Items Addressed This Visit       Respiratory   Asthma, allergic   Relevant Medications   predniSONE  (DELTASONE ) 20 MG tablet   albuterol  (VENTOLIN  HFA) 108 (90 Base) MCG/ACT inhaler     Endocrine   Controlled diabetes mellitus type 2 with complications (HCC)   Relevant Orders   HgB A1c   VAS US  ABI WITH/WO TBI   Other Visit Diagnoses       Routine general medical examination at a health care facility    -  Primary   Relevant Orders   Urine Microalbumin w/creat. 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 Previous allergy  shots were ineffective. Considering alternative treatments due to recurrent polyps and previous surgical intervention being unsuccessful. -  Consider referral to ENT for further evaluation and management of nasal polyps. - Explore alternative treatments such as Dupixent or Fasenra for nasal polyps.  Osteoarthritis of the hand and ankle Osteoarthritis in the hand and ankle, with significant pain exacerbated by cold weather. Current management includes Tylenol  arthritis. Previous orthopedic consultation for ankle issues, with plans for future intervention. - Continue  Tylenol  arthritis for pain management. - Consider referral to [facility] for further evaluation of ankle osteoarthritis.  Peripheral neuropathy and peripheral vascular disease Peripheral neuropathy with numbness in the left big toe. Concerns about peripheral vascular disease due to previous nail issues and numbness. Potential for reduced blood flow contributing to symptoms. - Ordered ankle-brachial index (ABI) to assess blood flow and screen for peripheral vascular disease.  Onychomycosis Persistent onychomycosis with concerns about underlying vascular issues contributing to nail problems. Previous treatment with Lamisil . - Continue Lamisil  for onychomycosis.  General Health Maintenance Routine health maintenance discussed, including foot exam, urine test for proteinuria, and vaccinations. Encouraged to consider Prevnar 20 vaccine due to sinus and lung issues. - Obtained urine sample to check for proteinuria. - Encouraged consideration of Prevnar 20 vaccine.    Return in about 6 months (around 01/25/2025) for Diabetes follow-up, Hypertension.    Dorothyann Byars, MD [facility] at [facility]

## 2024-07-28 ENCOUNTER — Ambulatory Visit: Payer: Self-pay | Admitting: Family Medicine

## 2024-07-28 ENCOUNTER — Other Ambulatory Visit: Payer: Self-pay

## 2024-07-28 ENCOUNTER — Encounter: Payer: Self-pay | Admitting: Family Medicine

## 2024-07-28 DIAGNOSIS — R6889 Other general symptoms and signs: Secondary | ICD-10-CM

## 2024-07-28 LAB — CMP14+EGFR
ALT: 20 IU/L (ref 0–44)
AST: 16 IU/L (ref 0–40)
Albumin: 4.2 g/dL (ref 3.8–4.9)
Alkaline Phosphatase: 50 IU/L (ref 47–123)
BUN/Creatinine Ratio: 8 — ABNORMAL LOW (ref 9–20)
BUN: 9 mg/dL (ref 6–24)
Bilirubin Total: 0.7 mg/dL (ref 0.0–1.2)
CO2: 24 mmol/L (ref 20–29)
Calcium: 9.6 mg/dL (ref 8.7–10.2)
Chloride: 100 mmol/L (ref 96–106)
Creatinine, Ser: 1.06 mg/dL (ref 0.76–1.27)
Globulin, Total: 2.6 g/dL (ref 1.5–4.5)
Glucose: 102 mg/dL — ABNORMAL HIGH (ref 70–99)
Potassium: 4.1 mmol/L (ref 3.5–5.2)
Sodium: 138 mmol/L (ref 134–144)
Total Protein: 6.8 g/dL (ref 6.0–8.5)
eGFR: 83 mL/min/1.73 (ref >59)

## 2024-07-28 LAB — HEMOGLOBIN A1C
Est. average glucose Bld gHb Est-mCnc: 114 mg/dL
Hgb A1c MFr Bld: 5.6 % (ref 4.8–5.6)

## 2024-07-28 LAB — MICROALBUMIN / CREATININE URINE RATIO
Creatinine, Urine: 327.5 mg/dL
Microalb/Creat Ratio: 4 mg/g{creat} (ref 0–29)
Microalbumin, Urine: 13.6 ug/mL

## 2024-07-28 LAB — LIPID PANEL WITH LDL/HDL RATIO
Cholesterol, Total: 220 mg/dL — ABNORMAL HIGH (ref 100–199)
HDL: 47 mg/dL (ref >39)
LDL Chol Calc (NIH): 132 mg/dL — ABNORMAL HIGH (ref 0–99)
LDL/HDL Ratio: 2.8 ratio (ref 0.0–3.6)
Triglycerides: 230 mg/dL — ABNORMAL HIGH (ref 0–149)
VLDL Cholesterol Cal: 41 mg/dL — ABNORMAL HIGH (ref 5–40)

## 2024-07-28 NOTE — Progress Notes (Signed)
 Hi Sarge, your cholesterol is up!!!!  Please work on eating more healthy diet and stay active with 30 min of exercise, 5 days per week.

## 2024-07-29 NOTE — Progress Notes (Signed)
 No excess protein in the urine which is fantastic.

## 2024-08-02 ENCOUNTER — Telehealth: Payer: Self-pay

## 2024-08-02 ENCOUNTER — Other Ambulatory Visit (HOSPITAL_COMMUNITY): Payer: Self-pay

## 2024-08-02 NOTE — Telephone Encounter (Signed)
 Pharmacy Patient Advocate Encounter   Received notification from Onbase that prior authorization for Dupixent  300MG /2ML syringes  is required/requested.   Insurance verification completed.   The patient is insured through [facility].   Per test claim: PA required; PA submitted to above mentioned insurance via Latent Key/confirmation #/EOC [id number] Status is pending

## 2024-08-04 NOTE — Telephone Encounter (Signed)
 Pharmacy Patient Advocate Encounter  Received notification from [facility] that Prior Authorization for Dupixent  300MG /2ML syringes  has been DENIED.  Full denial letter will be uploaded to the media tab. See denial reason below.   PA #/Case ID/Reference #: [id number]

## 2024-08-09 ENCOUNTER — Ambulatory Visit: Admitting: Family Medicine

## 2024-08-24 ENCOUNTER — Ambulatory Visit: Admitting: Urgent Care

## 2024-08-24 ENCOUNTER — Encounter: Payer: Self-pay | Admitting: Family Medicine

## 2024-08-24 VITALS — BP 124/80 | HR 81 | Temp 98.3°F | Ht 67.5 in | Wt 204.0 lb

## 2024-08-24 DIAGNOSIS — J101 Influenza due to other identified influenza virus with other respiratory manifestations: Secondary | ICD-10-CM | POA: Diagnosis not present

## 2024-08-24 DIAGNOSIS — R509 Fever, unspecified: Secondary | ICD-10-CM | POA: Diagnosis not present

## 2024-08-24 LAB — POC SOFIA 2 FLU + SARS ANTIGEN FIA
Influenza A, POC: POSITIVE — AB
Influenza B, POC: NEGATIVE
SARS Coronavirus 2 Ag: NEGATIVE

## 2024-08-24 MED ORDER — XOFLUZA (80 MG DOSE) 1 X 80 MG PO TBPK: 1.0000 | ORAL_TABLET | Freq: Once | ORAL | 0 refills | Status: DC

## 2024-08-24 MED ORDER — XOFLUZA (80 MG DOSE) 1 X 80 MG PO TBPK: 1.0000 | ORAL_TABLET | Freq: Once | ORAL | 0 refills | Status: AC

## 2024-08-24 NOTE — Progress Notes (Signed)
 "  Established Patient Office Visit  Subjective:  Patient ID: Christian Gregory, male    DOB: 18-Jan-1971  Age: 54 y.o. MRN: [id number]  Chief Complaint  Patient presents with   Fever    Patient c/o fever, 99.0 at home , chills , bodyaches, sweats , and feelling delusional x "Sunday 08/22/23 - symptoms are somewhat better today . Patient has taken ibuprofen at 8am  and zicam at 10:15am this morning.     HPI  Discussed the use of AI scribe software for clinical note transcription with the patient, who gave verbal consent to proceed.  History of Present Illness   Christian Gregory Sarge is a 53 year old male who presents with flu symptoms.  He began experiencing flu-like symptoms on Sunday while at work. Upon returning home, he felt unwell and has since remained mostly in bed, only getting up to use the restroom. His symptoms included cold chills, fever, body aches, and a feeling of being 'a little bit delusional'.  He reports a fever measured at 99.5F to 99.6F, though he suspects it may have been higher initially. He continues to experience body aches, a cloudy head feeling, and significant sinus pressure, which he describes as causing a lot of pain in his head. He also has a persistent cough, shortness of breath, and congestion, with mucus production but no blood. No sore throat.  He has been taking Zicam for about a day and a half and has also used ibuprofen to manage his symptoms. His wife has recently developed similar symptoms. He works with many people at his workplace.  No gastrointestinal symptoms such as diarrhea, though he did experience an upset stomach. He has not been sick with similar symptoms for about three years, attributing his previous good health to taking a multivitamin, which he missed for four days prior to falling ill.      Patient Active Problem List   Diagnosis Date Noted   Genetic testing 03/04/2023   Controlled diabetes mellitus type 2 with complications (HCC)  10/28/2022   Encounter for weight management 12/19/2021   Asthma, allergic 09/03/2021   Chronic rhinitis 09/03/2021   Attention deficit hyperactivity disorder (ADHD), combined type 10/12/2020   Irritability 08/31/2020   Abnormal weight gain 02/06/2020   Morbid obesity with body mass index of 40.0-44.9 in adult (HCC) 02/06/2020   Post-traumatic osteoarthritis of right ankle 01/14/2019   Nephrolithiasis 03/31/2018   Eosinophilic vasculitis 01/26/2017   Hyperlipidemia 10/02/2015   Carpal tunnel syndrome, bilateral 08/09/2014   Separated shoulder 07/24/2014   Mood disorder 09/21/2013   Left lumbar radiculopathy 08/25/2013   Steroid-induced diabetes 02/08/2013   Hypogonadism male 09/20/2012   MALLET FINGER, ACQUIRED 05/28/2010   OBESITY 11/01/2007   Essential hypertension, benign 11/01/2007   FATTY LIVER DISEASE 11/01/2007   ESOPHAGEAL STRICTURE 08/30/2007   Past Medical History:  Diagnosis Date   Anxiety    anger problems   Asthma    Chronic kidney disease    Diabetes mellitus without complication (HCC)    prediabetic   Eosinophilic esophagitis    Fatty liver    History of methicillin resistant staphylococcus aureus (MRSA)    Hyperlipidemia    Hypertension    Obesity    Past Surgical History:  Procedure Laterality Date   BACK SURGERY     2019   ESOPHAGEAL DILATION     SHOULDER SURGERY     20" 16   steel plate      R ankle 1994-1995   Social  History[1]    ROS: as noted in HPI  Objective:     BP 124/80 (BP Location: Right Arm, Patient Position: Sitting, Cuff Size: Normal)   Pulse 81   Temp 98.3 F (36.8 C)   Ht 5' 7.5 (1.715 m)   Wt 204 lb (92.5 kg)   SpO2 100%   BMI 31.48 kg/m  BP Readings from Last 3 Encounters:  08/24/24 124/80  07/27/24 130/65  02/17/24 128/80   Wt Readings from Last 3 Encounters:  08/24/24 204 lb (92.5 kg)  07/27/24 210 lb 4.8 oz (95.4 kg)  02/17/24 210 lb 1.9 oz (95.3 kg)      Physical Exam Vitals and nursing note  reviewed. Exam conducted with a chaperone present.  Constitutional:      General: He is not in acute distress.    Appearance: Normal appearance. He is ill-appearing. He is not toxic-appearing or diaphoretic.  HENT:     Head: Normocephalic and atraumatic.     Right Ear: Tympanic membrane, ear canal and external ear normal.     Left Ear: Tympanic membrane, ear canal and external ear normal.     Nose: Congestion and rhinorrhea present.     Mouth/Throat:     Mouth: Mucous membranes are moist.     Pharynx: Oropharynx is clear. No oropharyngeal exudate or posterior oropharyngeal erythema.  Eyes:     General: No scleral icterus.       Right eye: No discharge.        Left eye: No discharge.     Pupils: Pupils are equal, round, and reactive to light.  Cardiovascular:     Rate and Rhythm: Normal rate and regular rhythm.     Heart sounds: No murmur heard.    No gallop.  Pulmonary:     Effort: Pulmonary effort is normal. No respiratory distress.     Breath sounds: Normal breath sounds. No stridor. No wheezing or rhonchi.  Musculoskeletal:     Cervical back: Normal range of motion. No rigidity or tenderness.  Lymphadenopathy:     Cervical: No cervical adenopathy.  Skin:    General: Skin is warm and dry.     Coloration: Skin is not jaundiced.     Findings: No bruising, erythema or rash.  Neurological:     General: No focal deficit present.     Mental Status: He is alert and oriented to person, place, and time.  Psychiatric:        Behavior: Behavior normal.      Results for orders placed or performed in visit on 08/24/24  POC SOFIA 2 FLU + SARS ANTIGEN FIA  Result Value Ref Range   Influenza A, POC Positive (A) Negative   Influenza B, POC Negative Negative   SARS Coronavirus 2 Ag Negative Negative     The 10-year ASCVD risk score (Arnett DK, et al., 2019) is: 12.5%  Assessment & Plan:  Fever, unspecified fever cause -     POC SOFIA 2 FLU + SARS ANTIGEN FIA -     Xofluza  (80  MG Dose); Take 1 tablet by mouth once for 1 dose.  Dispense: 1 each; Refill: 0  Influenza A -     Xofluza  (80 MG Dose); Take 1 tablet by mouth once for 1 dose.  Dispense: 1 each; Refill: 0  Assessment and Plan    Influenza A with respiratory manifestations Acute Influenza A confirmed. Symptoms include cough, congestion, wheezing, and sinus pressure. Tamiflu  not recommended due to symptom duration.  Xofluza  considered effective despite delayed administration. Pt believes he is just slightly over the 48 hour treatment window and would like to proceed with Rx given active symptomatology. - Prescribed Xofluza  as a single-dose treatment. - Provided work note for absence until Friday, with option to return sooner if symptoms improve. - Recommended over-the-counter oscillococcinum for body aches and fatigue. - Recommended quercetin with zinc to boost immune system. - Advised on droplet precautions to prevent spread.       No follow-ups on file.   Benton LITTIE Gave, PA    [1]  Social History Tobacco Use   Smoking status: Never   Smokeless tobacco: Never  Substance Use Topics   Alcohol use: Yes    Comment: occ   Drug use: No   "

## 2024-08-24 NOTE — Patient Instructions (Signed)
 You are positive for influenza. Please start taking the treatment as soon as possible. This will help prevent worsening symptoms, and shorten the course of the flu.  Please alternate Tylenol  and ibuprofen as needed for aches and fever.  Rest.  Drink plenty of water. You are considered contagious until 24 hours after your fever breaks.   Take xofluza  - one tab x 1 dose. Stay hydrated.  Rest.  May try OTC Oscillococcinum for body aches. Quercetin with zinc helps boost your immune system

## 2024-08-25 ENCOUNTER — Encounter: Payer: Self-pay | Admitting: Family Medicine

## 2024-08-25 ENCOUNTER — Encounter: Payer: Self-pay | Admitting: Urgent Care

## 2024-08-25 DIAGNOSIS — F902 Attention-deficit hyperactivity disorder, combined type: Secondary | ICD-10-CM

## 2024-08-26 ENCOUNTER — Other Ambulatory Visit: Payer: Self-pay

## 2024-08-26 ENCOUNTER — Telehealth: Payer: Self-pay

## 2024-08-26 DIAGNOSIS — F902 Attention-deficit hyperactivity disorder, combined type: Secondary | ICD-10-CM

## 2024-08-26 MED ORDER — AMPHETAMINE-DEXTROAMPHET ER 30 MG PO CP24: 30.0000 mg | ORAL_CAPSULE | ORAL | 0 refills | Status: AC

## 2024-08-26 MED ORDER — AMPHETAMINE-DEXTROAMPHET ER 30 MG PO CP24: 30.0000 mg | ORAL_CAPSULE | ORAL | 0 refills | Status: DC

## 2024-08-26 NOTE — Telephone Encounter (Signed)
 Called [facility] to cancel prescription LVM on prescriber line with pt's name,dob,and medication. My name, office phone #.   Will fwd to pcp to [facility] to [facility] here  [address]

## 2024-08-26 NOTE — Telephone Encounter (Signed)
 Please call him and let him know that we did try to get the Dupixent  but unfortunately the insurance denied it.  He did say that they would pay for Xhance  nasal spray.  Not sure what the co-pay would be on that.  But that would be in place of any type of Flonase  or Nasonex product.  If you would like to try it we can see if you feel like it works better than the over-the-counter ones.  Just let me know.

## 2024-08-26 NOTE — Telephone Encounter (Signed)
 Patients medication Adderral got sent to incorrect pharmacy, patient would [phone] [place], [place] - [address]

## 2024-08-26 NOTE — Telephone Encounter (Signed)
 Sent to correct pharmacy.

## 2024-08-26 NOTE — Telephone Encounter (Signed)
 Copied from CRM #[id number]. Topic: Clinical - Prescription Issue >> Aug 26, 2024  2:23 PM Fonda T wrote: Reason for CRM: Pt spouse calling, states medication was sent to incorrect pharmacy.  Requesting medication be resent to preferred pharmacy.  Called and spoke to front desk, states she will inform clinical staff.  Pt spouse advised of above, verbalized understanding.   If need to discuss further, can be reached at [phone].  [phone] [place], [place] - [address] [place] [address] Phone: [phone] Fax: [phone]

## 2024-08-29 NOTE — Telephone Encounter (Signed)
 Patient informed and declines the Medora. States we were just trying to find a replacement for Nucala  but he does not want to try another alternate of Xhanse as he already has a lot of prescribed nasal treatments that do not work.

## 2024-08-30 ENCOUNTER — Ambulatory Visit (HOSPITAL_COMMUNITY)
Admission: RE | Admit: 2024-08-30 | Discharge: 2024-08-30 | Disposition: A | Discharge status: Home / Self Care | Source: Ambulatory Visit | Attending: Family Medicine | Admitting: Family Medicine

## 2024-08-30 DIAGNOSIS — R202 Paresthesia of skin: Secondary | ICD-10-CM | POA: Insufficient documentation

## 2024-08-30 DIAGNOSIS — E118 Type 2 diabetes mellitus with unspecified complications: Secondary | ICD-10-CM | POA: Insufficient documentation

## 2024-08-31 LAB — VAS US ABI WITH/WO TBI
Left ABI: 1.31
Right ABI: 1.34

## 2024-09-02 ENCOUNTER — Other Ambulatory Visit: Payer: Self-pay | Admitting: Family Medicine

## 2024-09-12 NOTE — Telephone Encounter (Signed)
 Patient informed of results and would like to proceed with vein specialist referral.

## 2024-09-12 NOTE — Progress Notes (Signed)
 Hi Sarge,  Your US  shows that the the vessel were stiff but good flow to the toes.  How would you feel about cosider with a vein specialist to see what they think?

## 2024-09-15 NOTE — Telephone Encounter (Signed)
Orders Placed This Encounter  Procedures  . Ambulatory referral to Vascular Surgery    Referral Priority:   Routine    Referral Type:   Surgical    Referral Reason:   Specialty Services Required    Requested Specialty:   Vascular Surgery    Number of Visits Requested:   1

## 2025-01-26 ENCOUNTER — Ambulatory Visit: Admitting: Family Medicine
